# Patient Record
Sex: Female | Born: 1990 | Race: White | Hispanic: No | Smoking: Current every day smoker
Health system: Southern US, Community
[De-identification: ages and names within clinical notes are randomized; demographics above are authoritative.]

## PROBLEM LIST (undated history)

## (undated) DIAGNOSIS — A749 Chlamydial infection, unspecified: Secondary | ICD-10-CM

## (undated) DIAGNOSIS — G8929 Other chronic pain: Secondary | ICD-10-CM

## (undated) DIAGNOSIS — F329 Major depressive disorder, single episode, unspecified: Secondary | ICD-10-CM

## (undated) DIAGNOSIS — F419 Anxiety disorder, unspecified: Secondary | ICD-10-CM

## (undated) DIAGNOSIS — R519 Headache, unspecified: Secondary | ICD-10-CM

## (undated) DIAGNOSIS — F32A Depression, unspecified: Secondary | ICD-10-CM

## (undated) DIAGNOSIS — M542 Cervicalgia: Secondary | ICD-10-CM

## (undated) HISTORY — DX: Major depressive disorder, single episode, unspecified: F32.9

## (undated) HISTORY — DX: Anxiety disorder, unspecified: F41.9

## (undated) HISTORY — DX: Chlamydial infection, unspecified: A74.9

## (undated) HISTORY — DX: Depression, unspecified: F32.A

## (undated) HISTORY — PX: WISDOM TOOTH EXTRACTION: SHX21

---
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
## 2008-07-31 ENCOUNTER — Ambulatory Visit: Payer: Self-pay | Admitting: Family Medicine

## 2009-03-13 ENCOUNTER — Emergency Department: Payer: Self-pay | Admitting: Emergency Medicine

## 2010-05-18 ENCOUNTER — Ambulatory Visit: Payer: Self-pay | Admitting: Family Medicine

## 2010-07-12 ENCOUNTER — Ambulatory Visit: Payer: Self-pay | Admitting: Family Medicine

## 2010-12-23 ENCOUNTER — Ambulatory Visit: Payer: Self-pay | Admitting: Family Medicine

## 2011-09-02 ENCOUNTER — Ambulatory Visit: Payer: Self-pay | Admitting: Internal Medicine

## 2012-06-28 ENCOUNTER — Encounter: Payer: Self-pay | Admitting: Obstetrics and Gynecology

## 2012-07-26 ENCOUNTER — Encounter: Payer: Self-pay | Admitting: Obstetrics & Gynecology

## 2012-08-21 ENCOUNTER — Encounter: Payer: Self-pay | Admitting: Pediatrics

## 2012-09-15 ENCOUNTER — Observation Stay: Payer: Self-pay | Admitting: Obstetrics and Gynecology

## 2012-09-15 LAB — URINALYSIS, COMPLETE
Bilirubin,UR: NEGATIVE
Glucose,UR: NEGATIVE mg/dL (ref 0–75)
Ketone: NEGATIVE
Leukocyte Esterase: NEGATIVE
Ph: 7 (ref 4.5–8.0)
Protein: NEGATIVE
Squamous Epithelial: 3
WBC UR: 2 /HPF (ref 0–5)

## 2012-09-16 LAB — URINE CULTURE

## 2012-12-14 ENCOUNTER — Observation Stay: Payer: Self-pay | Admitting: Obstetrics and Gynecology

## 2012-12-19 ENCOUNTER — Observation Stay: Payer: Self-pay

## 2012-12-20 ENCOUNTER — Inpatient Hospital Stay: Payer: Self-pay

## 2012-12-20 LAB — CBC WITH DIFFERENTIAL/PLATELET
Basophil %: 0.8 %
HCT: 32.8 % — ABNORMAL LOW (ref 35.0–47.0)
Lymphocyte %: 14.5 %
MCH: 30.1 pg (ref 26.0–34.0)
Monocyte #: 1.5 x10 3/mm — ABNORMAL HIGH (ref 0.2–0.9)
Neutrophil #: 11.9 10*3/uL — ABNORMAL HIGH (ref 1.4–6.5)
Neutrophil %: 74.6 %
Platelet: 388 10*3/uL (ref 150–440)
RDW: 14.2 % (ref 11.5–14.5)
WBC: 15.9 10*3/uL — ABNORMAL HIGH (ref 3.6–11.0)

## 2012-12-21 LAB — HEMATOCRIT: HCT: 31.9 % — ABNORMAL LOW (ref 35.0–47.0)

## 2014-02-07 LAB — HM PAP SMEAR: HM Pap smear: NEGATIVE

## 2014-09-09 NOTE — H&P (Signed)
L&D Evaluation:  History:  HPI 38 week Intrauterine pregnancy for Labor check   Presents with contractions   Medications Pre Natal Vitamins   ROS:  ROS No PIH symptoms.   Exam:  Vital Signs stable   Pelvic 3 cm (no change)/50/-3   Mebranes Intact   FHT NST Reactive   FHT Description Variable decelerations   Ucx irregular   Other AFI 16.8 cm; VTX   Impression:  Impression reactive NST, Nomal AFI   Plan:  Plan Labor precautions.   Follow Up Appointment already scheduled. in 1 week   Electronic Signatures: DeFrancesco, Prentice DockerMartin A (MD)  (Signed 15-Aug-14 11:18)  Authored: L&D Evaluation   Last Updated: 15-Aug-14 11:18 by DeFrancesco, Prentice DockerMartin A (MD)

## 2014-09-09 NOTE — H&P (Signed)
L&D Evaluation:  History:  HPI 24yo SWF sent over from office in early labor at [id number] EGA, normal PNC to date, [id number]   Presents with contractions   Patient's Medical History No Chronic Illness   Patient's Surgical History none   Medications Pre Natal Vitamins  Tylenol (Acetaminophen)   Allergies NKDA   Social History tobacco   Family History Non-Contributory   ROS:  ROS All systems were reviewed.  HEENT, CNS, GI, GU, Respiratory, CV, Renal and Musculoskeletal systems were found to be normal.   Exam:  Vital Signs stable   Urine Protein not completed   General no apparent distress   Mental Status clear   Chest clear   Heart normal sinus rhythm   Abdomen gravid, tender with contractions   Estimated Fetal Weight Average for gestational age   Fetal Position vtx   Back no CVAT   Edema no edema   Reflexes 1+   Clonus negative   Pelvic 5/75/-1   Mebranes Ruptured   Description clear   FHT normal rate with no decels   Ucx irregular   Ucx Frequency 5 min   Length of each Contraction 60 seconds   Ucx Pain Scale 6   Impression:  Impression early labor   Plan:  Plan monitor contractions and for cervical change   Electronic Signatures: Ulyses AmorBurr, Tabor Bartram N (CNM)  (Signed 21-Aug-14 15:15)  Authored: L&D Evaluation   Last Updated: 21-Aug-14 15:15 by Ulyses AmorBurr, Stephania Macfarlane N (CNM)

## 2014-12-01 ENCOUNTER — Telehealth: Payer: Self-pay | Admitting: Obstetrics and Gynecology

## 2014-12-01 NOTE — Telephone Encounter (Signed)
WANTS TO TALK TO NURSE ABOUT AXNIETY ISSUES THAT'S KEEPING HER FROM EVERY DAY ACTIVITES, SHE SAID SHE WAS VERY EMOTIONALLY UNSTABLE AT THE MOMENT

## 2014-12-02 NOTE — Telephone Encounter (Signed)
Called pt she would like to be seen Gave pt 12/05/2014 @ 11:00, per pt she is stable at the moment and will contact us if needs something before

## 2014-12-03 ENCOUNTER — Encounter: Payer: Self-pay | Admitting: *Deleted

## 2014-12-04 ENCOUNTER — Encounter: Payer: Self-pay | Admitting: Obstetrics and Gynecology

## 2014-12-04 ENCOUNTER — Ambulatory Visit (INDEPENDENT_AMBULATORY_CARE_PROVIDER_SITE_OTHER): Payer: Medicaid Other | Admitting: Obstetrics and Gynecology

## 2014-12-04 VITALS — BP 104/77 | HR 68 | Ht 66.0 in | Wt 155.9 lb

## 2014-12-04 DIAGNOSIS — F419 Anxiety disorder, unspecified: Secondary | ICD-10-CM

## 2014-12-04 DIAGNOSIS — A499 Bacterial infection, unspecified: Secondary | ICD-10-CM

## 2014-12-04 DIAGNOSIS — N760 Acute vaginitis: Secondary | ICD-10-CM

## 2014-12-04 DIAGNOSIS — B9689 Other specified bacterial agents as the cause of diseases classified elsewhere: Secondary | ICD-10-CM

## 2014-12-04 MED ORDER — FLUOXETINE HCL 10 MG PO CAPS: 10.0000 mg | ORAL_CAPSULE | Freq: Every day | ORAL | Status: DC

## 2014-12-04 MED ORDER — METRONIDAZOLE 0.75 % VA GEL: 1.0000 | Freq: Every day | VAGINAL | Status: DC

## 2014-12-04 NOTE — Patient Instructions (Signed)
Generalized Anxiety Disorder Generalized anxiety disorder (GAD) is a mental disorder. It interferes with life functions, including relationships, work, and school. GAD is different from normal anxiety, which everyone experiences at some point in their lives in response to specific life events and activities. Normal anxiety actually helps us prepare for and get through these life events and activities. Normal anxiety goes away after the event or activity is over.  GAD causes anxiety that is not necessarily related to specific events or activities. It also causes excess anxiety in proportion to specific events or activities. The anxiety associated with GAD is also difficult to control. GAD can vary from mild to severe. People with severe GAD can have intense waves of anxiety with physical symptoms (panic attacks).  SYMPTOMS The anxiety and worry associated with GAD are difficult to control. This anxiety and worry are related to many life events and activities and also occur more days than not for 6 months or longer. People with GAD also have three or more of the following symptoms (one or more in children):  Restlessness.   Fatigue.  Difficulty concentrating.   Irritability.  Muscle tension.  Difficulty sleeping or unsatisfying sleep. DIAGNOSIS GAD is diagnosed through an assessment by your health care provider. Your health care provider will ask you questions aboutyour mood,physical symptoms, and events in your life. Your health care provider may ask you about your medical history and use of alcohol or drugs, including prescription medicines. Your health care provider may also do a physical exam and blood tests. Certain medical conditions and the use of certain substances can cause symptoms similar to those associated with GAD. Your health care provider may refer you to a mental health specialist for further evaluation. TREATMENT The following therapies are usually used to treat GAD:    Medication. Antidepressant medication usually is prescribed for long-term daily control. Antianxiety medicines may be added in severe cases, especially when panic attacks occur.   Talk therapy (psychotherapy). Certain types of talk therapy can be helpful in treating GAD by providing support, education, and guidance. A form of talk therapy called cognitive behavioral therapy can teach you healthy ways to think about and react to daily life events and activities.  Stress managementtechniques. These include yoga, meditation, and exercise and can be very helpful when they are practiced regularly. A mental health specialist can help determine which treatment is best for you. Some people see improvement with one therapy. However, other people require a combination of therapies. Document Released: 08/13/2012 Document Revised: 09/02/2013 Document Reviewed: 08/13/2012 ExitCare Patient Information 2015 [employer]. This information is not intended to replace advice given to you by your health care provider. Make sure you discuss any questions you have with your health care provider.  

## 2014-12-04 NOTE — Progress Notes (Signed)
Subjective:     Patient ID: Veronica Harmon, female   DOB: Jun 25, 1990, 24 y.o.   MRN: [id number]  HPI  Reports worsening anxiety x 2-3 months, now occuring daily with multiple panic attacks over last months, worse at night, affecting her ability to fall asleep, and function. Has to leave where she is and go home when it flares up. Took 1/2 a xanax and made her feel 'drunk' so hasn't taken anymore.  Reports feeling like her entire body is tingling and can't breathe during panic attacks- can last 30 minutes to hours. Denies any exacerbating factors. Review of Systems See HPI Also reports increased vaginal white discharge with odor x 2 weeks    Objective:   Physical Exam A&Ox4 Well groomed female in no apparent distress Appropriate affect Pelvic exam: normal external genitalia, vulva, vagina, cervix, uterus and adnexa, WET MOUNT done - results: KOH done, clue cells, excessive bacteria.     Assessment:     Generalized anxiety with panic episodes BV     Plan:     Counseled on treatment options and will start on Prozac  nightly with RTC in 5 weeks to reassess. Rx for Metrogel sent in  [place], [place]

## 2014-12-05 ENCOUNTER — Ambulatory Visit: Payer: Self-pay | Admitting: Obstetrics and Gynecology

## 2014-12-16 ENCOUNTER — Telehealth: Payer: Self-pay | Admitting: Obstetrics and Gynecology

## 2014-12-16 NOTE — Telephone Encounter (Signed)
Spoke w/pt she is tolerating the medication well, would like to increase to  advised ok Has follow-up appt with MNB

## 2014-12-16 NOTE — Telephone Encounter (Signed)
Patient called requesting a call back from you. Thanks °

## 2014-12-18 NOTE — Telephone Encounter (Signed)
done

## 2014-12-24 ENCOUNTER — Other Ambulatory Visit: Payer: Self-pay | Admitting: *Deleted

## 2014-12-24 ENCOUNTER — Telehealth: Payer: Self-pay | Admitting: Obstetrics and Gynecology

## 2014-12-24 MED ORDER — FLUOXETINE HCL 20 MG PO CAPS: 20.0000 mg | ORAL_CAPSULE | Freq: Every day | ORAL | Status: DC

## 2014-12-24 NOTE — Telephone Encounter (Signed)
PT CALLED AND WANTED YOU TO CALL HER BACK

## 2014-12-24 NOTE — Telephone Encounter (Signed)
rx sent to pharmacy for Prozac , pt is doing much better

## 2015-01-08 ENCOUNTER — Encounter: Payer: Self-pay | Admitting: Obstetrics and Gynecology

## 2015-01-08 ENCOUNTER — Ambulatory Visit (INDEPENDENT_AMBULATORY_CARE_PROVIDER_SITE_OTHER): Payer: Medicaid Other | Admitting: Obstetrics and Gynecology

## 2015-01-08 VITALS — BP 122/77 | HR 88 | Ht 65.0 in | Wt 149.5 lb

## 2015-01-08 DIAGNOSIS — F419 Anxiety disorder, unspecified: Secondary | ICD-10-CM | POA: Diagnosis not present

## 2015-01-08 NOTE — Progress Notes (Signed)
Subjective:     Patient ID: Veronica Harmon, female   DOB: [birth year], 24 y.o.   MRN: [id number]  HPI Seen today for medication recheck, started on prozac 6 weeks ago for panic attacks and anxiety disordefr  Review of Systems States feeling much better since increased dose to , and has only had a few mild panic attacks, is in therapy and feels like that is helping. Has not taken xanax. Desires to continue meds    Objective:   Physical Exam A&O x4, not anxious Thought processes intact    Assessment:     Anxiety- under good control with SSRI     Plan:     Will continue Prozac at 20 mg  For at least 6 months, then re-evaluate prn.  RTC as needed.  Melody Ines Bloomer, CNM

## 2015-04-24 ENCOUNTER — Ambulatory Visit (INDEPENDENT_AMBULATORY_CARE_PROVIDER_SITE_OTHER): Payer: Medicaid Other | Admitting: Obstetrics and Gynecology

## 2015-04-24 ENCOUNTER — Encounter: Payer: Self-pay | Admitting: Obstetrics and Gynecology

## 2015-04-24 VITALS — BP 107/72 | HR 81 | Wt 142.9 lb

## 2015-04-24 DIAGNOSIS — N760 Acute vaginitis: Secondary | ICD-10-CM | POA: Diagnosis not present

## 2015-04-24 DIAGNOSIS — A499 Bacterial infection, unspecified: Secondary | ICD-10-CM | POA: Diagnosis not present

## 2015-04-24 DIAGNOSIS — B9689 Other specified bacterial agents as the cause of diseases classified elsewhere: Secondary | ICD-10-CM

## 2015-04-24 MED ORDER — FLUOXETINE HCL 20 MG PO CAPS: 20.0000 mg | ORAL_CAPSULE | Freq: Every day | ORAL | Status: DC

## 2015-04-24 MED ORDER — METRONIDAZOLE 0.75 % VA GEL: 1.0000 | Freq: Every day | VAGINAL | Status: DC

## 2015-04-24 NOTE — Progress Notes (Signed)
Patient ID: Veronica MessierJessalyn Rolston, female   DOB: 1990-11-16, 24 y.o.   MRN: [id number] CHIEF COMPLAINT/HPI:  24 y.o. female complains of white, copious and malodorous vaginal discharge for 7  day(s). Denies abnormal vaginal bleeding, significant pelvic pain or fever. No UTI symptoms. Sexually active, does not use condoms, no change in partner.  Last unprotected intercourse __ days ago.  Denies history of known exposure to STD or symptoms in partner.  Patient's last menstrual period was 03/26/2015.  No history of STD's.  Review of Systems  Constitutional: Negative for fever and chills Eyes: Negative for visual disturbances Respiratory: Negative for shortness of breath, dyspnea Cardiovascular: Negative for chest pain or palpitations  Gastrointestinal: Negative for vomiting, diarrhea and constipation Genitourinary: Negative for dysuria and urgency Musculoskeletal: Negative for back pain, joint pain, myalgias  Neurological: Negative for dizziness and headaches    Past Medical History: Past Medical History  Diagnosis Date  . Chlamydia infection   . Anxiety   . Depression     Past Surgical History: History reviewed. No pertinent past surgical history.  Obstetrical History: OB History    Gravida Para Term Preterm AB TAB SAB Ectopic Multiple Living   0               Gynecological History: Pertinent Gynecological History: Menses: usually lasting less than 6 days and with minimal cramping Bleeding: none Contraception: Nexplanon DES exposure: denies Blood transfusions: none Sexually transmitted diseases: no past history Previous GYN Procedures: none    Social History: Social History   Social History  . Marital Status: Divorced    Spouse Name: N/A  . Number of Children: N/A  . Years of Education: N/A   Social History Main Topics  . Smoking status: Current Every Day Smoker  . Smokeless tobacco: Never Used  . Alcohol Use: Yes     Comment: occas  . Drug Use: No  . Sexual  Activity: Yes    Birth Control/ Protection: Implant   Other Topics Concern  . None   Social History Narrative    Family History: Family History  Problem Relation Age of Onset  . Cancer Paternal Aunt     breast    Allergies: Allergies  Allergen Reactions  . Allyl Isothiocyanate Hives  . Sulfa Antibiotics         PHYSICAL EXAM: Pelvic - normal external genitalia, vulva, vagina, cervix, uterus and adnexa, WET MOUNT done - results: KOH done, clue cells   Labs: No results found for this or any previous visit (from the past 24 hour(s)). wetprep see above Assessment: There are no active problems to display for this patient. BV Plan:  No orders of the defined types were placed in this encounter.   Clindesse sample given and metrogel rx refilled.  Melody Suzan NailerN Shambley, CNM

## 2015-07-06 ENCOUNTER — Telehealth: Payer: Self-pay | Admitting: Obstetrics and Gynecology

## 2015-07-06 ENCOUNTER — Other Ambulatory Visit: Payer: Medicaid Other

## 2015-07-06 ENCOUNTER — Other Ambulatory Visit: Payer: Self-pay | Admitting: *Deleted

## 2015-07-06 DIAGNOSIS — N926 Irregular menstruation, unspecified: Secondary | ICD-10-CM

## 2015-07-06 NOTE — Telephone Encounter (Signed)
Called pt left detailed message she may come to lab today

## 2015-07-06 NOTE — Telephone Encounter (Signed)
Pt has nexplanon implant and has had symptoms of pregnancy. She has took 3 pregnancy test all positive. She is worried b/c she is on Prozac.. Can she have serum blood test today. She is at work but will get off at 1 pm so if you call you can leave a msg.

## 2015-07-07 ENCOUNTER — Other Ambulatory Visit: Payer: Medicaid Other

## 2015-07-07 ENCOUNTER — Other Ambulatory Visit: Payer: Self-pay

## 2015-07-07 DIAGNOSIS — N926 Irregular menstruation, unspecified: Secondary | ICD-10-CM

## 2015-07-07 LAB — BETA HCG QUANT (REF LAB): hCG Quant: <1 m[IU]/mL

## 2015-08-03 ENCOUNTER — Telehealth: Payer: Self-pay | Admitting: Obstetrics and Gynecology

## 2015-08-03 NOTE — Telephone Encounter (Signed)
Pt needs note for school saying you have a panic disorder. She wants you to call her before you do it.

## 2015-08-04 NOTE — Telephone Encounter (Signed)
Notified pt note for school is ready

## 2015-12-10 ENCOUNTER — Encounter: Payer: Self-pay | Admitting: Obstetrics and Gynecology

## 2015-12-10 ENCOUNTER — Ambulatory Visit (INDEPENDENT_AMBULATORY_CARE_PROVIDER_SITE_OTHER): Payer: Medicaid Other | Admitting: Obstetrics and Gynecology

## 2015-12-10 VITALS — BP 118/78 | HR 85 | Ht 66.0 in | Wt 152.1 lb

## 2015-12-10 DIAGNOSIS — F419 Anxiety disorder, unspecified: Secondary | ICD-10-CM

## 2015-12-10 DIAGNOSIS — N926 Irregular menstruation, unspecified: Secondary | ICD-10-CM

## 2015-12-10 LAB — BETA HCG QUANT (REF LAB)

## 2015-12-10 LAB — POCT URINE PREGNANCY: Preg Test, Ur: NEGATIVE

## 2015-12-10 MED ORDER — ALPRAZOLAM 0.5 MG PO TABS: 0.5000 mg | ORAL_TABLET | Freq: Three times a day (TID) | ORAL | 1 refills | Status: DC | PRN

## 2015-12-10 NOTE — Progress Notes (Signed)
Subjective:     Patient ID: Veronica Harmon, female   DOB: 21-Jul-1990, 25 y.o.   MRN: [id number]  HPI Here for nexplanon replacement, but concerned about possible pregnanncy- feels fine, but to clindamycin x 2 days for preventive therapy after stepping on nail and had 3 + UPT at home last week. LMP was > 3 months ago. But has not had regular menses since Nexplanon insertion 3 years ago.  Review of Systems See above    Objective:   Physical Exam A&Ox4  well groomed female in no distress Blood pressure 118/78, pulse 85, height 5\' 6"  (1.676 m), weight 152 lb 1.6 oz (69 kg). UPT - pelvic exam not indicated    Assessment:     Missed menses with nexplanon use    Plan:     Quant Bhcg obtained today, will return next week for nexplanon replacement if test negative. Reassured.  Veronica Harmon, CNM

## 2016-02-03 ENCOUNTER — Ambulatory Visit (INDEPENDENT_AMBULATORY_CARE_PROVIDER_SITE_OTHER): Payer: Medicaid Other | Admitting: Obstetrics and Gynecology

## 2016-02-03 ENCOUNTER — Encounter: Payer: Self-pay | Admitting: Obstetrics and Gynecology

## 2016-02-03 ENCOUNTER — Other Ambulatory Visit: Payer: Self-pay | Admitting: Obstetrics and Gynecology

## 2016-02-03 VITALS — BP 115/81 | HR 78 | Ht 66.0 in | Wt 157.9 lb

## 2016-02-03 DIAGNOSIS — Z30017 Encounter for initial prescription of implantable subdermal contraceptive: Secondary | ICD-10-CM

## 2016-02-03 DIAGNOSIS — Z3046 Encounter for surveillance of implantable subdermal contraceptive: Secondary | ICD-10-CM | POA: Diagnosis not present

## 2016-02-03 MED ORDER — ALPRAZOLAM 1 MG PO TABS: 1.0000 mg | ORAL_TABLET | Freq: Three times a day (TID) | ORAL | 3 refills | Status: DC | PRN

## 2016-02-03 NOTE — Progress Notes (Signed)
Veronica Harmon is a 25 y.o. year old 292P0 Caucasian female here for Nexplanon removal and reinsertion.  She was given informed consent for removal and reinsertion of her Nexplanon. Her Nexplanon was placed 2014, No LMP recorded., and her pregnancy test today was negative.   Risks/benefits/side effects of Nexplanon have been discussed and her questions have been answered.  Specifically, a failure rate of 05/998 has been reported, with an increased failure rate if pt takes St. John's Wort and/or antiseizure medicaitons.  Veronica MessierJessalyn Bedwell is aware of the common side effect of irregular bleeding, which the incidence of decreases over time.  BP 115/81   Pulse 78   Ht 5\' 6"  (1.676 m)   Wt 157 lb 14.4 oz (71.6 kg)   BMI 25.49 kg/m  No LMP recorded. No results found for this or any previous visit (from the past 24 hour(s)).   Appropriate time out taken. Nexplanon site identified.  Area prepped in usual sterile fashon. Two cc's of 2% lidocaine was used to anesthetize the area. A small stab incision was made right beside the implant on the distal portion.  The Nexplanon rod was grasped using hemostats and removed intact without difficulty.  The area was cleansed again with betadine and the Nexplanon was inserted per manufacturer's recommendations without difficulty.  Steri-strips and a pressure bandage was applied.  There was less than 3 cc blood loss. There were no complications.  The patient tolerated the procedure well.  She was instructed to keep the area clean and dry, remove pressure bandage in 24 hours, and keep insertion site covered with the steri-strips for 3-5 days.  She was given a card indicating date Nexplanon was inserted and date it needs to be removed.   Follow-up PRN problems.  Dajuana Palen Suzan NailerN Emet Rafanan, CNM

## 2016-07-20 ENCOUNTER — Telehealth: Payer: Self-pay | Admitting: Obstetrics and Gynecology

## 2016-07-20 NOTE — Telephone Encounter (Signed)
Pt states has 2 pills left and zero refills remain. Pt uses [facility]. Does pt need appt first? pleare advise.

## 2016-07-21 ENCOUNTER — Telehealth: Payer: Self-pay | Admitting: *Deleted

## 2016-07-21 ENCOUNTER — Other Ambulatory Visit: Payer: Self-pay | Admitting: *Deleted

## 2016-07-21 MED ORDER — ALPRAZOLAM 1 MG PO TABS: 1.0000 mg | ORAL_TABLET | Freq: Three times a day (TID) | ORAL | 3 refills | Status: DC | PRN

## 2016-07-21 MED ORDER — FLUOXETINE HCL 20 MG PO CAPS: 20.0000 mg | ORAL_CAPSULE | Freq: Every day | ORAL | 3 refills | Status: DC

## 2016-07-21 NOTE — Telephone Encounter (Signed)
Done-ac 

## 2016-07-21 NOTE — Telephone Encounter (Signed)
Patient called and the medication she needs refill of is the Prozac and the xanax . Please advise. Thank you .

## 2016-09-02 ENCOUNTER — Other Ambulatory Visit: Payer: Medicaid Other

## 2016-09-02 ENCOUNTER — Telehealth: Payer: Self-pay | Admitting: Obstetrics and Gynecology

## 2016-09-02 ENCOUNTER — Other Ambulatory Visit: Payer: Self-pay | Admitting: *Deleted

## 2016-09-02 DIAGNOSIS — N926 Irregular menstruation, unspecified: Secondary | ICD-10-CM

## 2016-09-02 NOTE — Telephone Encounter (Signed)
Called pt she is coming in 09/02/16 @ 1:45

## 2016-09-02 NOTE — Telephone Encounter (Signed)
Patient called wanting a (blood pregnancy test) Patient has taken several at home pregnancy tests and all of the results have "faint lines", Patient wants to confirm possible pregnancy as soon as possible. Please advise.

## 2016-09-03 LAB — BETA HCG QUANT (REF LAB)

## 2016-09-05 ENCOUNTER — Telehealth: Payer: Self-pay | Admitting: Obstetrics and Gynecology

## 2016-09-05 NOTE — Telephone Encounter (Signed)
Please call patient with lab results

## 2016-09-06 NOTE — Telephone Encounter (Signed)
Notified pt of results 

## 2016-09-13 ENCOUNTER — Telehealth: Payer: Self-pay | Admitting: Obstetrics and Gynecology

## 2016-09-13 NOTE — Telephone Encounter (Signed)
Patient called requesting a dosage increase on prozac. Please Advise.

## 2016-09-13 NOTE — Telephone Encounter (Signed)
pls advise

## 2016-09-14 ENCOUNTER — Other Ambulatory Visit: Payer: Self-pay | Admitting: Obstetrics and Gynecology

## 2016-09-14 MED ORDER — FLUOXETINE HCL 20 MG PO CAPS: 20.0000 mg | ORAL_CAPSULE | Freq: Every day | ORAL | 3 refills | Status: DC

## 2016-09-14 MED ORDER — FLUOXETINE HCL 10 MG PO CAPS: 10.0000 mg | ORAL_CAPSULE | Freq: Every day | ORAL | 4 refills | Status: DC

## 2016-09-14 NOTE — Telephone Encounter (Signed)
Please let her know I sent in another RX for 30mg  total, which means she will have to take 2 pills a day, one 20mg  and one 10mg .

## 2016-09-14 NOTE — Telephone Encounter (Signed)
Notified pt. 

## 2016-11-29 ENCOUNTER — Telehealth: Payer: Self-pay | Admitting: Obstetrics and Gynecology

## 2016-11-29 ENCOUNTER — Other Ambulatory Visit: Payer: Self-pay | Admitting: Obstetrics and Gynecology

## 2016-11-29 NOTE — Telephone Encounter (Signed)
Patient lvm stating that she is "Mel's" patient, and a call back number, I called the patient back and lvm to let the patient know to give us a call back, so we are able to help her with what ever she may have any concerns with. Thank you.

## 2016-12-05 ENCOUNTER — Other Ambulatory Visit: Payer: Self-pay | Admitting: *Deleted

## 2016-12-05 ENCOUNTER — Telehealth: Payer: Self-pay | Admitting: Obstetrics and Gynecology

## 2016-12-05 MED ORDER — ALPRAZOLAM 1 MG PO TABS: 1.0000 mg | ORAL_TABLET | Freq: Three times a day (TID) | ORAL | 3 refills | Status: DC | PRN

## 2016-12-05 NOTE — Telephone Encounter (Signed)
Patient lvm to get a medication refill, Patient would like to be called back by a nurse. Patient did not disclose any other information. Please advise.

## 2016-12-05 NOTE — Telephone Encounter (Signed)
Patient needs refill on [facility]anax at [facility]

## 2016-12-05 NOTE — Telephone Encounter (Signed)
Put on mns desk for approval

## 2017-03-06 ENCOUNTER — Other Ambulatory Visit: Payer: Self-pay | Admitting: Obstetrics and Gynecology

## 2017-03-23 ENCOUNTER — Emergency Department
Admission: EM | Admit: 2017-03-23 | Discharge: 2017-03-23 | Disposition: A | Discharge status: Home / Self Care | Payer: Medicaid Other | Attending: Emergency Medicine | Admitting: Emergency Medicine

## 2017-03-23 ENCOUNTER — Encounter: Payer: Self-pay | Admitting: Emergency Medicine

## 2017-03-23 ENCOUNTER — Other Ambulatory Visit: Payer: Self-pay

## 2017-03-23 DIAGNOSIS — E860 Dehydration: Secondary | ICD-10-CM | POA: Insufficient documentation

## 2017-03-23 DIAGNOSIS — R112 Nausea with vomiting, unspecified: Secondary | ICD-10-CM

## 2017-03-23 DIAGNOSIS — Z79899 Other long term (current) drug therapy: Secondary | ICD-10-CM | POA: Insufficient documentation

## 2017-03-23 DIAGNOSIS — F17210 Nicotine dependence, cigarettes, uncomplicated: Secondary | ICD-10-CM | POA: Diagnosis not present

## 2017-03-23 DIAGNOSIS — R1110 Vomiting, unspecified: Secondary | ICD-10-CM | POA: Diagnosis present

## 2017-03-23 DIAGNOSIS — R197 Diarrhea, unspecified: Secondary | ICD-10-CM

## 2017-03-23 LAB — LIPASE, BLOOD: Lipase: 20 U/L (ref 11–51)

## 2017-03-23 LAB — CBC
HCT: 43.3 % (ref 35.0–47.0)
Hemoglobin: 14.9 g/dL (ref 12.0–16.0)
MCH: 31.3 pg (ref 26.0–34.0)
MCHC: 34.4 g/dL (ref 32.0–36.0)
MCV: 91.2 fL (ref 80.0–100.0)
Platelets: 278 10*3/uL (ref 150–440)
RBC: 4.75 MIL/uL (ref 3.80–5.20)
RDW: 12.5 % (ref 11.5–14.5)
WBC: 11.3 10*3/uL — ABNORMAL HIGH (ref 3.6–11.0)

## 2017-03-23 LAB — COMPREHENSIVE METABOLIC PANEL
ALBUMIN: 4.2 g/dL (ref 3.5–5.0)
ALK PHOS: 103 U/L (ref 38–126)
ALT: 10 U/L — AB (ref 14–54)
AST: 17 U/L (ref 15–41)
Anion gap: 10 (ref 5–15)
BILIRUBIN TOTAL: 1.6 mg/dL — AB (ref 0.3–1.2)
BUN: 13 mg/dL (ref 6–20)
CALCIUM: 8.7 mg/dL — AB (ref 8.9–10.3)
CO2: 20 mmol/L — ABNORMAL LOW (ref 22–32)
Chloride: 105 mmol/L (ref 101–111)
Creatinine, Ser: 0.85 mg/dL (ref 0.44–1.00)
GFR calc Af Amer: >60 mL/min (ref >60)
GFR calc non Af Amer: >60 mL/min (ref >60)
GLUCOSE: 117 mg/dL — AB (ref 65–99)
Potassium: 3.4 mmol/L — ABNORMAL LOW (ref 3.5–5.1)
Sodium: 135 mmol/L (ref 135–145)
TOTAL PROTEIN: 6.9 g/dL (ref 6.5–8.1)

## 2017-03-23 LAB — HCG, QUANTITATIVE, PREGNANCY

## 2017-03-23 MED ORDER — SODIUM CHLORIDE 0.9 % IV BOLUS (SEPSIS)
1000.0000 mL | Freq: Once | INTRAVENOUS | Status: AC
  Administered 2017-03-23: 1000 mL via INTRAVENOUS

## 2017-03-23 MED ORDER — LOPERAMIDE HCL 2 MG PO CAPS
2.0000 mg | ORAL_CAPSULE | Freq: Once | ORAL | Status: AC
  Administered 2017-03-23: 2 mg via ORAL
  Filled 2017-03-23: qty 1

## 2017-03-23 MED ORDER — ONDANSETRON 4 MG PO TBDP: 4.0000 mg | ORAL_TABLET | Freq: Three times a day (TID) | ORAL | 0 refills | Status: DC | PRN

## 2017-03-23 MED ORDER — ONDANSETRON HCL 4 MG/2ML IJ SOLN
4.0000 mg | Freq: Once | INTRAMUSCULAR | Status: AC
  Administered 2017-03-23: 4 mg via INTRAVENOUS
  Filled 2017-03-23: qty 2

## 2017-03-23 NOTE — Discharge Instructions (Signed)
Please make sure you remain well-hydrated and take your nausea medication as needed for severe symptoms.  I fully anticipate that your symptoms should be gone within the next 24 hours.  If they last longer please return to the emergency department for further evaluation.  Return to the ER sooner for any concerns whatsoever.  It was a pleasure to take care of you today, and thank you for coming to our emergency department.  If you have any questions or concerns before leaving please ask the nurse to grab me and I'm more than happy to go through your aftercare instructions again.  If you were prescribed any opioid pain medication today such as Norco, Vicodin, Percocet, morphine, hydrocodone, or oxycodone please make sure you do not drive when you are taking this medication as it can alter your ability to drive safely.  If you have any concerns once you are home that you are not improving or are in fact getting worse before you can make it to your follow-up appointment, please do not hesitate to call 911 and come back for further evaluation.  Merrily BrittleNeil Chandni Gagan, MD  Results for orders placed or performed during the hospital encounter of 03/23/17  Lipase, blood  Result Value Ref Range   Lipase 20 11 - 51 U/L  Comprehensive metabolic panel  Result Value Ref Range   Sodium 135 135 - 145 mmol/L   Potassium 3.4 (L) 3.5 - 5.1 mmol/L   Chloride 105 101 - 111 mmol/L   CO2 20 (L) 22 - 32 mmol/L   Glucose, Bld 117 (H) 65 - 99 mg/dL   BUN 13 6 - 20 mg/dL   Creatinine, Ser 7.420.85 0.44 - 1.00 mg/dL   Calcium 8.7 (L) 8.9 - 10.3 mg/dL   Total Protein 6.9 6.5 - 8.1 g/dL   Albumin 4.2 3.5 - 5.0 g/dL   AST 17 15 - 41 U/L   ALT 10 (L) 14 - 54 U/L   Alkaline Phosphatase 103 38 - 126 U/L   Total Bilirubin 1.6 (H) 0.3 - 1.2 mg/dL   GFR calc non Af Amer >60 >60 mL/min   GFR calc Af Amer >60 >60 mL/min   Anion gap 10 5 - 15  CBC  Result Value Ref Range   WBC 11.3 (H) 3.6 - 11.0 K/uL   RBC 4.75 3.80 - 5.20  MIL/uL   Hemoglobin 14.9 12.0 - 16.0 g/dL   HCT 59.543.3 63.835.0 - 75.647.0 %   MCV 91.2 80.0 - 100.0 fL   MCH 31.3 26.0 - 34.0 pg   MCHC 34.4 32.0 - 36.0 g/dL   RDW 43.312.5 29.511.5 - 18.814.5 %   Platelets 278 150 - 440 K/uL  hCG, quantitative, pregnancy  Result Value Ref Range   hCG, Beta Chain, Quant, S <1 <5 mIU/mL

## 2017-03-23 NOTE — ED Triage Notes (Signed)
Nausea, vomiting and diarrhea x 2 days. Lower abd pain.

## 2017-03-23 NOTE — ED Notes (Signed)

## 2017-03-23 NOTE — ED Provider Notes (Signed)
[facility] Emergency Department Provider Note  ____________________________________________   First MD Initiated Contact with Patient 03/23/17 2026     (approximate)  I have reviewed the triage vital signs and the nursing notes.   HISTORY  Chief Complaint Abdominal Pain and Emesis    HPI Veronica MessierJessalyn Harmon is a 26 y.o. female who self presents to the emergency department with roughly 48 hours of nausea vomiting diarrhea and mild to moderate cramping abdominal discomfort.  Her niece and nephew at home had identical symptoms several days before.  She vomited roughly 6-7 times today and has had 6-7 does not remember the last time she had antibiotic.  Today her symptoms were improved after receiving IV fluids.  Shortly after arriving to the emergency department she stood up suddenly and had a syncopal event.  She is concerned because she has not been able to keep food down and she is worried she needs IV fluids.  Her symptoms began insidiously and has been actually slowly improving with time.  Past Medical History:  Diagnosis Date  . Anxiety   . Chlamydia infection   . Depression     Patient Active Problem List   Diagnosis Date Noted  . Anxiety 12/10/2015    History reviewed. No pertinent surgical history.  Prior to Admission medications   Medication Sig Start Date End Date Taking? Authorizing Provider  acyclovir (ZOVIRAX) 400 MG tablet Take 400 mg by mouth 2 (two) times daily.    [provider]  ALPRAZolam Prudy Feeler(XANAX) 1 MG tablet Take 1 tablet (1 mg total) by mouth 3 (three) times daily as needed. 12/05/16   Shambley, Melody N, CNM  etonogestrel (NEXPLANON) 68 MG IMPL implant 1 each by Subdermal route once.    [provider]  FLUoxetine (PROZAC) 10 MG capsule TAKE ONE CAPSULE BY MOUTH EVERY DAY 03/07/17   Shambley, Melody N, CNM  FLUoxetine (PROZAC) 20 MG capsule Take 1 capsule (20 mg total) by mouth daily. 09/14/16   Shambley, Melody N, CNM    ISOtretinoin (ACCUTANE) 20 MG capsule Take 20 mg by mouth 2 (two) times daily.    [provider]  metroNIDAZOLE (METROGEL) 0.75 % vaginal gel Place 1 Applicatorful vaginally at bedtime. Apply one applicatorful to vagina at bedtime for 5 days Patient not taking: Reported on 02/03/2016 04/24/15   Shambley, Melody N, CNM  ondansetron (ZOFRAN ODT) 4 MG disintegrating tablet Take 1 tablet (4 mg total) by mouth every 8 (eight) hours as needed for nausea or vomiting. 03/23/17   Merrily Brittleifenbark, [name], MD    Allergies Allyl isothiocyanate and Sulfa antibiotics  Family History  Problem Relation Age of Onset  . Cancer Paternal Aunt        breast    Social History Social History   Tobacco Use  . Smoking status: Current Every Day Smoker    Packs/day: 0.50    Types: Cigarettes  . Smokeless tobacco: Never Used  Substance Use Topics  . Alcohol use: Yes    Comment: occas  . Drug use: No    Review of Systems Constitutional: No fever/chills Eyes: No visual changes. ENT: No sore throat. Cardiovascular: Denies chest pain. Respiratory: Denies shortness of breath. Gastrointestinal: Positive for abdominal pain.  Positive for nausea, positive for vomiting.  Positive for diarrhea.  No constipation. Genitourinary: Negative for dysuria. Musculoskeletal: Negative for back pain. Skin: Negative for rash. Neurological: Negative for headaches, focal weakness or numbness.   ____________________________________________   PHYSICAL EXAM:  VITAL SIGNS: ED Triage  Vitals  Enc Vitals Group     BP 03/23/17 1823 108/68     Pulse Rate 03/23/17 1823 64     Resp 03/23/17 1823 20     Temp 03/23/17 1823 99.1 F (37.3 C)     Temp Source 03/23/17 1823 Oral     SpO2 03/23/17 1823 97 %     Weight 03/23/17 1824 160 lb (72.6 kg)     Height 03/23/17 1824 5\' 6"  (1.676 m)     Head Circumference --      Peak Flow --      Pain Score 03/23/17 1823 3     Pain Loc --      Pain Edu? --      Excl. in GC? --      Constitutional: Alert and oriented x4 pleasant cooperative speaks full clear sentences no diaphoresis Eyes: PERRL EOMI. Head: Atraumatic. Nose: No congestion/rhinnorhea. Mouth/Throat: No trismus Neck: No stridor.   Cardiovascular: Normal rate, regular rhythm. Grossly normal heart sounds.  Good peripheral circulation. Respiratory: Normal respiratory effort.  No retractions. Lungs CTAB and moving good air Gastrointestinal: Soft nondistended nontender no rebound or guarding no peritonitis no McBurney's tenderness no dressings no costovertebral tenderness Musculoskeletal: No lower extremity edema   Neurologic:  Normal speech and language. No gross focal neurologic deficits are appreciated. Skin:  Skin is warm, dry and intact. No rash noted. Psychiatric: Mood and affect are normal. Speech and behavior are normal.    ____________________________________________   DIFFERENTIAL includes but not limited to  Appendicitis, diverticulitis, ectopic pregnancy, pyelonephritis, ovarian torsion, gastroenteritis ____________________________________________   LABS (all labs ordered are listed, but only abnormal results are displayed)  Labs Reviewed  COMPREHENSIVE METABOLIC PANEL - Abnormal; Notable for the following components:      Result Value   Potassium 3.4 (*)    CO2 20 (*)    Glucose, Bld 117 (*)    Calcium 8.7 (*)    ALT 10 (*)    Total Bilirubin 1.6 (*)    All other components within normal limits  CBC - Abnormal; Notable for the following components:   WBC 11.3 (*)    All other components within normal limits  LIPASE, BLOOD  HCG, QUANTITATIVE, PREGNANCY    Blood work reviewed by me shows clinically insignificant hypokalemia.  Elevated white count is nonspecific likely secondary to  stress __________________________________________  EKG   ____________________________________________  RADIOLOGY   ____________________________________________   PROCEDURES  Procedure(s) performed: no  Procedures  Critical Care performed: no  Observation: no ____________________________________________   INITIAL IMPRESSION / ASSESSMENT AND PLAN / ED COURSE  Pertinent labs & imaging results that were available during my care of the patient were reviewed by me and considered in my medical decision making (see chart for details).  By the time I saw the patient she had already had a liter of fluids and felt markedly improved.  Given an additional liter of fluid and Zofran and she feels back to her normal self.  At this point she likely has a self-limited viral illness and will be discharged home with symptomatic treatment and primary care follow-up.  Strict return precautions have been given and patient verbalized understanding and agreed with the plan.      ____________________________________________   FINAL CLINICAL IMPRESSION(S) / ED DIAGNOSES  Final diagnoses:  Nausea vomiting and diarrhea  Dehydration      NEW MEDICATIONS STARTED DURING THIS VISIT:  This SmartLink is deprecated. Use AVSMEDLIST instead to display the medication list  for a patient.   Note:  This document was prepared using Dragon voice recognition software and may include unintentional dictation errors.     Merrily Brittle, MD 03/24/17 1123

## 2017-05-09 ENCOUNTER — Other Ambulatory Visit: Payer: Self-pay

## 2017-05-09 ENCOUNTER — Emergency Department
Admission: EM | Admit: 2017-05-09 | Discharge: 2017-05-09 | Disposition: A | Discharge status: Home / Self Care | Payer: Medicaid Other | Attending: Emergency Medicine | Admitting: Emergency Medicine

## 2017-05-09 ENCOUNTER — Encounter: Payer: Self-pay | Admitting: *Deleted

## 2017-05-09 DIAGNOSIS — F17210 Nicotine dependence, cigarettes, uncomplicated: Secondary | ICD-10-CM | POA: Insufficient documentation

## 2017-05-09 DIAGNOSIS — F419 Anxiety disorder, unspecified: Secondary | ICD-10-CM | POA: Insufficient documentation

## 2017-05-09 DIAGNOSIS — M436 Torticollis: Secondary | ICD-10-CM | POA: Diagnosis not present

## 2017-05-09 DIAGNOSIS — M542 Cervicalgia: Secondary | ICD-10-CM | POA: Diagnosis present

## 2017-05-09 DIAGNOSIS — Z79899 Other long term (current) drug therapy: Secondary | ICD-10-CM | POA: Diagnosis not present

## 2017-05-09 DIAGNOSIS — F329 Major depressive disorder, single episode, unspecified: Secondary | ICD-10-CM | POA: Insufficient documentation

## 2017-05-09 DIAGNOSIS — M5412 Radiculopathy, cervical region: Secondary | ICD-10-CM | POA: Insufficient documentation

## 2017-05-09 MED ORDER — MELOXICAM 15 MG PO TABS: 15.0000 mg | ORAL_TABLET | Freq: Every day | ORAL | 0 refills | Status: DC

## 2017-05-09 MED ORDER — METHOCARBAMOL 500 MG PO TABS
1000.0000 mg | ORAL_TABLET | Freq: Once | ORAL | Status: AC
  Administered 2017-05-09: 1000 mg via ORAL
  Filled 2017-05-09: qty 2

## 2017-05-09 MED ORDER — METHOCARBAMOL 500 MG PO TABS: 500.0000 mg | ORAL_TABLET | Freq: Four times a day (QID) | ORAL | 0 refills | Status: DC

## 2017-05-09 MED ORDER — KETOROLAC TROMETHAMINE 30 MG/ML IJ SOLN
30.0000 mg | Freq: Once | INTRAMUSCULAR | Status: AC
  Administered 2017-05-09: 30 mg via INTRAMUSCULAR
  Filled 2017-05-09: qty 1

## 2017-05-09 MED ORDER — PREDNISONE 50 MG PO TABS: 50.0000 mg | ORAL_TABLET | Freq: Every day | ORAL | 0 refills | Status: DC

## 2017-05-09 NOTE — ED Triage Notes (Signed)
Pt has neck and upper back pain .  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  Pt took flexeril and motrin today without relief.  Pt alert.

## 2017-05-09 NOTE — ED Provider Notes (Signed)
[facility] Emergency Department Provider Note  ____________________________________________  Time seen: Approximately 11:45 PM  I have reviewed the triage vital signs and the nursing notes.   HISTORY  Chief Complaint Neck Pain    HPI Veronica Harmon is a 27 y.o. female who presents emergency department complaining of left-sided neck pain with radicular symptoms on the left arm.  Patient reports that she has ongoing neck and back pain with intermittent spasms and radicular symptoms.  Patient denies any recent trauma.  She denies any fevers or chills.  She reports that she woke up with left-sided neck stiffness and throughout the day she has experienced numbness and tingling down to her elbow.  No progression down to her hand.  She is taken 1 dose of Flexeril and Motrin without significant improvement.  Patient denies any headache, visual changes, chest pain, shortness of breath, abdominal pain, nausea vomiting.  Past Medical History:  Diagnosis Date  . Anxiety   . Chlamydia infection   . Depression     Patient Active Problem List   Diagnosis Date Noted  . Anxiety 12/10/2015    No past surgical history on file.  Prior to Admission medications   Medication Sig Start Date End Date Taking? Authorizing Provider  acyclovir (ZOVIRAX) 400 MG tablet Take 400 mg by mouth 2 (two) times daily.    [provider]  ALPRAZolam Prudy Feeler) 1 MG tablet Take 1 tablet (1 mg total) by mouth 3 (three) times daily as needed. 12/05/16   Shambley, Melody N, CNM  etonogestrel (NEXPLANON) 68 MG IMPL implant 1 each by Subdermal route once.    [provider]  FLUoxetine (PROZAC) 10 MG capsule TAKE ONE CAPSULE BY MOUTH EVERY DAY 03/07/17   Shambley, Melody N, CNM  FLUoxetine (PROZAC) 20 MG capsule Take 1 capsule (20 mg total) by mouth daily. 09/14/16   Shambley, Melody N, CNM  ISOtretinoin (ACCUTANE) 20 MG capsule Take 20 mg by mouth 2 (two) times daily.    [provider]  meloxicam (MOBIC) 15 MG tablet Take 1 tablet (15 mg total) by mouth daily. 05/09/17   Cuthriell, Delorise Royals, PA-C  methocarbamol (ROBAXIN) 500 MG tablet Take 1 tablet (500 mg total) by mouth 4 (four) times daily. 05/09/17   Cuthriell, Delorise Royals, PA-C  metroNIDAZOLE (METROGEL) 0.75 % vaginal gel Place 1 Applicatorful vaginally at bedtime. Apply one applicatorful to vagina at bedtime for 5 days Patient not taking: Reported on 02/03/2016 04/24/15   Shambley, Melody N, CNM  ondansetron (ZOFRAN ODT) 4 MG disintegrating tablet Take 1 tablet (4 mg total) by mouth every 8 (eight) hours as needed for nausea or vomiting. 03/23/17   Merrily Brittle, MD  predniSONE (DELTASONE) 50 MG tablet Take 1 tablet (50 mg total) by mouth daily with breakfast. 05/09/17   Cuthriell, Delorise Royals, PA-C    Allergies Allyl isothiocyanate and Sulfa antibiotics  Family History  Problem Relation Age of Onset  . Cancer Paternal Aunt        breast    Social History Social History   Tobacco Use  . Smoking status: Current Every Day Smoker    Packs/day: 0.50    Types: Cigarettes  . Smokeless tobacco: Never Used  Substance Use Topics  . Alcohol use: Yes    Comment: occas  . Drug use: No     Review of Systems  Constitutional: No fever/chills Eyes: No visual changes.  Cardiovascular: no chest pain. Respiratory: no cough. No SOB. Gastrointestinal: No abdominal pain.  No nausea, no vomiting.   Musculoskeletal: Positive for left-sided neck pain, stiffness, with radicular symptoms down the left arm. Skin: Negative for rash, abrasions, lacerations, ecchymosis. Neurological: Negative for headaches, focal weakness or numbness. 10-point ROS otherwise negative.  ____________________________________________   PHYSICAL EXAM:  VITAL SIGNS: ED Triage Vitals  Enc Vitals Group     BP 05/09/17 2006 125/75     Pulse Rate 05/09/17 2006 100     Resp 05/09/17 2006 16     Temp 05/09/17 2006 98.9 F (37.2 C)      Temp Source 05/09/17 2006 Oral     SpO2 05/09/17 2006 97 %     Weight 05/09/17 2007 160 lb (72.6 kg)     Height 05/09/17 2007 5\' 6"  (1.676 m)     Head Circumference --      Peak Flow --      Pain Score 05/09/17 2014 6     Pain Loc --      Pain Edu? --      Excl. in GC? --      Constitutional: Alert and oriented. Well appearing and in no acute distress. Eyes: Conjunctivae are normal. PERRL. EOMI. Head: Atraumatic. Neck: No stridor.  Midline cervical spine tenderness to palpation.  Tenderness to palpation over the left paraspinal muscle group with appreciable spasms.  Some tenderness to palpation of the trapezius muscle as well.  No palpable abnormality.  Full range of motion to the left shoulder and neck.  Radial pulse intact bilateral upper extremities.  Sensation intact and equal in all dermatomal distributions bilateral upper extremities.  No loss of grip strength.  Cardiovascular: Normal rate, regular rhythm. Normal S1 and S2.  Good peripheral circulation. Respiratory: Normal respiratory effort without tachypnea or retractions. Lungs CTAB. Good air entry to the bases with no decreased or absent breath sounds. Musculoskeletal: Full range of motion to all extremities. No gross deformities appreciated. Neurologic:  Normal speech and language. No gross focal neurologic deficits are appreciated.  Skin:  Skin is warm, dry and intact. No rash noted. Psychiatric: Mood and affect are normal. Speech and behavior are normal. Patient exhibits appropriate insight and judgement.   ____________________________________________   LABS (all labs ordered are listed, but only abnormal results are displayed)  Labs Reviewed - No data to display ____________________________________________  EKG   ____________________________________________  RADIOLOGY   No results found.  ____________________________________________    PROCEDURES  Procedure(s) performed:     Procedures    Medications  ketorolac (TORADOL) 30 MG/ML injection 30 mg (30 mg Intramuscular Given 05/09/17 2153)  methocarbamol (ROBAXIN) tablet 1,000 mg (1,000 mg Oral Given 05/09/17 2153)     ____________________________________________   INITIAL IMPRESSION / ASSESSMENT AND PLAN / ED COURSE  Pertinent labs & imaging results that were available during my care of the patient were reviewed by me and considered in my medical decision making (see chart for details).  Review of the [place] CSRS was performed in accordance of the [facility] prior to dispensing any controlled drugs.     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  Patient reported left-sided neck pain and stiffness with radicular symptoms today.  Symptoms are consistent with torticollis and radiculopathy.  Patient has intermittent issues with these conditions and states that symptoms were similar.  At this time, there was no indication for labs or imaging.  Patient given injection of Toradol and a muscle relaxer in the emergency department.. Patient will be discharged home with prescriptions for short prednisone  course to be followed with meloxicam.  She is also prescribed stronger muscle relaxer.. Patient is to follow up with neurosurgery as needed or otherwise directed. Patient is given ED precautions to return to the ED for any worsening or new symptoms.     ____________________________________________  FINAL CLINICAL IMPRESSION(S) / ED DIAGNOSES  Final diagnoses:  Torticollis  Cervical radiculopathy      NEW MEDICATIONS STARTED DURING THIS VISIT:  ED Discharge Orders        Ordered    predniSONE (DELTASONE) 50 MG tablet  Daily with breakfast     05/09/17 2146    methocarbamol (ROBAXIN) 500 MG tablet  4 times daily     05/09/17 2146    meloxicam (MOBIC) 15 MG tablet  Daily     05/09/17 2146          This chart was dictated using voice recognition software/Dragon. Despite  best efforts to proofread, errors can occur which can change the meaning. Any change was purely unintentional.    Racheal Patches, PA-C 05/09/17 2348    Arnaldo Natal, MD 05/09/17 2350

## 2017-05-10 ENCOUNTER — Other Ambulatory Visit: Payer: Self-pay | Admitting: *Deleted

## 2017-05-10 MED ORDER — ALPRAZOLAM 1 MG PO TABS: 1.0000 mg | ORAL_TABLET | Freq: Three times a day (TID) | ORAL | 3 refills | Status: DC | PRN

## 2017-05-16 ENCOUNTER — Other Ambulatory Visit: Payer: Self-pay | Admitting: Student

## 2017-05-16 DIAGNOSIS — M5412 Radiculopathy, cervical region: Secondary | ICD-10-CM

## 2017-05-22 ENCOUNTER — Ambulatory Visit: Payer: Medicaid Other

## 2017-05-31 ENCOUNTER — Encounter: Payer: Self-pay | Admitting: Radiology

## 2017-05-31 ENCOUNTER — Ambulatory Visit
Admission: RE | Admit: 2017-05-31 | Discharge: 2017-05-31 | Disposition: A | Discharge status: Home / Self Care | Payer: Medicaid Other | Source: Ambulatory Visit | Attending: Student | Admitting: Student

## 2017-05-31 DIAGNOSIS — R292 Abnormal reflex: Secondary | ICD-10-CM | POA: Diagnosis present

## 2017-05-31 DIAGNOSIS — M50222 Other cervical disc displacement at C5-C6 level: Secondary | ICD-10-CM | POA: Diagnosis not present

## 2017-05-31 DIAGNOSIS — M5412 Radiculopathy, cervical region: Secondary | ICD-10-CM | POA: Diagnosis not present

## 2017-05-31 DIAGNOSIS — R200 Anesthesia of skin: Secondary | ICD-10-CM | POA: Insufficient documentation

## 2017-08-14 ENCOUNTER — Telehealth: Payer: Self-pay | Admitting: Obstetrics and Gynecology

## 2017-08-14 ENCOUNTER — Other Ambulatory Visit: Payer: Self-pay | Admitting: *Deleted

## 2017-08-14 MED ORDER — FLUOXETINE HCL 10 MG PO CAPS: 10.0000 mg | ORAL_CAPSULE | Freq: Every day | ORAL | 4 refills | Status: DC

## 2017-08-14 MED ORDER — FLUOXETINE HCL 20 MG PO CAPS: 20.0000 mg | ORAL_CAPSULE | Freq: Every day | ORAL | 3 refills | Status: DC

## 2017-08-14 NOTE — Telephone Encounter (Signed)
The patient called and stated that she would like to speak with Amy in in regards to her needing a refill on her medication FLUoxetine (PROZAC), No other information was disclosed. Please advise.

## 2017-08-14 NOTE — Telephone Encounter (Signed)
Done-ac 

## 2017-09-19 ENCOUNTER — Other Ambulatory Visit: Payer: Self-pay | Admitting: Obstetrics and Gynecology

## 2017-10-26 ENCOUNTER — Encounter: Payer: Self-pay | Admitting: Emergency Medicine

## 2017-10-26 ENCOUNTER — Other Ambulatory Visit: Payer: Self-pay

## 2017-10-26 ENCOUNTER — Emergency Department: Payer: Medicaid Other

## 2017-10-26 ENCOUNTER — Emergency Department
Admission: EM | Admit: 2017-10-26 | Discharge: 2017-10-26 | Disposition: A | Discharge status: Home / Self Care | Payer: Medicaid Other | Attending: Emergency Medicine | Admitting: Emergency Medicine

## 2017-10-26 DIAGNOSIS — F17210 Nicotine dependence, cigarettes, uncomplicated: Secondary | ICD-10-CM | POA: Diagnosis not present

## 2017-10-26 DIAGNOSIS — J209 Acute bronchitis, unspecified: Secondary | ICD-10-CM | POA: Insufficient documentation

## 2017-10-26 DIAGNOSIS — Z79899 Other long term (current) drug therapy: Secondary | ICD-10-CM | POA: Diagnosis not present

## 2017-10-26 DIAGNOSIS — R0602 Shortness of breath: Secondary | ICD-10-CM | POA: Diagnosis present

## 2017-10-26 LAB — BASIC METABOLIC PANEL
ANION GAP: 7 (ref 5–15)
BUN: 10 mg/dL (ref 6–20)
CHLORIDE: 104 mmol/L (ref 98–111)
CO2: 25 mmol/L (ref 22–32)
CREATININE: 0.85 mg/dL (ref 0.44–1.00)
Calcium: 8.8 mg/dL — ABNORMAL LOW (ref 8.9–10.3)
GFR calc Af Amer: >60 mL/min (ref >60)
GFR calc non Af Amer: >60 mL/min (ref >60)
Glucose, Bld: 83 mg/dL (ref 70–99)
POTASSIUM: 3.6 mmol/L (ref 3.5–5.1)
Sodium: 136 mmol/L (ref 135–145)

## 2017-10-26 LAB — CBC
HEMATOCRIT: 42.1 % (ref 35.0–47.0)
HEMOGLOBIN: 14.4 g/dL (ref 12.0–16.0)
MCH: 31.3 pg (ref 26.0–34.0)
MCHC: 34.3 g/dL (ref 32.0–36.0)
MCV: 91.5 fL (ref 80.0–100.0)
PLATELETS: 265 10*3/uL (ref 150–440)
RBC: 4.6 MIL/uL (ref 3.80–5.20)
RDW: 12.7 % (ref 11.5–14.5)
WBC: 13.2 10*3/uL — AB (ref 3.6–11.0)

## 2017-10-26 LAB — POCT PREGNANCY, URINE: Preg Test, Ur: NEGATIVE

## 2017-10-26 LAB — TROPONIN I

## 2017-10-26 MED ORDER — SODIUM CHLORIDE 0.9 % IV BOLUS
500.0000 mL | Freq: Once | INTRAVENOUS | Status: AC
  Administered 2017-10-26: 500 mL via INTRAVENOUS

## 2017-10-26 MED ORDER — ALBUTEROL SULFATE (2.5 MG/3ML) 0.083% IN NEBU
2.5000 mg | INHALATION_SOLUTION | Freq: Once | RESPIRATORY_TRACT | Status: AC
  Administered 2017-10-26: 2.5 mg via RESPIRATORY_TRACT
  Filled 2017-10-26: qty 3

## 2017-10-26 MED ORDER — ALBUTEROL SULFATE HFA 108 (90 BASE) MCG/ACT IN AERS: 2.0000 | INHALATION_SPRAY | RESPIRATORY_TRACT | 0 refills | Status: DC | PRN

## 2017-10-26 NOTE — ED Triage Notes (Addendum)
Pt presents to ED via POV with c/o chest pain and SHOB. Pt states pain in central chest that radiates to her back. Pt states pain since last night. Pt also c/o cough, states pain in chest is worse with deep inspiration and when she lays down.

## 2017-10-26 NOTE — Discharge Instructions (Signed)
Use the albuterol inhaler every 4-6 hours for the next several days.  Take tylenol or ibuprofen as needed for pain.  Your regular doctor.  Return to the ER for new, worsening, persistent severe chest pain, difficulty breathing, weakness, fevers, or any other new or worsening symptoms that concern you.

## 2017-10-26 NOTE — ED Provider Notes (Signed)
[facility] Emergency Department Provider Note ____________________________________________   First MD Initiated Contact with Patient 10/26/17 1420     (approximate)  I have reviewed the triage vital signs and the nursing notes.   HISTORY  Chief Complaint Chest Pain and Shortness of Breath    HPI Veronica Harmon is a 27 y.o. female with PMH as noted below who presents with shortness of breath since last night, worse with exertion, associated with nonproductive cough, as well as with some chest pain which she describes as tightness and occurring mainly with the cough.  She states it is worse when she takes deep breath, and is radiating to her back.  Patient reports nasal congestion and rhinorrhea over the last several days.  Past Medical History:  Diagnosis Date  . Anxiety   . Chlamydia infection   . Depression     Patient Active Problem List   Diagnosis Date Noted  . Anxiety 12/10/2015    History reviewed. No pertinent surgical history.  Prior to Admission medications   Medication Sig Start Date End Date Taking? Authorizing Provider  ALPRAZolam Prudy Feeler) 1 MG tablet Take 1 tablet (1 mg total) by mouth 3 (three) times daily as needed. 05/10/17  Yes Shambley, Melody N, CNM  ARIPiprazole (ABILIFY) 2 MG tablet Take 1 mg by mouth daily. 08/23/17  Yes [provider]  etonogestrel (NEXPLANON) 68 MG IMPL implant 1 each by Subdermal route once.   Yes [provider]  FLUoxetine (PROZAC) 10 MG capsule Take 1 capsule (10 mg total) by mouth daily. 08/14/17  Yes Shambley, Melody N, CNM  FLUoxetine (PROZAC) 20 MG capsule Take 1 capsule (20 mg total) by mouth daily. 08/14/17  Yes Shambley, Melody N, CNM  meloxicam (MOBIC) 15 MG tablet Take 1 tablet (15 mg total) by mouth daily. 05/09/17  Yes Cuthriell, Delorise Royals, PA-C  methocarbamol (ROBAXIN) 500 MG tablet Take 1 tablet (500 mg total) by mouth 4 (four) times daily. 05/09/17  Yes Cuthriell, Delorise Royals, PA-C   ondansetron (ZOFRAN ODT) 4 MG disintegrating tablet Take 1 tablet (4 mg total) by mouth every 8 (eight) hours as needed for nausea or vomiting. 03/23/17  Yes Merrily Brittle, MD  albuterol (PROVENTIL HFA;VENTOLIN HFA) 108 (90 Base) MCG/ACT inhaler Inhale 2 puffs into the lungs every 4 (four) hours as needed for wheezing or shortness of breath. 10/26/17   Dionne Bucy, MD  predniSONE (DELTASONE) 50 MG tablet Take 1 tablet (50 mg total) by mouth daily with breakfast. Patient not taking: Reported on 10/26/2017 05/09/17   Cuthriell, Delorise Royals, PA-C    Allergies Allyl isothiocyanate and Sulfa antibiotics  Family History  Problem Relation Age of Onset  . Cancer Paternal Aunt        breast    Social History Social History   Tobacco Use  . Smoking status: Current Every Day Smoker    Packs/day: 0.50    Types: Cigarettes  . Smokeless tobacco: Never Used  Substance Use Topics  . Alcohol use: Yes    Comment: occas  . Drug use: No    Review of Systems  Constitutional: No fever. Eyes: No redness. ENT: Positive for nasal congestion Cardiovascular: Positive for chest pain. Respiratory: Positive for shortness of breath. Gastrointestinal: No vomiting or diarrhea.  Genitourinary: Negative for dysuria.  Musculoskeletal: Positive for back pain. Skin: Negative for rash. Neurological: Negative for headache.   ____________________________________________   PHYSICAL EXAM:  VITAL SIGNS: ED Triage Vitals  Enc Vitals Group  BP 10/26/17 1313 115/71     Pulse Rate 10/26/17 1313 (!) 107     Resp 10/26/17 1313 20     Temp 10/26/17 1313 97.9 F (36.6 C)     Temp Source 10/26/17 1313 Oral     SpO2 10/26/17 1313 97 %     Weight 10/26/17 1314 175 lb (79.4 kg)     Height 10/26/17 1314 5\' 6"  (1.676 m)     Head Circumference --      Peak Flow --      Pain Score 10/26/17 1314 6     Pain Loc --      Pain Edu? --      Excl. in GC? --     Constitutional: Alert and oriented. Well  appearing and in no acute distress. Eyes: Conjunctivae are normal.  Head: Atraumatic. Nose: Nasal congestion. Mouth/Throat: Mucous membranes are moist.   Neck: Normal range of motion.  Cardiovascular: Normal rate, regular rhythm. Grossly normal heart sounds.  Good peripheral circulation. Respiratory: Normal respiratory effort.  No retractions.  Somewhat decreased breath sounds bilaterally and prolonged expiratory phase. Gastrointestinal: Soft and nontender. No distention.  Genitourinary: No flank tenderness. Musculoskeletal: No lower extremity edema.  Extremities warm and well perfused.  No calf or popliteal swelling or tenderness. Neurologic:  Normal speech and language. No gross focal neurologic deficits are appreciated.  Skin:  Skin is warm and dry. No rash noted. Psychiatric: Mood and affect are normal. Speech and behavior are normal.  ____________________________________________   LABS (all labs ordered are listed, but only abnormal results are displayed)  Labs Reviewed  BASIC METABOLIC PANEL - Abnormal; Notable for the following components:      Result Value   Calcium 8.8 (*)    All other components within normal limits  CBC - Abnormal; Notable for the following components:   WBC 13.2 (*)    All other components within normal limits  TROPONIN I  POC URINE PREG, ED  POCT PREGNANCY, URINE   ____________________________________________  EKG  ED ECG REPORT I, Dionne BucySebastian Yacqub Baston, the attending physician, personally viewed and interpreted this ECG.  Date: 10/26/2017 EKG Time: 1309 Rate: 113 Rhythm: Sinus tachycardia QRS Axis: normal Intervals: normal ST/T Wave abnormalities: normal Narrative Interpretation: no evidence of acute ischemia  ____________________________________________  RADIOLOGY  CXR: No focal infiltrate or other acute findings   ____________________________________________   PROCEDURES  Procedure(s) performed: No  Procedures  Critical Care  performed: \No ____________________________________________   INITIAL IMPRESSION / ASSESSMENT AND PLAN / ED COURSE  Pertinent labs & imaging results that were available during my care of the patient were reviewed by me and considered in my medical decision making (see chart for details).  27 year old female with PMH as noted above presents with atypical chest pain over the last day, associated with shortness of breath, cough, and upper respiratory symptoms.  On exam, the patient is well-appearing.  She was slightly tachycardic at triage, but her vital signs are now normal.  Lungs are clear with slightly prolonged expiratory phase, and the remainder of the exam is as described above.  Initial lab work-up from triage is unremarkable, and chest x-ray shows no focal infiltrate.  Given that pneumonia has been ruled out, most likely diagnosis is acute bronchitis, likely viral.  Although the patient's pain is somewhat pleuritic, given the significant upper respiratory symptoms, and active cough, as well as lack of hypoxia, there is no evidence of PE.  Although [facility], the  patient has no clinical evidence of DVT and no specific risk factors for VTE.  The patient has no risk factors for ACS.  Plan: Trial of albuterol nebulizer.  I offered steroid as well, but the patient states that she does not like how the steroids make her feel and declined.  We will reassess after nebs and anticipate discharge home.  ----------------------------------------- 3:44 PM on 10/26/2017 -----------------------------------------  The patient is feeling better after albuterol and would like to go home.  I discussed the results of the work-up and the plan of care with her.  Return precautions given, and she expresses understanding. ____________________________________________   FINAL CLINICAL IMPRESSION(S) / ED DIAGNOSES  Final diagnoses:  Acute bronchitis, unspecified organism      NEW  MEDICATIONS STARTED DURING THIS VISIT:  New Prescriptions   ALBUTEROL (PROVENTIL HFA;VENTOLIN HFA) 108 (90 BASE) MCG/ACT INHALER    Inhale 2 puffs into the lungs every 4 (four) hours as needed for wheezing or shortness of breath.     Note:  This document was prepared using Dragon voice recognition software and may include unintentional dictation errors.     Dionne Bucy, MD 10/26/17 [phone]

## 2017-10-26 NOTE — ED Notes (Signed)
Pt verbalizes understanding of d/c instructions, medications and follow up 

## 2017-10-31 ENCOUNTER — Other Ambulatory Visit: Payer: Self-pay | Admitting: *Deleted

## 2017-10-31 MED ORDER — METRONIDAZOLE 0.75 % VA GEL: 1.0000 | Freq: Two times a day (BID) | VAGINAL | 0 refills | Status: DC

## 2018-01-30 ENCOUNTER — Telehealth: Payer: Self-pay | Admitting: Obstetrics and Gynecology

## 2018-01-30 ENCOUNTER — Encounter: Payer: Self-pay | Admitting: *Deleted

## 2018-01-30 ENCOUNTER — Other Ambulatory Visit: Payer: Self-pay | Admitting: *Deleted

## 2018-01-30 MED ORDER — ALPRAZOLAM 1 MG PO TABS: 1.0000 mg | ORAL_TABLET | Freq: Three times a day (TID) | ORAL | 0 refills | Status: DC | PRN

## 2018-01-30 MED ORDER — ALPRAZOLAM 1 MG PO TABS: 1.0000 mg | ORAL_TABLET | Freq: Three times a day (TID) | ORAL | 2 refills | Status: DC | PRN

## 2018-01-30 NOTE — Telephone Encounter (Signed)
Called pt she states she is going to still take the medication, she is getting married in November and she needs to lose weight

## 2018-01-30 NOTE — Telephone Encounter (Signed)
The patient called and stated that she needs a refill of her prescription Xanax sent to her pharmacy [facility] in [place]. No other information was disclosed. Please advise.

## 2018-02-07 ENCOUNTER — Encounter: Payer: Self-pay | Admitting: *Deleted

## 2018-02-07 ENCOUNTER — Other Ambulatory Visit: Payer: Self-pay | Admitting: *Deleted

## 2018-02-16 ENCOUNTER — Encounter: Payer: Self-pay | Admitting: Emergency Medicine

## 2018-02-16 ENCOUNTER — Other Ambulatory Visit: Payer: Self-pay

## 2018-02-16 ENCOUNTER — Emergency Department: Payer: Medicaid Other

## 2018-02-16 ENCOUNTER — Emergency Department
Admission: EM | Admit: 2018-02-16 | Discharge: 2018-02-16 | Disposition: A | Discharge status: Home / Self Care | Payer: Medicaid Other | Attending: Student in an Organized Health Care Education/Training Program | Admitting: Student in an Organized Health Care Education/Training Program

## 2018-02-16 DIAGNOSIS — F17210 Nicotine dependence, cigarettes, uncomplicated: Secondary | ICD-10-CM | POA: Insufficient documentation

## 2018-02-16 DIAGNOSIS — M5410 Radiculopathy, site unspecified: Secondary | ICD-10-CM | POA: Diagnosis not present

## 2018-02-16 DIAGNOSIS — Z79899 Other long term (current) drug therapy: Secondary | ICD-10-CM | POA: Insufficient documentation

## 2018-02-16 DIAGNOSIS — M5010 Cervical disc disorder with radiculopathy, unspecified cervical region: Secondary | ICD-10-CM

## 2018-02-16 DIAGNOSIS — M542 Cervicalgia: Secondary | ICD-10-CM | POA: Diagnosis not present

## 2018-02-16 MED ORDER — MORPHINE SULFATE (PF) 4 MG/ML IV SOLN: 4.0000 mg | Freq: Once | INTRAVENOUS | Status: DC

## 2018-02-16 MED ORDER — PREDNISONE 10 MG (21) PO TBPK: ORAL_TABLET | ORAL | 0 refills | Status: DC

## 2018-02-16 MED ORDER — ORPHENADRINE CITRATE 30 MG/ML IJ SOLN
60.0000 mg | Freq: Two times a day (BID) | INTRAMUSCULAR | Status: DC
  Administered 2018-02-16: 60 mg via INTRAVENOUS
  Filled 2018-02-16: qty 2

## 2018-02-16 MED ORDER — MORPHINE SULFATE (PF) 4 MG/ML IV SOLN
4.0000 mg | Freq: Once | INTRAVENOUS | Status: AC | PRN
  Administered 2018-02-16: 4 mg via INTRAVENOUS
  Filled 2018-02-16: qty 1

## 2018-02-16 MED ORDER — SODIUM CHLORIDE 0.9 % IV BOLUS
1000.0000 mL | Freq: Once | INTRAVENOUS | Status: AC
  Administered 2018-02-16: 1000 mL via INTRAVENOUS

## 2018-02-16 MED ORDER — OXYCODONE HCL 5 MG PO TABS: 5.0000 mg | ORAL_TABLET | Freq: Three times a day (TID) | ORAL | 0 refills | Status: DC | PRN

## 2018-02-16 MED ORDER — MORPHINE SULFATE (PF) 4 MG/ML IV SOLN
4.0000 mg | Freq: Once | INTRAVENOUS | Status: AC
  Administered 2018-02-16: 4 mg via INTRAVENOUS
  Filled 2018-02-16: qty 1

## 2018-02-16 MED ORDER — ONDANSETRON HCL 4 MG/2ML IJ SOLN
4.0000 mg | Freq: Once | INTRAMUSCULAR | Status: AC
  Administered 2018-02-16: 4 mg via INTRAVENOUS
  Filled 2018-02-16: qty 2

## 2018-02-16 NOTE — Discharge Instructions (Signed)
Please call Dr. Myer Haff first thing Monday morning to schedule an appointment. Return to the ER over the weekend or at any time if you develop weakness or other symptoms of concern.

## 2018-02-16 NOTE — ED Provider Notes (Signed)
[facility] Emergency Department Provider Note  ____________________________________________   First MD Initiated Contact with Patient 02/16/18 1340     (approximate)  I have reviewed the triage vital signs and the nursing notes.   HISTORY  Chief Complaint Neck Pain    HPI Ameera Tigue is a 27 y.o. female presents emergency department complaining of severe neck pain that radiates down to the left arm and hand.  She states she has had a history of neck problems but today is the worst she is ever had.  Her mother states she has been crying nonstop due to the pain.  She has had difficulty with movement of her head at all.  She denies any fever or chills.  She denies chest pain or shortness of breath.    Past Medical History:  Diagnosis Date  . Anxiety   . Chlamydia infection   . Depression     Patient Active Problem List   Diagnosis Date Noted  . Anxiety 12/10/2015    History reviewed. No pertinent surgical history.  Prior to Admission medications   Medication Sig Start Date End Date Taking? Authorizing Provider  albuterol (PROVENTIL HFA;VENTOLIN HFA) 108 (90 Base) MCG/ACT inhaler Inhale 2 puffs into the lungs every 4 (four) hours as needed for wheezing or shortness of breath. 10/26/17   Dionne Bucy, MD  ALPRAZolam Prudy Feeler) 1 MG tablet Take 1 tablet (1 mg total) by mouth 3 (three) times daily as needed. 01/30/18   Shambley, Melody N, CNM  ARIPiprazole (ABILIFY) 2 MG tablet Take 1 mg by mouth daily. 08/23/17   [provider]  etonogestrel (NEXPLANON) 68 MG IMPL implant 1 each by Subdermal route once.    [provider]  FLUoxetine (PROZAC) 10 MG capsule Take 1 capsule (10 mg total) by mouth daily. 08/14/17   Shambley, Melody N, CNM  FLUoxetine (PROZAC) 20 MG capsule Take 1 capsule (20 mg total) by mouth daily. 08/14/17   Shambley, Melody N, CNM  meloxicam (MOBIC) 15 MG tablet Take 1 tablet (15 mg total) by mouth daily. 05/09/17    Cuthriell, Delorise Royals, PA-C  methocarbamol (ROBAXIN) 500 MG tablet Take 1 tablet (500 mg total) by mouth 4 (four) times daily. 05/09/17   Cuthriell, Delorise Royals, PA-C  metroNIDAZOLE (METROGEL VAGINAL) 0.75 % vaginal gel Place 1 Applicatorful vaginally 2 (two) times daily. 10/31/17   Shambley, Melody N, CNM  ondansetron (ZOFRAN ODT) 4 MG disintegrating tablet Take 1 tablet (4 mg total) by mouth every 8 (eight) hours as needed for nausea or vomiting. 03/23/17   Merrily Brittle, MD  predniSONE (DELTASONE) 50 MG tablet Take 1 tablet (50 mg total) by mouth daily with breakfast. Patient not taking: Reported on 10/26/2017 05/09/17   Cuthriell, Delorise Royals, PA-C    Allergies Allyl isothiocyanate and Sulfa antibiotics  Family History  Problem Relation Age of Onset  . Cancer Paternal Aunt        breast    Social History Social History   Tobacco Use  . Smoking status: Current Every Day Smoker    Packs/day: 0.50    Types: Cigarettes  . Smokeless tobacco: Never Used  Substance Use Topics  . Alcohol use: Yes    Comment: occas  . Drug use: No    Review of Systems  Constitutional: No fever/chills Eyes: No visual changes. ENT: No sore throat. Respiratory: Denies cough Genitourinary: Negative for dysuria. Musculoskeletal: Negative for back pain.  Positive for neck pain with radiation to the left arm Skin:  Negative for rash.    ____________________________________________   PHYSICAL EXAM:  VITAL SIGNS: ED Triage Vitals  Enc Vitals Group     BP 02/16/18 1142 118/80     Pulse Rate 02/16/18 1142 76     Resp 02/16/18 1142 16     Temp 02/16/18 1142 98.3 F (36.8 C)     Temp Source 02/16/18 1142 Oral     SpO2 02/16/18 1142 99 %     Weight 02/16/18 1139 175 lb 0.7 oz (79.4 kg)     Height 02/16/18 1139 5\' 6"  (1.676 m)     Head Circumference --      Peak Flow --      Pain Score 02/16/18 1138 8     Pain Loc --      Pain Edu? --      Excl. in GC? --     Constitutional: Alert and  oriented. Well appearing and in mild distress due to pain.   Eyes: Conjunctivae are normal.  Head: Atraumatic. Nose: No congestion/rhinnorhea. Mouth/Throat: Mucous membranes are moist.   Neck:  supple no lymphadenopathy noted, cervical tenderness noted, decreased range of motion of the C-spine. Cardiovascular: Normal rate, regular rhythm. Heart sounds are normal Respiratory: Normal respiratory effort.  No retractions, lungs c t a  GU: deferred Musculoskeletal: FROM all extremities, warm and well perfused.  Limited range of motion of the C-spine secondary to discomfort.  C-spine is mildly tender.  Decreased in the left hand when compared.  To the right. Neurologic:  Normal speech and language.  Skin:  Skin is warm, dry and intact. No rash noted. Psychiatric: Mood and affect are normal. Speech and behavior are normal.  ____________________________________________   LABS (all labs ordered are listed, but only abnormal results are displayed)  Labs Reviewed - No data to display ____________________________________________   ____________________________________________  RADIOLOGY  MRI of C-spine  ____________________________________________   PROCEDURES  Procedure(s) performed: Saline lock, morphine 4 mg IV, Norflex 60 mg IV  Procedures    ____________________________________________   INITIAL IMPRESSION / ASSESSMENT AND PLAN / ED COURSE  Pertinent labs & imaging results that were available during my care of the patient were reviewed by me and considered in my medical decision making (see chart for details).   Patient is a 27 year old female presents emergency department complaining of severe neck pain with radiation to the left arm.  On physical exam patient appears to be in quite a bit of discomfort.  She is crying upon palpation of the neck.  Grips in the left hand are decreased when compared to the right.  Patient was given morphine 4 mg IV and Norflex 60 mg IV,  Zofran 4 mg IV.  Patient states she is more comfortable upon recheck.  MRI of the C-spine was ordered   Insert time stamp.  Patient has had some relief with the morphine and Norflex.  She is aware of the MRI pending.  At this time I am releasing care to [facility], NP.  As part of my medical decision making, I reviewed the following data within the electronic MEDICAL RECORD NUMBER History obtained from family, Nursing notes reviewed and incorporated, Old chart reviewed, Notes from prior ED visits and [place] Controlled Substance Database  ____________________________________________   FINAL CLINICAL IMPRESSION(S) / ED DIAGNOSES  Final diagnoses:  Neck pain, acute  Radiculopathy, unspecified spinal region      NEW MEDICATIONS STARTED DURING THIS VISIT:  New Prescriptions   No medications on file  Note:  This document was prepared using Dragon voice recognition software and may include unintentional dictation errors.    Faythe Ghee, PA-C 02/16/18 1618    Willy Eddy, MD 02/16/18 [phone]

## 2018-02-16 NOTE — ED Notes (Signed)
Called [facility] for consult  [phone]

## 2018-02-16 NOTE — ED Provider Notes (Signed)
-----------------------------------------   4:29 PM on 02/16/2018 -----------------------------------------   Blood pressure 118/80, pulse 76, temperature 98.3 F (36.8 C), temperature source Oral, resp. rate 16, height 5\' 6"  (1.676 m), weight 79.4 kg, SpO2 99 %.  Assuming care from S. Fisher, PA-C.  In short, Veronica Harmon is a 27 y.o. female with a chief complaint of Neck Pain .  Refer to the original H&P for additional details.  The current plan of care is to await MRI, provide pain control, and set disposition.  ----------------------------------------- 6:57 PM on 02/16/2018 -----------------------------------------  Results of the MRI reviewed with the patient and her family.  Dr. Claudette Laws with [facility] was consulted.  Because she has no weakness or other neuro deficits at this time, we both agree that it is reasonable for her to be discharged home to follow-up with Dr. Marcell Barlow.  Patient is established in that office.  She was given strict ER return precautions and basically told that if she has any weakness or new symptoms of concern that she should return to the emergency department immediately.  Patient and family agree with this plan and states that they will call first thing Monday morning to schedule an appointment.        Veronica Pester, FNP 02/16/18 1859    Willy Eddy, MD 02/17/18 [phone]

## 2018-02-16 NOTE — ED Triage Notes (Signed)
Arrives with c/o moving neck wrong this morning. C/O neck pain.

## 2018-02-28 ENCOUNTER — Telehealth: Payer: Self-pay | Admitting: Obstetrics and Gynecology

## 2018-02-28 NOTE — Telephone Encounter (Signed)
Chip Boer would you please make her appointment to be seen. Thanks

## 2018-02-28 NOTE — Telephone Encounter (Signed)
The patient is asking for a refill on her Xanax.  She stated when she picked it up last month it had no refills on the bottle and she needs more. Please advise, thanks.

## 2018-03-02 ENCOUNTER — Other Ambulatory Visit (HOSPITAL_COMMUNITY)
Admission: RE | Admit: 2018-03-02 | Discharge: 2018-03-02 | Disposition: A | Discharge status: Home / Self Care | Payer: Medicaid Other | Source: Ambulatory Visit | Attending: Obstetrics and Gynecology | Admitting: Obstetrics and Gynecology

## 2018-03-02 ENCOUNTER — Ambulatory Visit (INDEPENDENT_AMBULATORY_CARE_PROVIDER_SITE_OTHER): Payer: Medicaid Other | Admitting: Obstetrics and Gynecology

## 2018-03-02 ENCOUNTER — Encounter: Payer: Self-pay | Admitting: Obstetrics and Gynecology

## 2018-03-02 VITALS — BP 113/78 | HR 98 | Ht 66.0 in | Wt 181.4 lb

## 2018-03-02 DIAGNOSIS — M542 Cervicalgia: Secondary | ICD-10-CM

## 2018-03-02 DIAGNOSIS — F419 Anxiety disorder, unspecified: Secondary | ICD-10-CM | POA: Diagnosis not present

## 2018-03-02 DIAGNOSIS — G542 Cervical root disorders, not elsewhere classified: Secondary | ICD-10-CM

## 2018-03-02 DIAGNOSIS — L700 Acne vulgaris: Secondary | ICD-10-CM

## 2018-03-02 DIAGNOSIS — G8929 Other chronic pain: Secondary | ICD-10-CM

## 2018-03-02 DIAGNOSIS — Z0000 Encounter for general adult medical examination without abnormal findings: Secondary | ICD-10-CM | POA: Diagnosis not present

## 2018-03-02 DIAGNOSIS — Z01419 Encounter for gynecological examination (general) (routine) without abnormal findings: Secondary | ICD-10-CM

## 2018-03-02 DIAGNOSIS — M549 Dorsalgia, unspecified: Secondary | ICD-10-CM

## 2018-03-02 MED ORDER — BUPROPION HCL ER (XL) 150 MG PO TB24: 150.0000 mg | ORAL_TABLET | Freq: Every day | ORAL | 6 refills | Status: DC

## 2018-03-02 MED ORDER — ALPRAZOLAM 1 MG PO TABS: 1.0000 mg | ORAL_TABLET | Freq: Three times a day (TID) | ORAL | 2 refills | Status: DC | PRN

## 2018-03-02 NOTE — Patient Instructions (Addendum)
Place annual gynecologic exam patient instructions here.  Laparoscopic Tubal Ligation Laparoscopic tubal ligation is a procedure to close the fallopian tubes. This is done so that you cannot get pregnant. When the fallopian tubes are closed, the eggs that your ovaries release cannot enter the uterus, and sperm cannot reach the released eggs. A laparoscopic tubal ligation is sometimes called "getting your tubes tied." You should not have this procedure if you want to get pregnant someday or if you are unsure about having more children. Tell a health care provider about:  Any allergies you have.  All medicines you are taking, including vitamins, herbs, eye drops, creams, and over-the-counter medicines.  Any problems you or family members have had with anesthetic medicines.  Any blood disorders you have.  Any surgeries you have had.  Any medical conditions you have.  Whether you are pregnant or may be pregnant.  Any past pregnancies. What are the risks? Generally, this is a safe procedure. However, problems may occur, including:  Infection.  Bleeding.  Injury to surrounding organs.  Side effects from anesthetics.  Failure of the procedure.  This procedure can increase your risk of a kind of pregnancy in which a fertilized egg attaches to the outside of the uterus (ectopic pregnancy). What happens before the procedure?  Ask your health care provider about: ? Changing or stopping your regular medicines. This is especially important if you are taking diabetes medicines or blood thinners. ? Taking medicines such as aspirin and ibuprofen. These medicines can thin your blood. Do not take these medicines before your procedure if your health care provider instructs you not to.  Follow instructions from your health care provider about eating and drinking restrictions.  Plan to have someone take you home after the procedure.  If you go home right after the procedure, plan to have  someone with you for 24 hours. What happens during the procedure?  You will be given one or more of the following: ? A medicine to help you relax (sedative). ? A medicine to numb the area (local anesthetic). ? A medicine to make you fall asleep (general anesthetic). ? A medicine that is injected into an area of your body to numb everything below the injection site (regional anesthetic).  An IV tube will be inserted into one of your veins. It will be used to give you medicines and fluids during the procedure.  Your bladder may be emptied with a small tube (catheter).  If you have been given a general anesthetic, a tube will be put down your throat to help you breathe.  Two small cuts (incisions) will be made in your lower abdomen and near your belly button.  Your abdomen will be inflated with a gas. This will let the surgeon see better and will give the surgeon room to work.  A thin, lighted tube (laparoscope) with a camera attached will be inserted into your abdomen through one of the incisions. Small instruments will be inserted through the other incision.  The fallopian tubes will be tied off, burned (cauterized), or blocked with a clip, ring, or clamp. A small portion in the center of each fallopian tube may be removed.  The gas will be released from the abdomen.  The incisions will be closed with stitches (sutures).  A bandage (dressing) will be placed over the incisions. The procedure may vary among health care providers and hospitals. What happens after the procedure?  Your blood pressure, heart rate, breathing rate, and blood oxygen level  will be monitored often until the medicines you were given have worn off.  You will be given medicine to help with pain, nausea, and vomiting as needed. This information is not intended to replace advice given to you by your health care provider. Make sure you discuss any questions you have with your health care provider. Document Released:  07/25/2000 Document Revised: 09/24/2015 Document Reviewed: 03/29/2015 Elsevier Interactive Patient Education  [employer].

## 2018-03-02 NOTE — Progress Notes (Signed)
Subjective:     Veronica Harmon is a 27 y.o. female and is here for a comprehensive physical exam. The patient reports problems - acne, anxiety returning since stopped prozac 4 months ago..is taking 1-4 xanax a week but hasn't had a panic attack since stopping as they resolved with the prozac use. Just felt like it wasn't helping much .tried zoloft as a teenager and had suicidal thoughts with it.   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 Then needs to see sinal-neurosurgeon to discuss surgery options.   Desires BTL, as she is starting accutane for the second time and will want removal on nexplanon as her acne has been worse since it was placed.   Social History   Socioeconomic History  . Marital status: Divorced    Spouse name: Not on file  . Number of children: Not on file  . Years of education: Not on file  . Highest education level: Not on file  Occupational History  . Not on file  Social Needs  . Financial resource strain: Not on file  . Food insecurity:    Worry: Not on file    Inability: Not on file  . Transportation needs:    Medical: Not on file    Non-medical: Not on file  Tobacco Use  . Smoking status: Current Every Day Smoker    Packs/day: 0.50    Types: Cigarettes  . Smokeless tobacco: Never Used  Substance and Sexual Activity  . Alcohol use: Yes    Comment: occas  . Drug use: No  . Sexual activity: Yes    Birth control/protection: Implant  Lifestyle  . Physical activity:    Days per week: Not on file    Minutes per session: Not on file  . Stress: Not on file  Relationships  . Social connections:    Talks on phone: Not on file    Gets together: Not on file    Attends religious service: Not on file    Active member of club or organization: Not on file    Attends meetings of clubs or organizations: Not on file    Relationship status: Not on file  . Intimate partner  violence:    Fear of current or ex partner: Not on file    Emotionally abused: Not on file    Physically abused: Not on file    Forced sexual activity: Not on file  Other Topics Concern  . Not on file  Social History Narrative  . Not on file   Health Maintenance  Topic Date Due  . HIV Screening  06/17/2003  . TETANUS/TDAP  06/17/2007  . PAP SMEAR  02/07/2017  . INFLUENZA VACCINE  03/03/2019 (Originally 11/30/2017)    The following portions of the patient's history were reviewed and updated as appropriate: allergies, current medications, past family history, past medical history, past social history, past surgical history and problem list.  Review of Systems Pertinent items noted in HPI and remainder of comprehensive ROS otherwise negative.   Objective:    General appearance: alert, cooperative and appears stated age Neck: no adenopathy, no carotid bruit, no JVD, supple, symmetrical, trachea midline and thyroid not enlarged, symmetric, no tenderness/mass/nodules Lungs: clear to auscultation bilaterally Breasts: normal appearance, no masses or tenderness Heart: regular rate and rhythm, S1, S2 normal, no murmur, click, rub or gallop Abdomen: soft, non-tender; bowel sounds normal; no masses,  no organomegaly Pelvic: cervix  normal in appearance, external genitalia normal, no adnexal masses or tenderness, no cervical motion tenderness, rectovaginal septum normal, uterus normal size, shape, and consistency and vagina normal without discharge    Assessment:    Healthy female exam.  Chronic neck pain Desires permanent sterilization Anxiety Acne      Plan:  Referred to Dr Lovell Sheehan in [place] to discuss surgery options. Started on wellbutrin 150mg  XL daily, xanax refilled. Medicaid tubal consent form signed and counseled on BTL. Will follow up with Dr Valentino Saxon in 2 weeks to schedule procedure. Advised to move forward with Accutane. RTC as needed otherwise.   Chandon Lazcano,CNM    See After Visit Summary for Counseling Recommendations

## 2018-03-03 LAB — THYROID PANEL WITH TSH
FREE THYROXINE INDEX: 2.8 (ref 1.2–4.9)
T3 UPTAKE RATIO: 32 % (ref 24–39)
T4, Total: 8.8 ug/dL (ref 4.5–12.0)
TSH: 1.07 u[IU]/mL (ref 0.450–4.500)

## 2018-03-03 LAB — COMPREHENSIVE METABOLIC PANEL
A/G RATIO: 2.1 (ref 1.2–2.2)
ALT: 17 IU/L (ref 0–32)
AST: 18 IU/L (ref 0–40)
Albumin: 4.4 g/dL (ref 3.5–5.5)
Alkaline Phosphatase: 98 IU/L (ref 39–117)
BILIRUBIN TOTAL: 0.4 mg/dL (ref 0.0–1.2)
BUN/Creatinine Ratio: 9 (ref 9–23)
BUN: 7 mg/dL (ref 6–20)
CALCIUM: 9.4 mg/dL (ref 8.7–10.2)
CHLORIDE: 105 mmol/L (ref 96–106)
CO2: 23 mmol/L (ref 20–29)
Creatinine, Ser: 0.74 mg/dL (ref 0.57–1.00)
GFR, EST AFRICAN AMERICAN: 127 mL/min/{1.73_m2} (ref >59)
GFR, EST NON AFRICAN AMERICAN: 110 mL/min/{1.73_m2} (ref >59)
GLUCOSE: 79 mg/dL (ref 65–99)
Globulin, Total: 2.1 g/dL (ref 1.5–4.5)
POTASSIUM: 4 mmol/L (ref 3.5–5.2)
Sodium: 140 mmol/L (ref 134–144)
TOTAL PROTEIN: 6.5 g/dL (ref 6.0–8.5)

## 2018-03-03 LAB — CBC
HEMOGLOBIN: 14.7 g/dL (ref 11.1–15.9)
Hematocrit: 43.3 % (ref 34.0–46.6)
MCH: 31.3 pg (ref 26.6–33.0)
MCHC: 33.9 g/dL (ref 31.5–35.7)
MCV: 92 fL (ref 79–97)
Platelets: 306 10*3/uL (ref 150–450)
RBC: 4.7 x10E6/uL (ref 3.77–5.28)
RDW: 12.2 % — ABNORMAL LOW (ref 12.3–15.4)
WBC: 7.9 10*3/uL (ref 3.4–10.8)

## 2018-03-06 LAB — CYTOLOGY - PAP
Chlamydia: NEGATIVE
DIAGNOSIS: NEGATIVE
Neisseria Gonorrhea: NEGATIVE

## 2018-03-14 ENCOUNTER — Encounter: Payer: Self-pay | Admitting: Obstetrics and Gynecology

## 2018-03-14 ENCOUNTER — Ambulatory Visit: Payer: Medicaid Other | Admitting: Obstetrics and Gynecology

## 2018-03-14 VITALS — BP 100/64 | HR 90 | Ht 66.0 in | Wt 183.0 lb

## 2018-03-14 DIAGNOSIS — Z308 Encounter for other contraceptive management: Secondary | ICD-10-CM

## 2018-03-14 DIAGNOSIS — Z975 Presence of (intrauterine) contraceptive device: Secondary | ICD-10-CM

## 2018-03-14 NOTE — Progress Notes (Signed)
Pt is present today to discuss having tubal surgery. Pt stated that she is doing well no complaints.

## 2018-03-14 NOTE — Progress Notes (Signed)
    GYNECOLOGY PROGRESS NOTE  Subjective:    Patient ID: Veronica Harmon, female    DOB: [birth year], 27 y.o.   MRN: [id number]  HPI  Patient is a 27 y.o. 372P0 female who presents for tubal ligation counseling.  She currently has a Nexplanon in place. She desires removal of the Nexplanon due to issues with bad acne and weight gain. Currently no longer desires fertility.   The following portions of the patient's history were reviewed and updated as appropriate: allergies, current medications, past family history, past medical history, past social history, past surgical history and problem list.  Review of Systems Pertinent items noted in HPI and remainder of comprehensive ROS otherwise negative.   Objective:   Blood pressure 100/64, pulse 90, height 5\' 6"  (1.676 m), weight 183 lb (83 kg). General appearance: alert and no distress Remainder of exam deferred.   Assessment:   Encounter for tubal ligation counseling.  Nexplanon in place  Plan:   Patient desires permanent sterilization.  Other reversible forms of contraception were discussed with patient; she declines all other modalities. Risks of procedure discussed with patient including but not limited to: risk of regret, permanence of method, bleeding, infection, injury to surrounding organs and need for additional procedures.  Failure risk of about 1% with increased risk of ectopic gestation if pregnancy occurs was also discussed with patient.  Patient verbalized understanding of these risks and wants to proceed with sterilization.  Signed Medicaid sterilization forms at last visit with Harlow MaresMelody Shambley, CNM on 03/02/2018. Desires Nexplanon removal at the time of her surgery.  Surgery scheduled for 05/07/2018.  To f/u in 7 weeks for pre-op.    A total of 15 minutes were spent face-to-face with the patient during this encounter and over half of that time dealt with counseling and coordination of care.   Hildred Laserherry, Veronica Farler, MD [facility]

## 2018-03-14 NOTE — Patient Instructions (Signed)
Laparoscopic Tubal Ligation Laparoscopic tubal ligation is a procedure to close the fallopian tubes. This is done so that you cannot get pregnant. When the fallopian tubes are closed, the eggs that your ovaries release cannot enter the uterus, and sperm cannot reach the released eggs. A laparoscopic tubal ligation is sometimes called "getting your tubes tied." You should not have this procedure if you want to get pregnant someday or if you are unsure about having more children. Tell a health care provider about:  Any allergies you have.  All medicines you are taking, including vitamins, herbs, eye drops, creams, and over-the-counter medicines.  Any problems you or family members have had with anesthetic medicines.  Any blood disorders you have.  Any surgeries you have had.  Any medical conditions you have.  Whether you are pregnant or may be pregnant.  Any past pregnancies. What are the risks? Generally, this is a safe procedure. However, problems may occur, including:  Infection.  Bleeding.  Injury to surrounding organs.  Side effects from anesthetics.  Failure of the procedure.  This procedure can increase your risk of a kind of pregnancy in which a fertilized egg attaches to the outside of the uterus (ectopic pregnancy). What happens before the procedure?  Ask your health care provider about: ? Changing or stopping your regular medicines. This is especially important if you are taking diabetes medicines or blood thinners. ? Taking medicines such as aspirin and ibuprofen. These medicines can thin your blood. Do not take these medicines before your procedure if your health care provider instructs you not to.  Follow instructions from your health care provider about eating and drinking restrictions.  Plan to have someone take you home after the procedure.  If you go home right after the procedure, plan to have someone with you for 24 hours. What happens during the  procedure?  You will be given one or more of the following: ? A medicine to help you relax (sedative). ? A medicine to numb the area (local anesthetic). ? A medicine to make you fall asleep (general anesthetic). ? A medicine that is injected into an area of your body to numb everything below the injection site (regional anesthetic).  An IV tube will be inserted into one of your veins. It will be used to give you medicines and fluids during the procedure.  Your bladder may be emptied with a small tube (catheter).  If you have been given a general anesthetic, a tube will be put down your throat to help you breathe.  Two small cuts (incisions) will be made in your lower abdomen and near your belly button.  Your abdomen will be inflated with a gas. This will let the surgeon see better and will give the surgeon room to work.  A thin, lighted tube (laparoscope) with a camera attached will be inserted into your abdomen through one of the incisions. Small instruments will be inserted through the other incision.  The fallopian tubes will be tied off, burned (cauterized), or blocked with a clip, ring, or clamp. A small portion in the center of each fallopian tube may be removed.  The gas will be released from the abdomen.  The incisions will be closed with stitches (sutures).  A bandage (dressing) will be placed over the incisions. The procedure may vary among health care providers and hospitals. What happens after the procedure?  Your blood pressure, heart rate, breathing rate, and blood oxygen level will be monitored often until the medicines you   were given have worn off.  You will be given medicine to help with pain, nausea, and vomiting as needed. This information is not intended to replace advice given to you by your health care provider. Make sure you discuss any questions you have with your health care provider. Document Released: 07/25/2000 Document Revised: 09/24/2015 Document  Reviewed: 03/29/2015 Elsevier Interactive Patient Education  2018 Elsevier Inc.  

## 2018-04-17 ENCOUNTER — Other Ambulatory Visit: Payer: Self-pay | Admitting: Student

## 2018-04-17 ENCOUNTER — Other Ambulatory Visit: Payer: Self-pay | Admitting: Obstetrics and Gynecology

## 2018-04-17 DIAGNOSIS — M5412 Radiculopathy, cervical region: Secondary | ICD-10-CM

## 2018-04-30 ENCOUNTER — Encounter: Payer: Medicaid Other | Admitting: Obstetrics and Gynecology

## 2018-05-01 ENCOUNTER — Inpatient Hospital Stay: Admission: RE | Admit: 2018-05-01 | Payer: Medicaid Other | Source: Ambulatory Visit

## 2018-05-01 ENCOUNTER — Encounter: Payer: Medicaid Other | Admitting: Obstetrics and Gynecology

## 2018-05-03 ENCOUNTER — Inpatient Hospital Stay: Admission: RE | Admit: 2018-05-03 | Payer: Medicaid Other | Source: Ambulatory Visit

## 2018-05-04 ENCOUNTER — Inpatient Hospital Stay
Admission: RE | Admit: 2018-05-04 | Discharge: 2018-05-04 | Disposition: A | Discharge status: Home / Self Care | Payer: Medicaid Other | Source: Ambulatory Visit

## 2018-05-04 NOTE — Pre-Procedure Instructions (Addendum)
Called Dr Cherry's office regarding PAT not being able to get in contact with pt and Crystal from Dr Cherry's office states that they cannot get in contact with pt either. Crystal said that pt never showed up for her preop appt at their office and because of this pt will not be having her surgery on 05-07-18

## 2018-05-07 ENCOUNTER — Encounter: Admission: RE | Payer: Self-pay | Source: Home / Self Care

## 2018-05-07 ENCOUNTER — Ambulatory Visit
Admission: RE | Admit: 2018-05-07 | Payer: Medicaid Other | Source: Home / Self Care | Admitting: Obstetrics and Gynecology

## 2018-05-07 SURGERY — LIGATION, FALLOPIAN TUBE, LAPAROSCOPIC
Anesthesia: General | Laterality: Bilateral

## 2018-05-10 ENCOUNTER — Telehealth: Payer: Self-pay | Admitting: Obstetrics and Gynecology

## 2018-05-10 ENCOUNTER — Emergency Department
Admission: EM | Admit: 2018-05-10 | Discharge: 2018-05-10 | Disposition: A | Discharge status: Home / Self Care | Payer: Medicaid Other | Attending: Emergency Medicine | Admitting: Emergency Medicine

## 2018-05-10 ENCOUNTER — Encounter: Payer: Self-pay | Admitting: Emergency Medicine

## 2018-05-10 DIAGNOSIS — R51 Headache: Secondary | ICD-10-CM | POA: Insufficient documentation

## 2018-05-10 DIAGNOSIS — F17210 Nicotine dependence, cigarettes, uncomplicated: Secondary | ICD-10-CM | POA: Insufficient documentation

## 2018-05-10 DIAGNOSIS — R519 Headache, unspecified: Secondary | ICD-10-CM

## 2018-05-10 HISTORY — DX: Other chronic pain: G89.29

## 2018-05-10 HISTORY — DX: Cervicalgia: M54.2

## 2018-05-10 LAB — BASIC METABOLIC PANEL
Anion gap: 9 (ref 5–15)
BUN: 9 mg/dL (ref 6–20)
CHLORIDE: 106 mmol/L (ref 98–111)
CO2: 23 mmol/L (ref 22–32)
CREATININE: 0.69 mg/dL (ref 0.44–1.00)
Calcium: 9 mg/dL (ref 8.9–10.3)
GFR calc non Af Amer: >60 mL/min (ref >60)
Glucose, Bld: 82 mg/dL (ref 70–99)
POTASSIUM: 3.6 mmol/L (ref 3.5–5.1)
SODIUM: 138 mmol/L (ref 135–145)

## 2018-05-10 LAB — CBC
HEMATOCRIT: 42.2 % (ref 36.0–46.0)
HEMOGLOBIN: 14.3 g/dL (ref 12.0–15.0)
MCH: 30.6 pg (ref 26.0–34.0)
MCHC: 33.9 g/dL (ref 30.0–36.0)
MCV: 90.4 fL (ref 80.0–100.0)
NRBC: 0 % (ref 0.0–0.2)
Platelets: 278 10*3/uL (ref 150–400)
RBC: 4.67 MIL/uL (ref 3.87–5.11)
RDW: 11.8 % (ref 11.5–15.5)
WBC: 7.9 10*3/uL (ref 4.0–10.5)

## 2018-05-10 LAB — URINALYSIS, COMPLETE (UACMP) WITH MICROSCOPIC
BACTERIA UA: NONE SEEN
BILIRUBIN URINE: NEGATIVE
Glucose, UA: NEGATIVE mg/dL
Hgb urine dipstick: NEGATIVE
KETONES UR: NEGATIVE mg/dL
LEUKOCYTES UA: NEGATIVE
NITRITE: NEGATIVE
PROTEIN: NEGATIVE mg/dL
SPECIFIC GRAVITY, URINE: 1.003 — AB (ref 1.005–1.030)
pH: 6 (ref 5.0–8.0)

## 2018-05-10 LAB — POCT PREGNANCY, URINE: PREG TEST UR: NEGATIVE

## 2018-05-10 MED ORDER — SODIUM CHLORIDE 0.9 % IV BOLUS
1000.0000 mL | Freq: Once | INTRAVENOUS | Status: AC
  Administered 2018-05-10: 1000 mL via INTRAVENOUS

## 2018-05-10 MED ORDER — DIPHENHYDRAMINE HCL 50 MG/ML IJ SOLN
12.5000 mg | Freq: Once | INTRAMUSCULAR | Status: AC
  Administered 2018-05-10: 12.5 mg via INTRAVENOUS
  Filled 2018-05-10: qty 1

## 2018-05-10 MED ORDER — KETOROLAC TROMETHAMINE 10 MG PO TABS: 10.0000 mg | ORAL_TABLET | Freq: Four times a day (QID) | ORAL | 0 refills | Status: DC | PRN

## 2018-05-10 MED ORDER — METOCLOPRAMIDE HCL 10 MG PO TABS: 10.0000 mg | ORAL_TABLET | Freq: Three times a day (TID) | ORAL | 0 refills | Status: DC

## 2018-05-10 MED ORDER — METOCLOPRAMIDE HCL 5 MG/ML IJ SOLN
10.0000 mg | Freq: Once | INTRAMUSCULAR | Status: AC
  Administered 2018-05-10: 10 mg via INTRAVENOUS
  Filled 2018-05-10: qty 2

## 2018-05-10 MED ORDER — KETOROLAC TROMETHAMINE 30 MG/ML IJ SOLN
30.0000 mg | Freq: Once | INTRAMUSCULAR | Status: AC
  Administered 2018-05-10: 30 mg via INTRAVENOUS
  Filled 2018-05-10: qty 1

## 2018-05-10 NOTE — ED Provider Notes (Signed)
[facility] Emergency Department Provider Note ____________________________________________  Time seen: Approximately 6:30 PM  I have reviewed the triage vital signs and the nursing notes.   HISTORY  Chief Complaint Migraine   HPI Veronica MessierJessalyn Goto is a 28 y.o. female who presents to the emergency department for treatment and evaluation of headache. She has frequent headaches that are "tension" headaches. She will occasionally have a "migraine" that is similar to this, but she can typically manage it with ibuprofen and Excedrin Migraine.   Location: Left forehead and behind left eye. Similar to previous headaches: Yes Duration: Constant TIMING:This AM SEVERITY:8/10 QUALITY:throbbing CONTEXT: no known exposure/trigger MODIFYING FACTORS: none ASSOCIATED SYMPTOMS: Photophobia and nausea Past Medical History:  Diagnosis Date  . Anxiety   . Chlamydia infection   . Chronic neck pain   . Depression     Patient Active Problem List   Diagnosis Date Noted  . Anxiety 12/10/2015    History reviewed. No pertinent surgical history.  Prior to Admission medications   Medication Sig Start Date End Date Taking? Authorizing Provider  albuterol (PROVENTIL HFA;VENTOLIN HFA) 108 (90 Base) MCG/ACT inhaler Inhale 2 puffs into the lungs every 4 (four) hours as needed for wheezing or shortness of breath. Patient not taking: Reported on 04/18/2018 10/26/17   Dionne BucySiadecki, Sebastian, MD  ALPRAZolam Prudy Feeler(XANAX) 1 MG tablet Take 1 tablet (1 mg total) by mouth 3 (three) times daily as needed. Patient taking differently: Take 0.5 mg by mouth 3 (three) times daily as needed for anxiety.  03/02/18   Shambley, Melody N, CNM  aspirin-acetaminophen-caffeine (EXCEDRIN MIGRAINE) 508 187 [id number] MG tablet Take 2 tablets by mouth every 6 (six) hours as needed for headache.    [provider]  buPROPion (WELLBUTRIN XL) 150 MG 24 hr tablet Take 1 tablet (150 mg total) by mouth daily. Patient not  taking: Reported on 04/18/2018 03/02/18   Purcell NailsShambley, Melody N, CNM  etonogestrel (NEXPLANON) 68 MG IMPL implant 1 each by Subdermal route once.    [provider]  ibuprofen (ADVIL,MOTRIN) 200 MG tablet Take 400-600 mg by mouth every 6 (six) hours as needed for headache or moderate pain.    [provider]  ketorolac (TORADOL) 10 MG tablet Take 1 tablet (10 mg total) by mouth every 6 (six) hours as needed. 05/10/18   Jamie Hafford, Rulon Eisenmengerari B, FNP  metoCLOPramide (REGLAN) 10 MG tablet Take 1 tablet (10 mg total) by mouth 3 (three) times daily with meals. 05/10/18 06/09/18  Lanny Donoso, Rulon Eisenmengerari B, FNP  RETIN-A 0.025 % cream Apply 1 application topically 3 (three) times a week. 02/23/18   [provider]    Allergies Allyl isothiocyanate; Sulfa antibiotics; and Vicodin [hydrocodone-acetaminophen]  Family History  Problem Relation Age of Onset  . Cancer Paternal Aunt        breast  . Healthy Mother   . Healthy Father     Social History Social History   Tobacco Use  . Smoking status: Current Every Day Smoker    Packs/day: 0.50    Types: Cigarettes  . Smokeless tobacco: Never Used  Substance Use Topics  . Alcohol use: Not Currently    Comment: occas  . Drug use: No    Review of Systems Constitutional: No fever/chills or recent injury. Eyes: Positive for visual changes ENT: No sore throat. Respiratory: Denies shortness of breath. Gastrointestinal: No abdominal pain.  No nausea, no vomiting.  No diarrhea.  No constipation. Musculoskeletal: Negative for pain. Skin: Negative for rash. Neurological:Positive for headache, Negative for focal  weakness or numbness. No confusion or fainting. ___________________________________________   PHYSICAL EXAM:  VITAL SIGNS: ED Triage Vitals  Enc Vitals Group     BP 05/10/18 1534 121/82     Pulse Rate 05/10/18 1534 95     Resp 05/10/18 1534 16     Temp 05/10/18 1534 98.2 F (36.8 C)     Temp Source 05/10/18 1534 Oral     SpO2  05/10/18 1534 98 %     Weight 05/10/18 1535 170 lb (77.1 kg)     Height 05/10/18 1535 5\' 6"  (1.676 m)     Head Circumference --      Peak Flow --      Pain Score 05/10/18 1543 8     Pain Loc --      Pain Edu? --      Excl. in GC? --     Constitutional: Alert and oriented. Well appearing and in no acute distress. Eyes: Conjunctivae are normal. PERRL. EOMI without expressed pain. No evidence of papilledema on limited exam. Head: Atraumatic. Nose: No congestion/rhinnorhea. Mouth/Throat: Mucous membranes are moist.  Oropharynx non-erythematous. Neck: No stridor. Supple, no meningismus.  Cardiovascular: Normal rate, regular rhythm. Grossly normal heart sounds.  Good peripheral circulation. Respiratory: Normal respiratory effort.  No retractions. Lungs CTAB. Gastrointestinal: Soft and nontender. No distention.  Musculoskeletal: No lower extremity tenderness nor edema.  No joint effusions. Neurologic:  Normal speech and language. No gross focal neurologic deficits are appreciated. No gait instability. Cranial nerves: 2-10 normal as tested. Cerebellar:Normal Romberg, finger-nose-finger, heel to shin, normal gait. Sensorimotor: No aphasia, pronator drift, clonus, sensory loss or abnormal reflexes.  Skin:  Skin is warm, dry and intact. No rash noted. Psychiatric: Mood and affect are normal. Speech and behavior are normal. Normal thought process and cognition.  ____________________________________________   LABS (all labs ordered are listed, but only abnormal results are displayed)  Labs Reviewed  URINALYSIS, COMPLETE (UACMP) WITH MICROSCOPIC - Abnormal; Notable for the following components:      Result Value   Color, Urine STRAW (*)    APPearance CLEAR (*)    Specific Gravity, Urine 1.003 (*)    All other components within normal limits  BASIC METABOLIC PANEL  CBC  POC URINE PREG, ED  POCT PREGNANCY, URINE  CBG MONITORING, ED    ____________________________________________  EKG  Not indicated. ____________________________________________  RADIOLOGY  No results found. ____________________________________________   PROCEDURES  Procedure(s) performed:  Procedures  Critical Care performed: None ____________________________________________   INITIAL IMPRESSION / ASSESSMENT AND PLAN / ED COURSE  28 year old female presenting to the emergency department for treatment and evaluation of headache.  No relief with her usual ibuprofen and Excedrin Migraine.  Headache is similar to previous with the exception of seeing some "blue halos around objects."  She denies any recent head injury.  ----------------------------------------- 6:41 PM on 05/10/2018 ----------------------------------------- Patient reports significant reduction of headache, nausea and visual changes after Benadryl, Reglan, and Toradol.  She states that she feels ready for discharge.  She was encouraged to speak with her neurosurgeon regarding the headaches. She was advised to return to the ER for symptoms that change or worsen if unable to schedule an appointment.   Pertinent labs & imaging results that were available during my care of the patient were reviewed by me and considered in my medical decision making (see chart for details). ____________________________________________   FINAL CLINICAL IMPRESSION(S) / ED DIAGNOSES  Final diagnoses:  Acute intractable headache, unspecified headache type  ED Discharge Orders         Ordered    metoCLOPramide (REGLAN) 10 MG tablet  3 times daily with meals     05/10/18 1858    ketorolac (TORADOL) 10 MG tablet  Every 6 hours PRN    Note to Pharmacy:  Patient had injection in ER   05/10/18 1858            Chinita Pester, FNP 05/10/18 2257    Minna Antis, MD 05/10/18 2315

## 2018-05-10 NOTE — Telephone Encounter (Signed)
Veronica Harmon called at 10:10 AM, and asked if the Med Form was received that he faxed over yesterday.  His call back number is [phone], and he asked for a call back verifying that and if not received, he states he will re-fax today as he needs it ASAP, please advise, thanks.

## 2018-05-10 NOTE — Discharge Instructions (Addendum)
Follow up with either your primary care provider or the neurosurgeon if headaches return and are not well managed with medication.  In addition to the prescription medications, take 25mg  of Benadryl.  Return to the ER for symptoms that change or worsen if unable to schedule an appointment.

## 2018-05-10 NOTE — ED Notes (Signed)
See triage note  Presents with headache  Describes headache as migraine  States she has chronic headache but has not been dx'd with migraines  Has taken tramadol 50 mg this am, excedrin migraine around 11 am and then took IBU 800 mg at 2 pm  Has had some n/v and photosensitivity

## 2018-05-10 NOTE — ED Triage Notes (Signed)
Patient presents to the ED with a migraine.  Patient states for the past month she has had frequent migraines but states today she is having trouble managing and has not been able to get pain to stop.  Patient reports feeling dizzy, having nausea and vomiting x 3 today.  Patient is also photophobic.  Patient holding head during triage.

## 2018-05-10 NOTE — ED Notes (Signed)
Pt verbalizes understanding of discharge instructions.

## 2018-05-25 ENCOUNTER — Other Ambulatory Visit: Payer: Self-pay | Admitting: Neurosurgery

## 2018-05-31 ENCOUNTER — Telehealth: Payer: Self-pay | Admitting: Obstetrics and Gynecology

## 2018-05-31 ENCOUNTER — Other Ambulatory Visit: Payer: Self-pay | Admitting: *Deleted

## 2018-05-31 MED ORDER — ALPRAZOLAM 1 MG PO TABS: 0.5000 mg | ORAL_TABLET | Freq: Three times a day (TID) | ORAL | 2 refills | Status: DC | PRN

## 2018-05-31 NOTE — Telephone Encounter (Signed)
Faxed to [facility]

## 2018-05-31 NOTE — Telephone Encounter (Signed)
The patient called and stated that she needs a refill of the prescription ALPRAZolam (XANAX) 1 MG tablet. The patient did not disclose any other information. Please advise.

## 2018-06-08 NOTE — Telephone Encounter (Signed)
LM to return call.

## 2018-06-20 NOTE — Pre-Procedure Instructions (Signed)
Veronica Harmon  06/20/2018      [id number] - [place], [place] - [address] [place] [address] Phone: [phone] Fax: [phone]    Your procedure is scheduled on June 28, 2018.  Report to [facility] Admitting at 730 AM.  Call this number if you have problems the morning of surgery:  [phone]   Remember:  Do not eat or drink after midnight.    Take these medicines the morning of surgery with A SIP OF WATER  metoCLOPramide (REGLAN)  traMADol (ULTRAM)  ALPRAZolam Prudy Feeler) -if needed albuterol (PROVENTIL HFA;VENTOLIN HFA) inhaler- if needed  7 days prior to surgery STOP taking any Aspirin (unless otherwise instructed by your surgeon),Aspirin-Acetaminophen-Caffeine 500-325-65 MG,  Aleve, Naproxen, Ibuprofen, Motrin, Advil, Goody's, BC's, all herbal medications, fish oil, and all vitamins   Do not wear jewelry, make-up or nail polish.  Do not wear lotions, powders, or perfumes, or deodorant.  Do not shave 48 hours prior to surgery.    Do not bring valuables to the hospital.  [facility] is not responsible for any belongings or valuables.  Contacts, dentures or bridgework may not be worn into surgery.  Leave your suitcase in the car.  After surgery it may be brought to your room.  For patients admitted to the hospital, discharge time will be determined by your treatment team.  Patients discharged the day of surgery will not be allowed to drive home.    [place]- Preparing For Surgery  Before surgery, you can play an important role. Because skin is not sterile, your skin needs to be as free of germs as possible. You can reduce the number of germs on your skin by washing with CHG (chlorahexidine gluconate) Soap before surgery.  CHG is an antiseptic cleaner which kills germs and bonds with the skin to continue killing germs even after washing.    Oral Hygiene is also important to reduce your risk of infection.  Remember - BRUSH YOUR  TEETH THE MORNING OF SURGERY WITH YOUR REGULAR TOOTHPASTE  Please do not use if you have an allergy to CHG or antibacterial soaps. If your skin becomes reddened/irritated stop using the CHG.  Do not shave (including legs and underarms) for at least 48 hours prior to first CHG shower. It is OK to shave your face.  Please follow these instructions carefully.   1. Shower the NIGHT BEFORE SURGERY and the MORNING OF SURGERY with CHG.   2. If you chose to wash your hair, wash your hair first as usual with your normal shampoo.  3. After you shampoo, rinse your hair and body thoroughly to remove the shampoo.  4. Use CHG as you would any other liquid soap. You can apply CHG directly to the skin and wash gently with a scrungie or a clean washcloth.   5. Apply the CHG Soap to your body ONLY FROM THE NECK DOWN.  Do not use on open wounds or open sores. Avoid contact with your eyes, ears, mouth and genitals (private parts). Wash Face and genitals (private parts)  with your normal soap.  6. Wash thoroughly, paying special attention to the area where your surgery will be performed.  7. Thoroughly rinse your body with warm water from the neck down.  8. DO NOT shower/wash with your normal soap after using and rinsing off the CHG Soap.  9. Pat yourself dry with a CLEAN TOWEL.  10. Wear CLEAN PAJAMAS to bed the  night before surgery, wear comfortable clothes the morning of surgery  11. Place CLEAN SHEETS on your bed the night of your first shower and DO NOT SLEEP WITH PETS.   Day of Surgery:  Do not apply any deodorants/lotions.  Please wear clean clothes to the hospital/surgery center.   Remember to brush your teeth WITH YOUR REGULAR TOOTHPASTE.   Please read over the following fact sheets that you were given.

## 2018-06-20 NOTE — Progress Notes (Addendum)
PCP - [facility] Cardiologist - pt denies  Chest x-ray - 10/26/2017 in EP[facility] EKG - 05/11/2018 in EP[facility]  Stress Test - pt denies ECHO - pt denies  Cardiac Cath - pt denies  Sleep Study - pt denies CPAP - pt denies  Fasting Blood Sugar - n/a Checks Blood Sugar _____ times a day-n/a  Blood Thinner Instructions: n/a Aspirin Instructions: n/a  Anesthesia review:  no  Patient denies shortness of breath, fever, cough and chest pain at PAT appointment  Patient verbalized understanding of instructions that were given to them at the PAT appointment. Patient was also instructed that they will need to review over the PAT instructions again at home before surgery.

## 2018-06-21 ENCOUNTER — Encounter (HOSPITAL_COMMUNITY)
Admission: RE | Admit: 2018-06-21 | Discharge: 2018-06-21 | Disposition: A | Discharge status: Home / Self Care | Payer: Medicaid Other | Source: Ambulatory Visit | Attending: Neurosurgery | Admitting: Neurosurgery

## 2018-06-21 ENCOUNTER — Other Ambulatory Visit: Payer: Self-pay

## 2018-06-21 ENCOUNTER — Encounter (HOSPITAL_COMMUNITY): Payer: Self-pay

## 2018-06-21 DIAGNOSIS — Z01812 Encounter for preprocedural laboratory examination: Secondary | ICD-10-CM | POA: Insufficient documentation

## 2018-06-21 LAB — BASIC METABOLIC PANEL
Anion gap: 10 (ref 5–15)
BUN: 6 mg/dL (ref 6–20)
CO2: 21 mmol/L — ABNORMAL LOW (ref 22–32)
CREATININE: 0.71 mg/dL (ref 0.44–1.00)
Calcium: 9.2 mg/dL (ref 8.9–10.3)
Chloride: 105 mmol/L (ref 98–111)
GFR calc Af Amer: >60 mL/min (ref >60)
GFR calc non Af Amer: >60 mL/min (ref >60)
Glucose, Bld: 92 mg/dL (ref 70–99)
Potassium: 3.9 mmol/L (ref 3.5–5.1)
Sodium: 136 mmol/L (ref 135–145)

## 2018-06-21 LAB — SURGICAL PCR SCREEN
MRSA, PCR: NEGATIVE
Staphylococcus aureus: POSITIVE — AB

## 2018-06-21 LAB — TYPE AND SCREEN
ABO/RH(D): O POS
Antibody Screen: NEGATIVE

## 2018-06-21 LAB — CBC
HCT: 44.9 % (ref 36.0–46.0)
Hemoglobin: 14.9 g/dL (ref 12.0–15.0)
MCH: 30.3 pg (ref 26.0–34.0)
MCHC: 33.2 g/dL (ref 30.0–36.0)
MCV: 91.4 fL (ref 80.0–100.0)
Platelets: 334 10*3/uL (ref 150–400)
RBC: 4.91 MIL/uL (ref 3.87–5.11)
RDW: 11.9 % (ref 11.5–15.5)
WBC: 9.2 10*3/uL (ref 4.0–10.5)
nRBC: 0 % (ref 0.0–0.2)

## 2018-06-21 LAB — ABO/RH: ABO/RH(D): O POS

## 2018-06-21 NOTE — Progress Notes (Signed)
Patient's surgical PCR positive for Staph. Called and informed patient and call mupirocin ointment in to [facility] in [place], [place] per patient request

## 2018-06-28 ENCOUNTER — Encounter (HOSPITAL_COMMUNITY): Payer: Self-pay | Admitting: Anesthesiology

## 2018-06-28 ENCOUNTER — Ambulatory Visit (HOSPITAL_COMMUNITY): Payer: Medicaid Other

## 2018-06-28 ENCOUNTER — Encounter (HOSPITAL_COMMUNITY)
Admission: RE | Disposition: A | Discharge status: Home / Self Care | Payer: Self-pay | Source: Home / Self Care | Attending: Neurosurgery

## 2018-06-28 ENCOUNTER — Ambulatory Visit (HOSPITAL_COMMUNITY): Payer: Medicaid Other | Admitting: Anesthesiology

## 2018-06-28 ENCOUNTER — Ambulatory Visit (HOSPITAL_COMMUNITY)
Admission: RE | Admit: 2018-06-28 | Discharge: 2018-06-28 | Disposition: A | Discharge status: Home / Self Care | Payer: Medicaid Other | Attending: Neurosurgery | Admitting: Neurosurgery

## 2018-06-28 ENCOUNTER — Other Ambulatory Visit: Payer: Self-pay

## 2018-06-28 DIAGNOSIS — M50122 Cervical disc disorder at C5-C6 level with radiculopathy: Secondary | ICD-10-CM | POA: Insufficient documentation

## 2018-06-28 DIAGNOSIS — Z79899 Other long term (current) drug therapy: Secondary | ICD-10-CM | POA: Diagnosis not present

## 2018-06-28 DIAGNOSIS — Z882 Allergy status to sulfonamides status: Secondary | ICD-10-CM | POA: Insufficient documentation

## 2018-06-28 DIAGNOSIS — F329 Major depressive disorder, single episode, unspecified: Secondary | ICD-10-CM | POA: Insufficient documentation

## 2018-06-28 DIAGNOSIS — M5020 Other cervical disc displacement, unspecified cervical region: Secondary | ICD-10-CM | POA: Diagnosis present

## 2018-06-28 DIAGNOSIS — Z419 Encounter for procedure for purposes other than remedying health state, unspecified: Secondary | ICD-10-CM

## 2018-06-28 DIAGNOSIS — F419 Anxiety disorder, unspecified: Secondary | ICD-10-CM | POA: Diagnosis not present

## 2018-06-28 DIAGNOSIS — Z7982 Long term (current) use of aspirin: Secondary | ICD-10-CM | POA: Insufficient documentation

## 2018-06-28 DIAGNOSIS — F17210 Nicotine dependence, cigarettes, uncomplicated: Secondary | ICD-10-CM | POA: Diagnosis not present

## 2018-06-28 DIAGNOSIS — Z885 Allergy status to narcotic agent status: Secondary | ICD-10-CM | POA: Insufficient documentation

## 2018-06-28 HISTORY — PX: CERVICAL DISC ARTHROPLASTY: SHX587

## 2018-06-28 LAB — POCT PREGNANCY, URINE: Preg Test, Ur: NEGATIVE

## 2018-06-28 SURGERY — CERVICAL ANTERIOR DISC ARTHROPLASTY
Anesthesia: General

## 2018-06-28 MED ORDER — METOCLOPRAMIDE HCL 5 MG PO TABS
10.0000 mg | ORAL_TABLET | Freq: Three times a day (TID) | ORAL | Status: DC
  Filled 2018-06-28: qty 2

## 2018-06-28 MED ORDER — PANTOPRAZOLE SODIUM 40 MG IV SOLR: 40.0000 mg | Freq: Every day | INTRAVENOUS | Status: DC

## 2018-06-28 MED ORDER — BUPIVACAINE-EPINEPHRINE (PF) 0.5% -1:200000 IJ SOLN
INTRAMUSCULAR | Status: DC | PRN
  Administered 2018-06-28: 10 mL

## 2018-06-28 MED ORDER — ONDANSETRON HCL 4 MG/2ML IJ SOLN: 4.0000 mg | Freq: Four times a day (QID) | INTRAMUSCULAR | Status: DC | PRN

## 2018-06-28 MED ORDER — BACITRACIN ZINC 500 UNIT/GM EX OINT
TOPICAL_OINTMENT | CUTANEOUS | Status: DC | PRN
  Administered 2018-06-28: 1 via TOPICAL

## 2018-06-28 MED ORDER — SUGAMMADEX SODIUM 200 MG/2ML IV SOLN
INTRAVENOUS | Status: DC | PRN
  Administered 2018-06-28: 200 mg via INTRAVENOUS

## 2018-06-28 MED ORDER — BACITRACIN ZINC 500 UNIT/GM EX OINT
TOPICAL_OINTMENT | CUTANEOUS | Status: AC
  Filled 2018-06-28: qty 28.35

## 2018-06-28 MED ORDER — ACETAMINOPHEN 160 MG/5ML PO SOLN: 325.0000 mg | Freq: Once | ORAL | Status: DC

## 2018-06-28 MED ORDER — LACTATED RINGERS IV SOLN: INTRAVENOUS | Status: DC

## 2018-06-28 MED ORDER — OXYCODONE HCL 5 MG PO TABS
10.0000 mg | ORAL_TABLET | ORAL | Status: DC | PRN
  Administered 2018-06-28 (×2): 10 mg via ORAL
  Filled 2018-06-28 (×2): qty 2

## 2018-06-28 MED ORDER — ONDANSETRON HCL 4 MG PO TABS: 4.0000 mg | ORAL_TABLET | Freq: Four times a day (QID) | ORAL | Status: DC | PRN

## 2018-06-28 MED ORDER — ALPRAZOLAM 0.5 MG PO TABS: 0.5000 mg | ORAL_TABLET | Freq: Three times a day (TID) | ORAL | Status: DC | PRN

## 2018-06-28 MED ORDER — DEXAMETHASONE SODIUM PHOSPHATE 10 MG/ML IJ SOLN
INTRAMUSCULAR | Status: DC | PRN
  Administered 2018-06-28: 10 mg via INTRAVENOUS

## 2018-06-28 MED ORDER — CHLORHEXIDINE GLUCONATE CLOTH 2 % EX PADS: 6.0000 | MEDICATED_PAD | Freq: Once | CUTANEOUS | Status: DC

## 2018-06-28 MED ORDER — SODIUM CHLORIDE 0.9 % IV SOLN
INTRAVENOUS | Status: DC | PRN
  Administered 2018-06-28: 11:00:00

## 2018-06-28 MED ORDER — THROMBIN 5000 UNITS EX SOLR
CUTANEOUS | Status: AC
  Filled 2018-06-28: qty 5000

## 2018-06-28 MED ORDER — PHENYLEPHRINE 40 MCG/ML (10ML) SYRINGE FOR IV PUSH (FOR BLOOD PRESSURE SUPPORT)
PREFILLED_SYRINGE | INTRAVENOUS | Status: AC
  Filled 2018-06-28: qty 10

## 2018-06-28 MED ORDER — DEXAMETHASONE SODIUM PHOSPHATE 10 MG/ML IJ SOLN
INTRAMUSCULAR | Status: AC
  Filled 2018-06-28: qty 1

## 2018-06-28 MED ORDER — DOCUSATE SODIUM 100 MG PO CAPS: 100.0000 mg | ORAL_CAPSULE | Freq: Two times a day (BID) | ORAL | Status: DC

## 2018-06-28 MED ORDER — MEPERIDINE HCL 50 MG/ML IJ SOLN: 6.2500 mg | INTRAMUSCULAR | Status: DC | PRN

## 2018-06-28 MED ORDER — ZOLPIDEM TARTRATE 5 MG PO TABS: 5.0000 mg | ORAL_TABLET | Freq: Every evening | ORAL | Status: DC | PRN

## 2018-06-28 MED ORDER — 0.9 % SODIUM CHLORIDE (POUR BTL) OPTIME
TOPICAL | Status: DC | PRN
  Administered 2018-06-28: 1000 mL

## 2018-06-28 MED ORDER — ONDANSETRON HCL 4 MG/2ML IJ SOLN
INTRAMUSCULAR | Status: AC
  Filled 2018-06-28: qty 2

## 2018-06-28 MED ORDER — CYCLOBENZAPRINE HCL 10 MG PO TABS
10.0000 mg | ORAL_TABLET | Freq: Three times a day (TID) | ORAL | Status: DC | PRN
  Administered 2018-06-28: 10 mg via ORAL
  Filled 2018-06-28: qty 1

## 2018-06-28 MED ORDER — LIDOCAINE 2% (20 MG/ML) 5 ML SYRINGE
INTRAMUSCULAR | Status: DC | PRN
  Administered 2018-06-28: 50 mg via INTRAVENOUS

## 2018-06-28 MED ORDER — CYCLOBENZAPRINE HCL 10 MG PO TABS: 10.0000 mg | ORAL_TABLET | Freq: Three times a day (TID) | ORAL | 0 refills | Status: DC | PRN

## 2018-06-28 MED ORDER — ALUM & MAG HYDROXIDE-SIMETH 200-200-20 MG/5ML PO SUSP: 30.0000 mL | Freq: Four times a day (QID) | ORAL | Status: DC | PRN

## 2018-06-28 MED ORDER — OXYCODONE HCL 10 MG PO TABS: 10.0000 mg | ORAL_TABLET | ORAL | 0 refills | Status: DC | PRN

## 2018-06-28 MED ORDER — PROPOFOL 10 MG/ML IV BOLUS
INTRAVENOUS | Status: AC
  Filled 2018-06-28: qty 20

## 2018-06-28 MED ORDER — FENTANYL CITRATE (PF) 250 MCG/5ML IJ SOLN
INTRAMUSCULAR | Status: AC
  Filled 2018-06-28: qty 5

## 2018-06-28 MED ORDER — ACETAMINOPHEN 650 MG RE SUPP: 650.0000 mg | RECTAL | Status: DC | PRN

## 2018-06-28 MED ORDER — HEMOSTATIC AGENTS (NO CHARGE) OPTIME
TOPICAL | Status: DC | PRN
  Administered 2018-06-28: 1 via TOPICAL

## 2018-06-28 MED ORDER — ACETAMINOPHEN 325 MG PO TABS: 325.0000 mg | ORAL_TABLET | Freq: Once | ORAL | Status: DC

## 2018-06-28 MED ORDER — MORPHINE SULFATE (PF) 4 MG/ML IV SOLN: 4.0000 mg | INTRAVENOUS | Status: DC | PRN

## 2018-06-28 MED ORDER — PROPOFOL 10 MG/ML IV BOLUS
INTRAVENOUS | Status: DC | PRN
  Administered 2018-06-28: 150 mg via INTRAVENOUS

## 2018-06-28 MED ORDER — ACETAMINOPHEN 500 MG PO TABS
1000.0000 mg | ORAL_TABLET | Freq: Four times a day (QID) | ORAL | Status: DC
  Administered 2018-06-28: 1000 mg via ORAL
  Filled 2018-06-28: qty 2

## 2018-06-28 MED ORDER — PROMETHAZINE HCL 25 MG/ML IJ SOLN
6.2500 mg | INTRAMUSCULAR | Status: DC | PRN
  Administered 2018-06-28: 6.25 mg via INTRAVENOUS

## 2018-06-28 MED ORDER — ACETAMINOPHEN 10 MG/ML IV SOLN: 1000.0000 mg | Freq: Once | INTRAVENOUS | Status: DC | PRN

## 2018-06-28 MED ORDER — MENTHOL 3 MG MT LOZG: 1.0000 | LOZENGE | OROMUCOSAL | Status: DC | PRN

## 2018-06-28 MED ORDER — MIDAZOLAM HCL 2 MG/2ML IJ SOLN
INTRAMUSCULAR | Status: DC | PRN
  Administered 2018-06-28: 2 mg via INTRAVENOUS

## 2018-06-28 MED ORDER — ROCURONIUM BROMIDE 10 MG/ML (PF) SYRINGE
PREFILLED_SYRINGE | INTRAVENOUS | Status: DC | PRN
  Administered 2018-06-28: 50 mg via INTRAVENOUS
  Administered 2018-06-28: 20 mg via INTRAVENOUS

## 2018-06-28 MED ORDER — ONDANSETRON HCL 4 MG/2ML IJ SOLN
INTRAMUSCULAR | Status: DC | PRN
  Administered 2018-06-28: 4 mg via INTRAVENOUS

## 2018-06-28 MED ORDER — DEXAMETHASONE 4 MG PO TABS
4.0000 mg | ORAL_TABLET | Freq: Four times a day (QID) | ORAL | Status: DC
  Administered 2018-06-28: 4 mg via ORAL
  Filled 2018-06-28: qty 1

## 2018-06-28 MED ORDER — BUPIVACAINE-EPINEPHRINE (PF) 0.5% -1:200000 IJ SOLN
INTRAMUSCULAR | Status: AC
  Filled 2018-06-28: qty 30

## 2018-06-28 MED ORDER — THROMBIN 5000 UNITS EX SOLR
CUTANEOUS | Status: AC
  Filled 2018-06-28: qty 10000

## 2018-06-28 MED ORDER — ADULT MULTIVITAMIN W/MINERALS CH: 1.0000 | ORAL_TABLET | Freq: Every day | ORAL | Status: DC

## 2018-06-28 MED ORDER — DEXAMETHASONE SODIUM PHOSPHATE 4 MG/ML IJ SOLN: 4.0000 mg | Freq: Four times a day (QID) | INTRAMUSCULAR | Status: DC

## 2018-06-28 MED ORDER — HYDROMORPHONE HCL 1 MG/ML IJ SOLN
0.2500 mg | INTRAMUSCULAR | Status: DC | PRN
  Administered 2018-06-28 (×2): 0.5 mg via INTRAVENOUS

## 2018-06-28 MED ORDER — LACTATED RINGERS IV SOLN
INTRAVENOUS | Status: DC | PRN
  Administered 2018-06-28 (×2): via INTRAVENOUS

## 2018-06-28 MED ORDER — LIDOCAINE 2% (20 MG/ML) 5 ML SYRINGE
INTRAMUSCULAR | Status: AC
  Filled 2018-06-28: qty 5

## 2018-06-28 MED ORDER — TRAMADOL HCL 50 MG PO TABS: 50.0000 mg | ORAL_TABLET | Freq: Every day | ORAL | Status: DC

## 2018-06-28 MED ORDER — ACETAMINOPHEN 325 MG PO TABS: 650.0000 mg | ORAL_TABLET | ORAL | Status: DC | PRN

## 2018-06-28 MED ORDER — LACTATED RINGERS IV SOLN
INTRAVENOUS | Status: DC
  Administered 2018-06-28: 09:00:00 via INTRAVENOUS

## 2018-06-28 MED ORDER — OXYCODONE HCL 5 MG PO TABS: 5.0000 mg | ORAL_TABLET | ORAL | Status: DC | PRN

## 2018-06-28 MED ORDER — FENTANYL CITRATE (PF) 250 MCG/5ML IJ SOLN
INTRAMUSCULAR | Status: DC | PRN
  Administered 2018-06-28: 50 ug via INTRAVENOUS
  Administered 2018-06-28: 150 ug via INTRAVENOUS

## 2018-06-28 MED ORDER — MIDAZOLAM HCL 2 MG/2ML IJ SOLN
INTRAMUSCULAR | Status: AC
  Filled 2018-06-28: qty 2

## 2018-06-28 MED ORDER — PHENYLEPHRINE 40 MCG/ML (10ML) SYRINGE FOR IV PUSH (FOR BLOOD PRESSURE SUPPORT)
PREFILLED_SYRINGE | INTRAVENOUS | Status: DC | PRN
  Administered 2018-06-28: 60 ug via INTRAVENOUS
  Administered 2018-06-28: 80 ug via INTRAVENOUS

## 2018-06-28 MED ORDER — BISACODYL 10 MG RE SUPP: 10.0000 mg | Freq: Every day | RECTAL | Status: DC | PRN

## 2018-06-28 MED ORDER — CEFAZOLIN SODIUM-DEXTROSE 2-4 GM/100ML-% IV SOLN
2.0000 g | Freq: Three times a day (TID) | INTRAVENOUS | Status: DC
  Administered 2018-06-28: 2 g via INTRAVENOUS
  Filled 2018-06-28: qty 100

## 2018-06-28 MED ORDER — ROCURONIUM BROMIDE 50 MG/5ML IV SOSY
PREFILLED_SYRINGE | INTRAVENOUS | Status: AC
  Filled 2018-06-28: qty 10

## 2018-06-28 MED ORDER — PROMETHAZINE HCL 25 MG/ML IJ SOLN
INTRAMUSCULAR | Status: AC
  Filled 2018-06-28: qty 1

## 2018-06-28 MED ORDER — THROMBIN 5000 UNITS EX SOLR
OROMUCOSAL | Status: DC | PRN
  Administered 2018-06-28: 11:00:00 via TOPICAL

## 2018-06-28 MED ORDER — CEFAZOLIN SODIUM-DEXTROSE 2-4 GM/100ML-% IV SOLN
2.0000 g | INTRAVENOUS | Status: AC
  Administered 2018-06-28: 2 g via INTRAVENOUS
  Filled 2018-06-28: qty 100

## 2018-06-28 MED ORDER — HYDROMORPHONE HCL 1 MG/ML IJ SOLN
INTRAMUSCULAR | Status: AC
  Filled 2018-06-28: qty 1

## 2018-06-28 MED ORDER — THROMBIN 5000 UNITS EX SOLR
CUTANEOUS | Status: DC | PRN
  Administered 2018-06-28: 10000 [IU] via TOPICAL

## 2018-06-28 MED ORDER — PHENOL 1.4 % MT LIQD: 1.0000 | OROMUCOSAL | Status: DC | PRN

## 2018-06-28 SURGICAL SUPPLY — 56 items
APL SKNCLS STERI-STRIP NONHPOA (GAUZE/BANDAGES/DRESSINGS) ×1
BAG DECANTER FOR FLEXI CONT (MISCELLANEOUS) ×3 IMPLANT
BENZOIN TINCTURE PRP APPL 2/3 (GAUZE/BANDAGES/DRESSINGS) ×4 IMPLANT
BIT DRILL NEURO 2X3.1 SFT TUCH (MISCELLANEOUS) ×1 IMPLANT
BLADE SURG 15 STRL LF DISP TIS (BLADE) ×1 IMPLANT
BLADE SURG 15 STRL SS (BLADE) ×3
BLADE ULTRA TIP 2M (BLADE) ×3 IMPLANT
BUR BARREL STRAIGHT FLUTE 4.0 (BURR) ×3 IMPLANT
BUR MATCHSTICK NEURO 3.0 LAGG (BURR) ×3 IMPLANT
CANISTER SUCT 3000ML PPV (MISCELLANEOUS) ×3 IMPLANT
CARTRIDGE OIL MAESTRO DRILL (MISCELLANEOUS) ×1 IMPLANT
CLOSURE WOUND 1/2 X4 (GAUZE/BANDAGES/DRESSINGS) ×1
COVER MAYO STAND STRL (DRAPES) ×3 IMPLANT
COVER WAND RF STERILE (DRAPES) IMPLANT
DIFFUSER DRILL AIR PNEUMATIC (MISCELLANEOUS) ×3 IMPLANT
DISC MOBI-C CERVICAL 13X15 H5 (Miscellaneous) ×3 IMPLANT
DRAPE C-ARM 42X72 X-RAY (DRAPES) ×6 IMPLANT
DRAPE LAPAROTOMY 100X72 PEDS (DRAPES) ×3 IMPLANT
DRAPE MICROSCOPE LEICA (MISCELLANEOUS) ×2 IMPLANT
DRAPE POUCH INSTRU U-SHP 10X18 (DRAPES) ×3 IMPLANT
DRAPE SURG 17X23 STRL (DRAPES) ×6 IMPLANT
DRILL NEURO 2X3.1 SOFT TOUCH (MISCELLANEOUS) ×3
DRSG OPSITE POSTOP 4X6 (GAUZE/BANDAGES/DRESSINGS) ×3 IMPLANT
ELECT REM PT RETURN 9FT ADLT (ELECTROSURGICAL) ×3
ELECTRODE REM PT RTRN 9FT ADLT (ELECTROSURGICAL) ×1 IMPLANT
GAUZE 4X4 16PLY RFD (DISPOSABLE) IMPLANT
GAUZE SPONGE 4X4 12PLY STRL (GAUZE/BANDAGES/DRESSINGS) ×3 IMPLANT
GLOVE BIO SURGEON STRL SZ8 (GLOVE) ×3 IMPLANT
GLOVE BIO SURGEON STRL SZ8.5 (GLOVE) ×3 IMPLANT
GLOVE EXAM NITRILE XL STR (GLOVE) IMPLANT
GOWN STRL REUS W/ TWL LRG LVL3 (GOWN DISPOSABLE) IMPLANT
GOWN STRL REUS W/ TWL XL LVL3 (GOWN DISPOSABLE) ×1 IMPLANT
GOWN STRL REUS W/TWL LRG LVL3 (GOWN DISPOSABLE)
GOWN STRL REUS W/TWL XL LVL3 (GOWN DISPOSABLE) ×3
HEMOSTAT POWDER KIT SURGIFOAM (HEMOSTASIS) ×3 IMPLANT
KIT BASIN OR (CUSTOM PROCEDURE TRAY) ×3 IMPLANT
KIT TURNOVER KIT B (KITS) ×3 IMPLANT
MARKER SKIN DUAL TIP RULER LAB (MISCELLANEOUS) ×3 IMPLANT
NDL SPNL 18GX3.5 QUINCKE PK (NEEDLE) ×1 IMPLANT
NEEDLE HYPO 22GX1.5 SAFETY (NEEDLE) ×3 IMPLANT
NEEDLE SPNL 18GX3.5 QUINCKE PK (NEEDLE) ×3 IMPLANT
NS IRRIG 1000ML POUR BTL (IV SOLUTION) ×3 IMPLANT
OIL CARTRIDGE MAESTRO DRILL (MISCELLANEOUS) ×3
PACK LAMINECTOMY NEURO (CUSTOM PROCEDURE TRAY) ×3 IMPLANT
PIN DISTRACTION 14MM (PIN) ×6 IMPLANT
RUBBERBAND STERILE (MISCELLANEOUS) ×4 IMPLANT
SPONGE INTESTINAL PEANUT (DISPOSABLE) ×6 IMPLANT
SPONGE SURGIFOAM ABS GEL SZ50 (HEMOSTASIS) ×2 IMPLANT
STRIP CLOSURE SKIN 1/2X4 (GAUZE/BANDAGES/DRESSINGS) ×2 IMPLANT
SUT VIC AB 0 CT1 27 (SUTURE) ×6
SUT VIC AB 0 CT1 27XBRD ANBCTR (SUTURE) IMPLANT
SUT VIC AB 0 CT1 27XBRD ANTBC (SUTURE) ×1 IMPLANT
SUT VIC AB 3-0 SH 8-18 (SUTURE) ×3 IMPLANT
TOWEL GREEN STERILE (TOWEL DISPOSABLE) ×3 IMPLANT
TOWEL GREEN STERILE FF (TOWEL DISPOSABLE) ×3 IMPLANT
WATER STERILE IRR 1000ML POUR (IV SOLUTION) ×3 IMPLANT

## 2018-06-28 NOTE — Progress Notes (Signed)
Pt doing well. Pt given D/C instructions with verbal understanding. Pt's incision is clean and dry with no sign of infection. Pt's IV was removed prior to D/C. Pt D/C'd home via wheelchair @ 1925 per MD order. Pt is stable @ D/C and has no other needs at this time. Rema Fendt, RN

## 2018-06-28 NOTE — Op Note (Signed)
Brief history: The patient is a 28 year old white female who is complained of neck and left arm pain consistent with a cervical radiculopathy.  She has failed medical management and was worked up with a cervical MRI.  This demonstrated a herniated disc at C5-6.  I discussed the various treatment options with the patient.  She has decided proceed with a C5-6 discectomy and disc arthroplasty after weighing the risks, benefits and alternatives of surgery.  Preoperative diagnosis: C5-6 herniated disc, cervicalgia, cervical radiculopathy  Postoperative diagnosis: The same  Procedure: C5-6 anterior cervical discectomy/decompression; C5-6 disc arthroplasty with Zimmer artificial disc  Surgeon: Dr. Delma Officer  Asst.: Dr. Ervin Knack and Hildred Priest nurse practitioner  Anesthesia: Gen. endotracheal  Estimated blood loss: Minimal  Drains: None  Complications: None  Description of procedure: The patient was brought to the operating room by the anesthesia team. General endotracheal anesthesia was induced. A roll was placed under the patient's shoulders to keep the neck in the neutral position. The patient's anterior cervical region was then prepared with Betadine scrub and Betadine solution. Sterile drapes were applied.  The area to be incised was then injected with Marcaine with epinephrine solution. I then used a scalpel to make a transverse incision in the patient's left anterior neck. I used the Metzenbaum scissors to divide the platysmal muscle and then to dissect medial to the sternocleidomastoid muscle, jugular vein, and carotid artery. I carefully dissected down towards the anterior cervical spine identifying the esophagus and retracting it medially. Then using Kitner swabs to clear soft tissue from the anterior cervical spine. We then inserted a bent spinal needle into the upper exposed intervertebral disc space. We then obtained intraoperative radiographs confirm our location.  I then  used electrocautery to detach the medial border of the longus colli muscle bilaterally from the C5-6 intervertebral disc spaces. I then inserted the Caspar self-retaining retractor underneath the longus colli muscle bilaterally to provide exposure.  We then incised the intervertebral disc at C5-6. We then performed a partial intervertebral discectomy with a pituitary forceps and the Karlin curettes. I then inserted distraction screws into the vertebral bodies at C5-6. We then distracted the interspace. We then used the high-speed drill to drill away the remainder of the intervertebral disc, to drill away some posterior spondylosis, and to thin out the posterior longitudinal ligament. I then incised ligament with the arachnoid knife. We then removed the ligament with a Kerrison punches undercutting the vertebral endplates and decompressing the thecal sac. We then performed foraminotomies about the bilateral C6 nerve roots. This completed the decompression at this level.  Having completed the decompression we now turned our attention to the disc arthroplasty.  We inserted the trial disc spacer.  We used intraoperative fluoroscopy to confirm it was a good size.  We then inserted the Zimmer artificial disc into the distracted C5-6 interspace.  We confirmed its good position with AP and lateral fluoroscopy.  We then remove the inserter.  We then removed distraction screws and placed bone wax in the holes.  We then obtained hemostasis using bipolar electrocautery. We irrigated the wound out with bacitracin solution. We then removed the retractor. We inspected the esophagus for any damage. There was none apparent. We then reapproximated patient's platysmal muscle with interrupted 3-0 Vicryl suture. We then reapproximated the subcutaneous tissue with interrupted 3-0 Vicryl suture. The skin was reapproximated with Steri-Strips and benzoin. The wound was then covered with bacitracin ointment. A sterile dressing was  applied. The drapes were removed.  Patient was subsequently extubated by the anesthesia team and transported to the post anesthesia care unit in stable condition. All sponge instrument and needle counts were reportedly correct at the end of this case.

## 2018-06-28 NOTE — Progress Notes (Signed)
Orthopedic Tech Progress Note Patient Details:  Veronica Harmon 1991/04/23 [id number]  Ortho Devices Type of Ortho Device: Soft collar Ortho Device/Splint Interventions: Ordered, Application, Adjustment   Post Interventions Patient Tolerated: Well Instructions Provided: Adjustment of device, Care of device   Vidya Bamford J [name] 06/28/2018, 1:53 PM

## 2018-06-28 NOTE — Transfer of Care (Signed)
Immediate Anesthesia Transfer of Care Note  Patient: Veronica Harmon  Procedure(s) Performed: Cervical five-six Artificial disc replacement (N/A )  Patient Location: PACU  Anesthesia Type:General  Level of Consciousness: awake, alert  and oriented  Airway & Oxygen Therapy: Patient Spontanous Breathing and Patient connected to face mask oxygen  Post-op Assessment: Report given to RN and Post -op Vital signs reviewed and stable  Post vital signs: Reviewed and stable  Last Vitals:  Vitals Value Taken Time  BP 114/89 06/28/2018 12:46 PM  Temp 36.5 C 06/28/2018 12:45 PM  Pulse 98 06/28/2018 12:48 PM  Resp 11 06/28/2018 12:48 PM  SpO2 99 % 06/28/2018 12:48 PM  Vitals shown include unvalidated device data.  Last Pain:  Vitals:   06/28/18 1245  TempSrc:   PainSc: (P) 4       Patients Stated Pain Goal: 3 (06/28/18 0848)  Complications: No apparent anesthesia complications

## 2018-06-28 NOTE — Anesthesia Procedure Notes (Signed)
Procedure Name: Intubation Date/Time: 06/28/2018 10:56 AM Performed by: Izola Price., CRNA Pre-anesthesia Checklist: Patient identified, Emergency Drugs available, Suction available and Patient being monitored Patient Re-evaluated:Patient Re-evaluated prior to induction Oxygen Delivery Method: Circle system utilized Preoxygenation: Pre-oxygenation with 100% oxygen Induction Type: IV induction Ventilation: Mask ventilation without difficulty Laryngoscope Size: Glidescope and 3 Grade View: Grade I Tube type: Oral Tube size: 7.0 mm Number of attempts: 1 Airway Equipment and Method: Stylet Placement Confirmation: ETT inserted through vocal cords under direct vision,  positive ETCO2 and breath sounds checked- equal and bilateral Secured at: 21 cm Tube secured with: Tape Dental Injury: Teeth and Oropharynx as per pre-operative assessment  Comments: Elective glidescope

## 2018-06-28 NOTE — Discharge Summary (Signed)
Physician Discharge Summary    Providing Compassionate, Quality Care - Together   Patient ID: Veronica Harmon MRN: [id number] DOB/AGE: 29-Jul-1990 28 y.o.  Admit date: 06/28/2018 Discharge date: 06/28/2018  Admission Diagnoses: Cervical herniated disc  Discharge Diagnoses:  Active Problems:   Cervical herniated disc   Discharged Condition: good  Hospital Course: Veronica Harmon presented to [facility] on 06/28/2018 for C5-6 anterior cervical discectomy/decompression; C5-6 disc arthroplasty with Zimmer artificial disc. She did well postoperatively and is ready to discharge home.  Consults: None  Significant Diagnostic Studies: Dg Cervical Spine 2-3 Views  Result Date: 06/28/2018 CLINICAL DATA:  C5-6 artificial disc replacement. EXAM: DG C-ARM 61-120 MIN; CERVICAL SPINE - 2-3 VIEW COMPARISON:  None. FINDINGS: Two intraoperative fluoroscopic images of the cervical spine are provided. Described placement hardware is placed at the C5-6 level. Fluoroscopy was provided for 14 seconds. IMPRESSION: Intraoperative fluoroscopic images during C5-6 disc replacement. Electronically Signed   By: Bary Richard M.D.   On: 06/28/2018 13:54   Dg C-arm 1-60 Min  Result Date: 06/28/2018 CLINICAL DATA:  C5-6 artificial disc replacement. EXAM: DG C-ARM 61-120 MIN; CERVICAL SPINE - 2-3 VIEW COMPARISON:  None. FINDINGS: Two intraoperative fluoroscopic images of the cervical spine are provided. Described placement hardware is placed at the C5-6 level. Fluoroscopy was provided for 14 seconds. IMPRESSION: Intraoperative fluoroscopic images during C5-6 disc replacement. Electronically Signed   By: Bary Richard M.D.   On: 06/28/2018 13:54     Treatments: C5-6 anterior cervical discectomy/decompression; C5-6 disc arthroplasty with Zimmer artificial disc  Discharge Exam: Blood pressure 119/77, pulse (!) 102, temperature 98 F (36.7 C), temperature source Oral, resp. rate 16, height 5\' 6"  (1.676 m), weight 79.4 kg,  SpO2 98 %.   Alert and oriented x 4 MAE Dressing is clean, dry, and intact Patient has been educated on donning her soft collar  Disposition: Discharge disposition: 01-Home or Self Care        Allergies as of 06/28/2018      Reactions   Allyl Isothiocyanate Hives   Sulfa Antibiotics Other (See Comments)   UNKNOWN    Vicodin [hydrocodone-acetaminophen] Itching, Nausea And Vomiting, Other (See Comments)   feels like something crawling.      Medication List    TAKE these medications   albuterol 108 (90 Base) MCG/ACT inhaler Commonly known as:  PROVENTIL HFA;VENTOLIN HFA Inhale 2 puffs into the lungs every 4 (four) hours as needed for wheezing or shortness of breath.   ALPRAZolam 1 MG tablet Commonly known as:  XANAX Take 0.5 tablets (0.5 mg total) by mouth 3 (three) times daily as needed for anxiety.   Aspirin-Acetaminophen-Caffeine 500-325-65 MG Pack Take 2 tablets by mouth every 6 (six) hours as needed for headache or migraine.   buPROPion 150 MG 24 hr tablet Commonly known as:  WELLBUTRIN XL Take 1 tablet (150 mg total) by mouth daily.   cyclobenzaprine 10 MG tablet Commonly known as:  FLEXERIL Take 1 tablet (10 mg total) by mouth 3 (three) times daily as needed for muscle spasms.   ketorolac 10 MG tablet Commonly known as:  TORADOL Take 1 tablet (10 mg total) by mouth every 6 (six) hours as needed.   metoCLOPramide 10 MG tablet Commonly known as:  REGLAN Take 1 tablet (10 mg total) by mouth 3 (three) times daily with meals.   multivitamin with minerals Tabs tablet Take 1 tablet by mouth daily.   NEXPLANON 68 MG Impl implant Generic drug:  etonogestrel 68 mg by  Subdermal route once.   Oxycodone HCl 10 MG Tabs Take 1 tablet (10 mg total) by mouth every 4 (four) hours as needed for severe pain ((score 7 to 10)).   traMADol 50 MG tablet Commonly known as:  ULTRAM Take 50 mg by mouth every morning.      Follow-up Information    Tressie Stalker, MD  Follow up.   Specialty:  Neurosurgery Why:  Call to schedule a follow up appointment if one is not already scheduled. Contact information: [address] [place] [address] [phone]           Signed: Val Eagle, DNP, AGNP-C Nurse Practitioner  [facility] [address], [address], [place], [place] [address] P: [phone]    F: [phone]  06/28/2018, 6:56 PM

## 2018-06-28 NOTE — Progress Notes (Signed)
Subjective: The patient is somnolent but will easily arousable.  She looks well.  She is in no apparent distress.  Objective: Vital signs in last 24 hours: Temp:  [97.7 F (36.5 C)-98.2 F (36.8 C)] 97.7 F (36.5 C) (02/27 1245) Pulse Rate:  [88-100] 88 (02/27 1330) Resp:  [12-18] 16 (02/27 1330) BP: (114-127)/(73-89) 117/83 (02/27 1330) SpO2:  [92 %-100 %] 94 % (02/27 1330) Weight:  [79.4 kg] 79.4 kg (02/27 0753) Estimated body mass index is 28.25 kg/m as calculated from the following:   Height as of this encounter: 5\' 6"  (1.676 m).   Weight as of this encounter: 79.4 kg.   Intake/Output from previous day: No intake/output data recorded. Intake/Output this shift: Total I/O In: 1500 [I.V.:1500] Out: 50 [Blood:50]  Physical exam the patient is somnolent but easily arousable.  Her dressing is clean and dry.  There is no hematoma or shift.  She is moving all 4 extremities well.  Lab Results: No results for input(s): WBC, HGB, HCT, PLT in the last 72 hours. BMET No results for input(s): NA, K, CL, CO2, GLUCOSE, BUN, CREATININE, CALCIUM in the last 72 hours.  Studies/Results: No results found.  Assessment/Plan: The patient is doing well.  I spoke with her family.  LOS: 0 days     Cristi Loron 06/28/2018, 1:34 PM

## 2018-06-28 NOTE — Anesthesia Preprocedure Evaluation (Addendum)
Anesthesia Evaluation  Patient identified by MRN, date of birth, ID band Patient awake    Reviewed: Allergy & Precautions, NPO status , Patient's Chart, lab work & pertinent test results  Airway Mallampati: I  TM Distance: >3 FB Neck ROM: Full    Dental  (+) Teeth Intact, Dental Advisory Given   Pulmonary Current Smoker,    breath sounds clear to auscultation       Cardiovascular negative cardio ROS   Rhythm:Regular Rate:Normal     Neuro/Psych Anxiety Depression    GI/Hepatic negative GI ROS, Neg liver ROS,   Endo/Other  negative endocrine ROS  Renal/GU negative Renal ROS     Musculoskeletal negative musculoskeletal ROS (+)   Abdominal Normal abdominal exam  (+)   Peds  Hematology negative hematology ROS (+)   Anesthesia Other Findings Neck pain with extension  Reproductive/Obstetrics                            Lab Results  Component Value Date   WBC 9.2 06/21/2018   HGB 14.9 06/21/2018   HCT 44.9 06/21/2018   MCV 91.4 06/21/2018   PLT 334 06/21/2018   Lab Results  Component Value Date   CREATININE 0.71 06/21/2018   BUN 6 06/21/2018   NA 136 06/21/2018   K 3.9 06/21/2018   CL 105 06/21/2018   CO2 21 (L) 06/21/2018   No results found for: INR, PROTIME  EKG: normal sinus rhythm.  Anesthesia Physical Anesthesia Plan  ASA: II  Anesthesia Plan: General   Post-op Pain Management:    Induction: Intravenous  PONV Risk Score and Plan: 3 and Ondansetron, Dexamethasone and Midazolam  Airway Management Planned: Oral ETT and Video Laryngoscope Planned  Additional Equipment: None  Intra-op Plan:   Post-operative Plan: Extubation in OR  Informed Consent: I have reviewed the patients History and Physical, chart, labs and discussed the procedure including the risks, benefits and alternatives for the proposed anesthesia with the patient or authorized representative who has  indicated his/her understanding and acceptance.     Dental advisory given  Plan Discussed with: CRNA  Anesthesia Plan Comments:        Anesthesia Quick Evaluation

## 2018-06-28 NOTE — Anesthesia Postprocedure Evaluation (Signed)
Anesthesia Post Note  Patient: ELYSHIA PARISE  Procedure(s) Performed: Cervical five-six Artificial disc replacement (N/A )     Patient location during evaluation: PACU Anesthesia Type: General Level of consciousness: awake and alert Pain management: pain level controlled Vital Signs Assessment: post-procedure vital signs reviewed and stable Respiratory status: spontaneous breathing, nonlabored ventilation, respiratory function stable and patient connected to nasal cannula oxygen Cardiovascular status: blood pressure returned to baseline and stable Postop Assessment: no apparent nausea or vomiting Anesthetic complications: no    Last Vitals:  Vitals:   06/28/18 1321 06/28/18 1330  BP:  117/83  Pulse: 89 88  Resp: 13 16  Temp:  (!) 36.1 C  SpO2: 93% 94%    Last Pain:  Vitals:   06/28/18 1400  TempSrc:   PainSc: 5       LLE Sensation: (P) Full sensation (06/28/18 1400) RLE Motor Response: (P) Purposeful movement (06/28/18 1400) RLE Sensation: (P) Full sensation (06/28/18 1400)      Shelton Silvas

## 2018-06-28 NOTE — H&P (Signed)
Subjective: The patient is a 28 year old white female who has complained of neck and left arm pain consistent with a cervical radiculopathy.  She has failed medical management and was worked up with a cervical MRI.  This demonstrated a herniated disc at C5-6.  I discussed the various treatment options with the patient.  She has decided to proceed with surgery.  Past Medical History:  Diagnosis Date  . Anxiety   . Chlamydia infection   . Chronic neck pain   . Depression     Past Surgical History:  Procedure Laterality Date  . WISDOM TOOTH EXTRACTION      Allergies  Allergen Reactions  . Allyl Isothiocyanate Hives  . Sulfa Antibiotics Other (See Comments)    UNKNOWN   . Vicodin [Hydrocodone-Acetaminophen] Itching, Nausea And Vomiting and Other (See Comments)    feels like something crawling.    Social History   Tobacco Use  . Smoking status: Current Every Day Smoker    Packs/day: 0.50    Types: Cigarettes  . Smokeless tobacco: Never Used  Substance Use Topics  . Alcohol use: Not Currently    Comment: occas    Family History  Problem Relation Age of Onset  . Cancer Paternal Aunt        breast  . Healthy Mother   . Healthy Father    Prior to Admission medications   Medication Sig Start Date End Date Taking? Authorizing Provider  Aspirin-Acetaminophen-Caffeine [phone] MG PACK Take 2 tablets by mouth every 6 (six) hours as needed for headache or migraine.    Yes [provider]  etonogestrel (NEXPLANON) 68 MG IMPL implant 68 mg by Subdermal route once.    Yes [provider]  Multiple Vitamin (MULTIVITAMIN WITH MINERALS) TABS tablet Take 1 tablet by mouth daily.   Yes [provider]  traMADol (ULTRAM) 50 MG tablet Take 50 mg by mouth every morning.   Yes [provider]  albuterol (PROVENTIL HFA;VENTOLIN HFA) 108 (90 Base) MCG/ACT inhaler Inhale 2 puffs into the lungs every 4 (four) hours as needed for wheezing or shortness of  breath. Patient not taking: Reported on 04/18/2018 10/26/17   Dionne Bucy, MD  ALPRAZolam Prudy Feeler) 1 MG tablet Take 0.5 tablets (0.5 mg total) by mouth 3 (three) times daily as needed for anxiety. 05/31/18   Shambley, Melody N, CNM  buPROPion (WELLBUTRIN XL) 150 MG 24 hr tablet Take 1 tablet (150 mg total) by mouth daily. Patient not taking: Reported on 04/18/2018 03/02/18   Purcell Nails, CNM  ketorolac (TORADOL) 10 MG tablet Take 1 tablet (10 mg total) by mouth every 6 (six) hours as needed. Patient not taking: Reported on 06/12/2018 05/10/18   Kem Boroughs B, FNP  metoCLOPramide (REGLAN) 10 MG tablet Take 1 tablet (10 mg total) by mouth 3 (three) times daily with meals. 05/10/18 06/09/18  Kem Boroughs B, FNP     Review of Systems  Positive ROS: As above  All other systems have been reviewed and were otherwise negative with the exception of those mentioned in the HPI and as above.  Objective: Vital signs in last 24 hours: Temp:  [98.2 F (36.8 C)] 98.2 F (36.8 C) (02/27 0753) Pulse Rate:  [90] 90 (02/27 0753) Resp:  [18] 18 (02/27 0753) BP: (117)/(73) 117/73 (02/27 0753) SpO2:  [100 %] 100 % (02/27 0753) Weight:  [79.4 kg] 79.4 kg (02/27 0753) Estimated body mass index is 28.25 kg/m as calculated from the following:   Height as of  this encounter: 5\' 6"  (1.676 m).   Weight as of this encounter: 79.4 kg.   General Appearance: Alert Head: Normocephalic, without obvious abnormality, atraumatic Eyes: PERRL, conjunctiva/corneas clear, EOM's intact,    Ears: Normal  Throat: Normal  Neck: Positive Spurling's testing on the left. Back: unremarkable Lungs: Clear to auscultation bilaterally, respirations unlabored Heart: Regular rate and rhythm, no murmur, rub or gallop Abdomen: Soft, non-tender Extremities: Extremities normal, atraumatic, no cyanosis or edema Skin: unremarkable  NEUROLOGIC:   Mental status: alert and oriented,Motor Exam - grossly normal Sensory Exam  -left C6 numbness Reflexes:  Coordination - grossly normal Gait - grossly normal Balance - grossly normal Cranial Nerves: I: smell Not tested  II: visual acuity  OS: Normal  OD: Normal   II: visual fields Full to confrontation  II: pupils Equal, round, reactive to light  III,VII: ptosis None  III,IV,VI: extraocular muscles  Full ROM  V: mastication Normal  V: facial light touch sensation  Normal  V,VII: corneal reflex  Present  VII: facial muscle function - upper  Normal  VII: facial muscle function - lower Normal  VIII: hearing Not tested  IX: soft palate elevation  Normal  IX,X: gag reflex Present  XI: trapezius strength  5/5  XI: sternocleidomastoid strength 5/5  XI: neck flexion strength  5/5  XII: tongue strength  Normal    Data Review Lab Results  Component Value Date   WBC 9.2 06/21/2018   HGB 14.9 06/21/2018   HCT 44.9 06/21/2018   MCV 91.4 06/21/2018   PLT 334 06/21/2018   Lab Results  Component Value Date   NA 136 06/21/2018   K 3.9 06/21/2018   CL 105 06/21/2018   CO2 21 (L) 06/21/2018   BUN 6 06/21/2018   CREATININE 0.71 06/21/2018   GLUCOSE 92 06/21/2018   No results found for: INR, PROTIME  Assessment/Plan: C5-6 herniated disc, cervicalgia, cervical radiculopathy: I have discussed the situation with the patient.  I have reviewed her imaging studies with her and pointed out the abnormalities.  We have discussed the various treatment options including surgery.  I have described the surgical treatment option of a C5-6 anterior cervical discectomy with this arthroplasty.  I have shown her surgical models.  I have given her a surgical pamphlet.  We have discussed the risks, benefits, alternatives, expected postoperative course, and likelihood of achieving our goals with surgery.  I have answered all her questions.  She has decided to proceed with surgery.   Cristi Loron 06/28/2018 10:27 AM

## 2018-06-29 ENCOUNTER — Encounter (HOSPITAL_COMMUNITY): Payer: Self-pay | Admitting: Neurosurgery

## 2018-07-12 ENCOUNTER — Ambulatory Visit (INDEPENDENT_AMBULATORY_CARE_PROVIDER_SITE_OTHER): Payer: Medicaid Other | Admitting: Obstetrics and Gynecology

## 2018-07-12 ENCOUNTER — Encounter: Payer: Self-pay | Admitting: Obstetrics and Gynecology

## 2018-07-12 ENCOUNTER — Other Ambulatory Visit: Payer: Self-pay

## 2018-07-12 ENCOUNTER — Other Ambulatory Visit (HOSPITAL_COMMUNITY)
Admission: RE | Admit: 2018-07-12 | Discharge: 2018-07-12 | Disposition: A | Discharge status: Home / Self Care | Payer: Medicaid Other | Source: Ambulatory Visit | Attending: Obstetrics and Gynecology | Admitting: Obstetrics and Gynecology

## 2018-07-12 VITALS — BP 117/74 | HR 110 | Ht 66.0 in | Wt 174.4 lb

## 2018-07-12 DIAGNOSIS — Z01419 Encounter for gynecological examination (general) (routine) without abnormal findings: Secondary | ICD-10-CM | POA: Insufficient documentation

## 2018-07-12 DIAGNOSIS — F419 Anxiety disorder, unspecified: Secondary | ICD-10-CM

## 2018-07-12 DIAGNOSIS — Z6828 Body mass index (BMI) 28.0-28.9, adult: Secondary | ICD-10-CM | POA: Diagnosis not present

## 2018-07-12 DIAGNOSIS — B9689 Other specified bacterial agents as the cause of diseases classified elsewhere: Secondary | ICD-10-CM

## 2018-07-12 DIAGNOSIS — N760 Acute vaginitis: Secondary | ICD-10-CM | POA: Diagnosis not present

## 2018-07-12 MED ORDER — FLUOXETINE HCL 20 MG PO CAPS: 20.0000 mg | ORAL_CAPSULE | Freq: Every day | ORAL | 3 refills | Status: DC

## 2018-07-12 MED ORDER — METRONIDAZOLE 0.75 % VA GEL: 1.0000 | Freq: Every day | VAGINAL | 1 refills | Status: DC

## 2018-07-12 NOTE — Progress Notes (Signed)
Subjective:   Veronica Harmon is a 28 y.o. G57P0 Caucasian female here for a routine well-woman exam.  No LMP recorded. Patient has had an implant.    Current complaints: increased vaginal discharge with odor at times. H/o BV. Also desires restarting prozac as anxiety has been increased with recent neck surgery. GAD 7 : Generalized Anxiety Score 07/12/2018  Nervous, Anxious, on Edge 1  Control/stop worrying 1  Worry too much - different things 1  Trouble relaxing 1  Restless 0  Easily annoyed or irritable 1  Afraid - awful might happen 1  Total GAD 7 Score 6  Anxiety Difficulty Somewhat difficult    PCP: me       doesn't desire labs  Social History: Sexual: heterosexual Marital Status: single Living situation: with children Occupation: unknown occupation Tobacco/alcohol: no tobacco use Illicit drugs: no history of illicit drug use  The following portions of the patient's history were reviewed and updated as appropriate: allergies, current medications, past family history, past medical history, past social history, past surgical history and problem list.  Past Medical History Past Medical History:  Diagnosis Date  . Anxiety   . Chlamydia infection   . Chronic neck pain   . Depression     Past Surgical History Past Surgical History:  Procedure Laterality Date  . CERVICAL DISC ARTHROPLASTY N/A 06/28/2018   Procedure: Cervical five-six Artificial disc replacement;  Surgeon: Tressie Stalker, MD;  [facility];  Service: Neurosurgery;  Laterality: N/A;  Cervical five-six Artificial disc replacement  . WISDOM TOOTH EXTRACTION      Gynecologic History G2P0  No LMP recorded. Patient has had an implant. Contraception: abstinence Last Pap: 11/19. Results were: normal(request repeat)   Obstetric History OB History  Gravida Para Term Preterm AB Living  2            SAB TAB Ectopic Multiple Live Births               # Outcome Date GA Lbr Len/2nd Weight Sex Delivery  Anes PTL Lv  2 Gravida 2014    F Vag-Spont     1 Gravida 2008    M Vag-Spont       Current Medications Current Outpatient Medications on File Prior to Visit  Medication Sig Dispense Refill  . ALPRAZolam (XANAX) 1 MG tablet Take 0.5 tablets (0.5 mg total) by mouth 3 (three) times daily as needed for anxiety. 60 tablet 2  . Aspirin-Acetaminophen-Caffeine 500-325-65 MG PACK Take 2 tablets by mouth every 6 (six) hours as needed for headache or migraine.     . cyclobenzaprine (FLEXERIL) 10 MG tablet Take 1 tablet (10 mg total) by mouth 3 (three) times daily as needed for muscle spasms. 30 tablet 0  . etonogestrel (NEXPLANON) 68 MG IMPL implant 68 mg by Subdermal route once.     . Multiple Vitamin (MULTIVITAMIN WITH MINERALS) TABS tablet Take 1 tablet by mouth daily.    Marland Kitchen oxyCODONE 10 MG TABS Take 1 tablet (10 mg total) by mouth every 4 (four) hours as needed for severe pain ((score 7 to 10)). 42 tablet 0  . traMADol (ULTRAM) 50 MG tablet Take 50 mg by mouth every morning.    Marland Kitchen albuterol (PROVENTIL HFA;VENTOLIN HFA) 108 (90 Base) MCG/ACT inhaler Inhale 2 puffs into the lungs every 4 (four) hours as needed for wheezing or shortness of breath. (Patient not taking: Reported on 04/18/2018) 1 Inhaler 0  . metoCLOPramide (REGLAN) 10 MG tablet Take 1 tablet (10  mg total) by mouth 3 (three) times daily with meals. 90 tablet 0   No current facility-administered medications on file prior to visit.     Review of Systems Patient denies any headaches, blurred vision, shortness of breath, chest pain, abdominal pain, problems with bowel movements, urination, or intercourse.  Objective:  BP 117/74   Pulse (!) 110   Ht 5\' 6"  (1.676 m)   Wt 174 lb 6.4 oz (79.1 kg)   BMI 28.15 kg/m  Physical Exam  General:  Well developed, well nourished, no acute distress. She is alert and oriented x3. Skin:  Warm and dry Neck:  Midline trachea, no thyromegaly or nodules Cardiovascular: Regular rate and rhythm, no murmur  heard Lungs:  Effort normal, all lung fields clear to auscultation bilaterally Breasts:  No dominant palpable mass, retraction, or nipple discharge Abdomen:  Soft, non tender, no hepatosplenomegaly or masses Pelvic:  External genitalia is normal in appearance.  The vagina is normal in appearance. The cervix is bulbous, no CMT.  Thin prep pap is done with HR HPV cotesting. Uterus is felt to be normal size, shape, and contour.  No adnexal masses or tenderness noted. Increased amount white discharge noted at cervix. Microscopic wet-mount exam shows clue cells, white blood cells. Extremities:  No swelling or varicosities noted Psych:  She has a normal mood and affect  Assessment:   Healthy well-woman exam Anxiety BV BMI 28  Plan:  Will resume prozac at 10mg  for 2 weeks then increase to 20 mg. Discussed possible interaction with tramadol and patient will watch for s/s, but is weaning down on the tramadol as postop time goes on and she doesn't need it as much. metrogel prescribed and instructed on use.  F/U 1 year for AE, or sooner if needed   Melody Suzan Nailer, CNM

## 2018-07-12 NOTE — Patient Instructions (Signed)
 Preventive Care 18-39 Years, Female Preventive care refers to lifestyle choices and visits with your health care provider that can promote health and wellness. What does preventive care include?   A yearly physical exam. This is also called an annual well check.  Dental exams once or twice a year.  Routine eye exams. Ask your health care provider how often you should have your eyes checked.  Personal lifestyle choices, including: ? Daily care of your teeth and gums. ? Regular physical activity. ? Eating a healthy diet. ? Avoiding tobacco and drug use. ? Limiting alcohol use. ? Practicing safe sex. ? Taking vitamin and mineral supplements as recommended by your health care provider. What happens during an annual well check? The services and screenings done by your health care provider during your annual well check will depend on your age, overall health, lifestyle risk factors, and family history of disease. Counseling Your health care provider may ask you questions about your:  Alcohol use.  Tobacco use.  Drug use.  Emotional well-being.  Home and relationship well-being.  Sexual activity.  Eating habits.  Work and work environment.  Method of birth control.  Menstrual cycle.  Pregnancy history. Screening You may have the following tests or measurements:  Height, weight, and BMI.  Diabetes screening. This is done by checking your blood sugar (glucose) after you have not eaten for a while (fasting).  Blood pressure.  Lipid and cholesterol levels. These may be checked every 5 years starting at age 20.  Skin check.  Hepatitis C blood test.  Hepatitis B blood test.  Sexually transmitted disease (STD) testing.  BRCA-related cancer screening. This may be done if you have a family history of breast, ovarian, tubal, or peritoneal cancers.  Pelvic exam and Pap test. This may be done every 3 years starting at age 21. Starting at age 30, this may be done  every 5 years if you have a Pap test in combination with an HPV test. Discuss your test results, treatment options, and if necessary, the need for more tests with your health care provider. Vaccines Your health care provider may recommend certain vaccines, such as:  Influenza vaccine. This is recommended every year.  Tetanus, diphtheria, and acellular pertussis (Tdap, Td) vaccine. You may need a Td booster every 10 years.  Varicella vaccine. You may need this if you have not been vaccinated.  HPV vaccine. If you are 26 or younger, you may need three doses over 6 months.  Measles, mumps, and rubella (MMR) vaccine. You may need at least one dose of MMR. You may also need a second dose.  Pneumococcal 13-valent conjugate (PCV13) vaccine. You may need this if you have certain conditions and were not previously vaccinated.  Pneumococcal polysaccharide (PPSV23) vaccine. You may need one or two doses if you smoke cigarettes or if you have certain conditions.  Meningococcal vaccine. One dose is recommended if you are age 19-21 years and a first-year college student living in a residence hall, or if you have one of several medical conditions. You may also need additional booster doses.  Hepatitis A vaccine. You may need this if you have certain conditions or if you travel or work in places where you may be exposed to hepatitis A.  Hepatitis B vaccine. You may need this if you have certain conditions or if you travel or work in places where you may be exposed to hepatitis B.  Haemophilus influenzae type b (Hib) vaccine. You may need this if   you have certain risk factors. Talk to your health care provider about which screenings and vaccines you need and how often you need them. This information is not intended to replace advice given to you by your health care provider. Make sure you discuss any questions you have with your health care provider. Document Released: 06/14/2001 Document Revised:  11/29/2016 Document Reviewed: 02/17/2015 Elsevier Interactive Patient Education  2019 Elsevier Inc.  

## 2018-07-18 LAB — CYTOLOGY - PAP
Adequacy: ABSENT
Chlamydia: NEGATIVE
Diagnosis: NEGATIVE
HPV: NOT DETECTED
Neisseria Gonorrhea: NEGATIVE

## 2018-10-23 ENCOUNTER — Telehealth: Payer: Self-pay | Admitting: Obstetrics and Gynecology

## 2018-10-23 ENCOUNTER — Other Ambulatory Visit: Payer: Self-pay | Admitting: *Deleted

## 2018-10-23 MED ORDER — KETOROLAC TROMETHAMINE 10 MG PO TABS: 10.0000 mg | ORAL_TABLET | Freq: Four times a day (QID) | ORAL | 0 refills | Status: DC | PRN

## 2018-10-23 NOTE — Telephone Encounter (Signed)
Done-ac 

## 2018-10-23 NOTE — Telephone Encounter (Signed)
Patient called stating she woke up with a severe migraine, she would like something called in if possible to [facility] in [place]. She stated she has been vomiting all day. Thanks

## 2018-12-13 ENCOUNTER — Other Ambulatory Visit: Payer: Self-pay

## 2018-12-13 DIAGNOSIS — Z20822 Contact with and (suspected) exposure to covid-19: Secondary | ICD-10-CM

## 2018-12-14 LAB — NOVEL CORONAVIRUS, NAA: SARS-CoV-2, NAA: NOT DETECTED

## 2018-12-18 ENCOUNTER — Other Ambulatory Visit: Payer: Self-pay

## 2018-12-18 DIAGNOSIS — Z20822 Contact with and (suspected) exposure to covid-19: Secondary | ICD-10-CM

## 2018-12-19 ENCOUNTER — Other Ambulatory Visit: Payer: Self-pay | Admitting: *Deleted

## 2018-12-19 LAB — NOVEL CORONAVIRUS, NAA: SARS-CoV-2, NAA: NOT DETECTED

## 2018-12-19 MED ORDER — ALPRAZOLAM 1 MG PO TABS: 0.5000 mg | ORAL_TABLET | Freq: Three times a day (TID) | ORAL | 2 refills | Status: DC | PRN

## 2019-01-03 ENCOUNTER — Other Ambulatory Visit: Payer: Self-pay

## 2019-01-03 ENCOUNTER — Ambulatory Visit: Payer: Medicaid Other

## 2019-01-03 DIAGNOSIS — R300 Dysuria: Secondary | ICD-10-CM

## 2019-01-03 LAB — POCT URINALYSIS DIPSTICK
Bilirubin, UA: NEGATIVE
Blood, UA: NEGATIVE
Glucose, UA: NEGATIVE
Leukocytes, UA: NEGATIVE
Nitrite, UA: NEGATIVE
Protein, UA: NEGATIVE
Spec Grav, UA: 1.015 (ref 1.010–1.025)
Urobilinogen, UA: 0.2 E.U./dL
pH, UA: 6.5 (ref 5.0–8.0)

## 2019-01-05 LAB — URINE CULTURE

## 2019-01-22 ENCOUNTER — Encounter: Payer: Self-pay | Admitting: Certified Nurse Midwife

## 2019-01-22 ENCOUNTER — Other Ambulatory Visit (HOSPITAL_COMMUNITY)
Admission: RE | Admit: 2019-01-22 | Discharge: 2019-01-22 | Disposition: A | Discharge status: Home / Self Care | Payer: Medicaid Other | Source: Ambulatory Visit | Attending: Certified Nurse Midwife | Admitting: Certified Nurse Midwife

## 2019-01-22 ENCOUNTER — Other Ambulatory Visit: Payer: Self-pay

## 2019-01-22 ENCOUNTER — Ambulatory Visit (INDEPENDENT_AMBULATORY_CARE_PROVIDER_SITE_OTHER): Payer: Medicaid Other | Admitting: Certified Nurse Midwife

## 2019-01-22 VITALS — BP 109/73 | HR 88 | Ht 66.0 in | Wt 167.9 lb

## 2019-01-22 DIAGNOSIS — Z975 Presence of (intrauterine) contraceptive device: Secondary | ICD-10-CM

## 2019-01-22 DIAGNOSIS — N921 Excessive and frequent menstruation with irregular cycle: Secondary | ICD-10-CM | POA: Insufficient documentation

## 2019-01-22 DIAGNOSIS — Z87898 Personal history of other specified conditions: Secondary | ICD-10-CM

## 2019-01-22 DIAGNOSIS — N939 Abnormal uterine and vaginal bleeding, unspecified: Secondary | ICD-10-CM | POA: Diagnosis not present

## 2019-01-22 DIAGNOSIS — Z8742 Personal history of other diseases of the female genital tract: Secondary | ICD-10-CM | POA: Diagnosis not present

## 2019-01-22 DIAGNOSIS — Z8489 Family history of other specified conditions: Secondary | ICD-10-CM | POA: Insufficient documentation

## 2019-01-22 DIAGNOSIS — L709 Acne, unspecified: Secondary | ICD-10-CM

## 2019-01-22 NOTE — Progress Notes (Signed)
Patient c/o dull pain and severe abdominal cramping that started 5 days ago "it felt so heavy down there", passed multiple clots 3 days ago and had heavy vaginal bleeding that has slowed down since last night.

## 2019-01-22 NOTE — Patient Instructions (Signed)
Uterine Fibroids  Uterine fibroids are lumps of tissue (tumors) in your womb (uterus). They are not cancer (are benign). Most women with this condition do not need treatment. Sometimes fibroids can affect your ability to have children (your fertility). If that happens, you may need surgery to take out the fibroids. Follow these instructions at home:  Take over-the-counter and prescription medicines only as told by your doctor. Your doctor may suggest NSAIDs (such as aspirin or ibuprofen) to help with pain.  Ask your doctor if you should: ? Take iron pills. ? Eat more foods that have iron in them, such as dark green, leafy vegetables.  If directed, apply heat to your back or belly to reduce pain. Use the heat source that your doctor recommends, such as a moist heat pack or a heating pad. ? Put a towel between your skin and the heat source. ? Leave the heat on for 20-30 minutes. ? Remove the heat if your skin turns bright red. This is especially important if you are unable to feel pain, heat, or cold. You may have a greater risk of getting burned.  Pay close attention to your period (menstrual) cycles. Tell your doctor about any changes, such as: ? A heavier blood flow than usual. ? Needing to use more pads or tampons than normal. ? A change in how many days your period lasts. ? A change in symptoms that come with your period, such as cramps or back pain.  Keep all follow-up visits as told by your doctor. This is important. Your doctor may need to watch your fibroids over time for any changes. Contact a doctor if you:  Have pain that does not get better with medicine or heat, such as pain or cramps in: ? Your back. ? The area between your hip bones (pelvic area). ? Your belly.  Have new bleeding between your periods.  Have more bleeding during or between your periods.  Feel very tired or weak.  Feel light-headed. Get help right away if you:  Pass out (faint).  Have pain in the  area between your hip bones that suddenly gets worse.  Have bleeding that soaks a tampon or pad in 30 minutes or less. Summary  Uterine fibroids are lumps of tissue (tumors) in your womb (uterus). They are not cancer.  The only treatment that most women need is taking aspirin or ibuprofen for pain.  Contact a doctor if you have pain or cramps that do not get better with medicine.  Make sure you know what symptoms you should get help for right away. This information is not intended to replace advice given to you by your health care provider. Make sure you discuss any questions you have with your health care provider. Document Released: 05/21/2010 Document Revised: 03/31/2017 Document Reviewed: 03/14/2017 Elsevier Patient Education  2020 Elsevier Inc. Abnormal Uterine Bleeding Abnormal uterine bleeding means bleeding more than usual from your uterus. It can include:  Bleeding between periods.  Bleeding after sex.  Bleeding that is heavier than normal.  Periods that last longer than usual.  Bleeding after you have stopped having your period (menopause). There are many problems that may cause this. You should see a doctor for any kind of bleeding that is not normal. Treatment depends on the cause of the bleeding. Follow these instructions at home:  Watch your condition for any changes.  Do not use tampons, douche, or have sex, if your doctor tells you not to.  Change your pads often.  Get regular well-woman exams. Make sure they include a pelvic exam and cervical cancer screening.  Keep all follow-up visits as told by your doctor. This is important. Contact a doctor if:  The bleeding lasts more than one week.  You feel dizzy at times.  You feel like you are going to throw up (nauseous).  You throw up. Get help right away if:  You pass out.  You have to change pads every hour.  You have belly (abdominal) pain.  You have a fever.  You get sweaty.  You get  weak.  You passing large blood clots from your vagina. Summary  Abnormal uterine bleeding means bleeding more than usual from your uterus.  There are many problems that may cause this. You should see a doctor for any kind of bleeding that is not normal.  Treatment depends on the cause of the bleeding. This information is not intended to replace advice given to you by your health care provider. Make sure you discuss any questions you have with your health care provider. Document Released: 02/13/2009 Document Revised: 04/12/2016 Document Reviewed: 04/12/2016 Elsevier Patient Education  2020 [place] is this medicine? ETONOGESTREL (et oh noe JES trel) is a contraceptive (birth control) device. It is used to prevent pregnancy. It can be used for up to 3 years. This medicine may be used for other purposes; ask your health care provider or pharmacist if you have questions. COMMON BRAND NAME(S): Implanon, Nexplanon What should I tell my health care provider before I take this medicine? They need to know if you have any of these conditions:  abnormal vaginal bleeding  blood vessel disease or blood clots  breast, cervical, endometrial, ovarian, liver, or uterine cancer  diabetes  gallbladder disease  heart disease or recent heart attack  high blood pressure  high cholesterol or triglycerides  kidney disease  liver disease  migraine headaches  seizures  stroke  tobacco smoker  an unusual or allergic reaction to etonogestrel, anesthetics or antiseptics, other medicines, foods, dyes, or preservatives  pregnant or trying to get pregnant  breast-feeding How should I use this medicine? This device is inserted just under the skin on the inner side of your upper arm by a health care professional. Talk to your pediatrician regarding the use of this medicine in children. Special care may be needed. Overdosage: If you think you have taken too much  of this medicine contact a poison control center or emergency room at once. NOTE: This medicine is only for you. Do not share this medicine with others. What if I miss a dose? This does not apply. What may interact with this medicine? Do not take this medicine with any of the following medications:  amprenavir  fosamprenavir This medicine may also interact with the following medications:  acitretin  aprepitant  armodafinil  bexarotene  bosentan  carbamazepine  certain medicines for fungal infections like fluconazole, ketoconazole, itraconazole and voriconazole  certain medicines to treat hepatitis, HIV or AIDS  cyclosporine  felbamate  griseofulvin  lamotrigine  modafinil  oxcarbazepine  phenobarbital  phenytoin  primidone  rifabutin  rifampin  rifapentine  St. John's wort  topiramate This list may not describe all possible interactions. Give your health care provider a list of all the medicines, herbs, non-prescription drugs, or dietary supplements you use. Also tell them if you smoke, drink alcohol, or use illegal drugs. Some items may interact with your medicine. What should I watch for while using this medicine?  This product does not protect you against HIV infection (AIDS) or other sexually transmitted diseases. You should be able to feel the implant by pressing your fingertips over the skin where it was inserted. Contact your doctor if you cannot feel the implant, and use a non-hormonal birth control method (such as condoms) until your doctor confirms that the implant is in place. Contact your doctor if you think that the implant may have broken or become bent while in your arm. You will receive a user card from your health care provider after the implant is inserted. The card is a record of the location of the implant in your upper arm and when it should be removed. Keep this card with your health records. What side effects may I notice from  receiving this medicine? Side effects that you should report to your doctor or health care professional as soon as possible:  allergic reactions like skin rash, itching or hives, swelling of the face, lips, or tongue  breast lumps, breast tissue changes, or discharge  breathing problems  changes in emotions or moods  if you feel that the implant may have broken or bent while in your arm  high blood pressure  pain, irritation, swelling, or bruising at the insertion site  scar at site of insertion  signs of infection at the insertion site such as fever, and skin redness, pain or discharge  signs and symptoms of a blood clot such as breathing problems; changes in vision; chest pain; severe, sudden headache; pain, swelling, warmth in the leg; trouble speaking; sudden numbness or weakness of the face, arm or leg  signs and symptoms of liver injury like dark yellow or brown urine; general ill feeling or flu-like symptoms; light-colored stools; loss of appetite; nausea; right upper belly pain; unusually weak or tired; yellowing of the eyes or skin  unusual vaginal bleeding, discharge Side effects that usually do not require medical attention (report to your doctor or health care professional if they continue or are bothersome):  acne  breast pain or tenderness  headache  irregular menstrual bleeding  nausea This list may not describe all possible side effects. Call your doctor for medical advice about side effects. You may report side effects to FDA at 1-800-FDA-1088. Where should I keep my medicine? This drug is given in a hospital or clinic and will not be stored at home. NOTE: This sheet is a summary. It may not cover all possible information. If you have questions about this medicine, talk to your doctor, pharmacist, or health care provider.  2020 Elsevier/Gold Standard (2017-03-07 14:11:42)

## 2019-01-22 NOTE — Progress Notes (Signed)
GYN ENCOUNTER NOTE  Subjective:       Veronica Harmon is a 28 y.o. G85P2002 female is here for gynecologic evaluation of the following issues:  1. Breakthrough bleeding on Nexplanon  Reports severe abdominal pain and cramping/dull pain for the last five (5) days with vaginal bleeding with large lots for the last three (3) days.   No history of bleeding with Nexplanon, second implant expires next month.   Personal history of ovarian cyst. Family history of uterine fibroids. Questions PCOS due to increased facial hair and acne.   Denies difficulty breathing or respiratory distress, chest pain, dysuria, and leg pain or swelling.    Gynecologic History  No LMP recorded. Patient has had an implant.  Contraception: Nexplanon, expires next month  Last Pap: 07/2018. Results were: Negative/Negative  Obstetric History  OB History  Gravida Para Term Preterm AB Living  2 2 2     2   SAB TAB Ectopic Multiple Live Births          2    # Outcome Date GA Lbr Len/2nd Weight Sex Delivery Anes PTL Lv  2 Term 12/20/12   7 lb 8 oz (3.402 kg) F Vag-Spont EPI N LIV  1 Term 04/19/07   7 lb 3 oz (3.26 kg) M Vag-Spont EPI N LIV    Past Medical History:  Diagnosis Date  . Anxiety   . Chlamydia infection   . Chronic neck pain   . Depression     Past Surgical History:  Procedure Laterality Date  . CERVICAL DISC ARTHROPLASTY N/A 06/28/2018   Procedure: Cervical five-six Artificial disc replacement;  Surgeon: 06/30/2018, MD;  Location: [facility] OR;  Service: Neurosurgery;  Laterality: N/A;  Cervical five-six Artificial disc replacement  . WISDOM TOOTH EXTRACTION      Current Outpatient Medications on File Prior to Visit  Medication Sig Dispense Refill  . ALPRAZolam (XANAX) 1 MG tablet Take 0.5 tablets (0.5 mg total) by mouth 3 (three) times daily as needed for anxiety. 60 tablet 2  . Aspirin-Acetaminophen-Caffeine 500-325-65 MG PACK Take 2 tablets by mouth every 6 (six) hours as needed for  headache or migraine.     . cyclobenzaprine (FLEXERIL) 10 MG tablet Take 1 tablet (10 mg total) by mouth 3 (three) times daily as needed for muscle spasms. 30 tablet 0  . etonogestrel (NEXPLANON) 68 MG IMPL implant 68 mg by Subdermal route once.     [facility] ketorolac (TORADOL) 10 MG tablet Take 1 tablet (10 mg total) by mouth every 6 (six) hours as needed. 20 tablet 0  . Multiple Vitamin (MULTIVITAMIN WITH MINERALS) TABS tablet Take 1 tablet by mouth daily.    . traMADol (ULTRAM) 50 MG tablet Take 50 mg by mouth every morning.     No current facility-administered medications on file prior to visit.     Allergies  Allergen Reactions  . Allyl Isothiocyanate Hives  . Sulfa Antibiotics Other (See Comments)    UNKNOWN   . Vicodin [Hydrocodone-Acetaminophen] Itching, Nausea And Vomiting and Other (See Comments)    feels like something crawling.    Social History   Socioeconomic History  . Marital status: Divorced    Spouse name: Not on file  . Number of children: Not on file  . Years of education: Not on file  . Highest education level: Not on file  Occupational History  . Not on file  Social Needs  . Financial resource strain: Not on file  . Food insecurity  Worry: Not on file    Inability: Not on file  . Transportation needs    Medical: Not on file    Non-medical: Not on file  Tobacco Use  . Smoking status: Current Every Day Smoker    Packs/day: 0.50    Types: Cigarettes  . Smokeless tobacco: Never Used  Substance and Sexual Activity  . Alcohol use: Never    Frequency: Never  . Drug use: No  . Sexual activity: Yes    Birth control/protection: Implant  Lifestyle  . Physical activity    Days per week: Not on file    Minutes per session: Not on file  . Stress: Not on file  Relationships  . Social Herbalist on phone: Not on file    Gets together: Not on file    Attends religious service: Not on file    Active member of club or organization: Not on file     Attends meetings of clubs or organizations: Not on file    Relationship status: Not on file  . Intimate partner violence    Fear of current or ex partner: Not on file    Emotionally abused: Not on file    Physically abused: Not on file    Forced sexual activity: Not on file  Other Topics Concern  . Not on file  Social History Narrative  . Not on file    Family History  Problem Relation Age of Onset  . Cancer Paternal Aunt        breast  . Healthy Mother   . Healthy Father   . Ovarian cancer Neg Hx   . Colon cancer Neg Hx     The following portions of the patient's history were reviewed and updated as appropriate: allergies, current medications, past family history, past medical history, past social history, past surgical history and problem list.  Review of Systems  ROS negative except as noted above. Information obtained from patient.   Objective:   BP 109/73   Pulse 88   Ht 5\' 6"  (1.676 m)   Wt 167 lb 14.4 oz (76.2 kg)   BMI 27.10 kg/m    CONSTITUTIONAL: Well-developed, well-nourished female in no acute distress.   ABDOMEN: Soft, non distended. No Organomegaly. Left sided tenderness with palpation.   PELVIC:  External Genitalia: Normal  Vagina: Normal, swab collected  MUSCULOSKELETAL: Normal range of motion. No tenderness.  No cyanosis, clubbing, or edema.  Assessment:   1. Breakthrough bleeding on Nexplanon  - CBC - Estradiol - Ferritin - FSH/LH - TSH  2. Abnormal uterine bleeding  - CBC - Estradiol - Ferritin - FSH/LH - TSH - US PELVIS (TRANSABDOMINAL ONLY); Future - US PELVIS TRANSVAGINAL NON-OB (TV ONLY); Future  3. Family history of uterine fibroid  - US PELVIS (TRANSABDOMINAL ONLY); Future - US PELVIS TRANSVAGINAL NON-OB (TV ONLY); Future  4. History of ovarian cyst  - Testosterone, Free, Total, SHBG - Hemoglobin A1c  5. Acne, unspecified acne type  - Testosterone, Free, Total, SHBG - Hemoglobin A1c     Plan:   Labs: see  orders.   Will schedule ultrasound, see orders.   Reviewed red flag symptoms and when to call.   RTC for ultrasound and results review.    Diona Fanti, CNM [facility], [place] [facility]

## 2019-01-24 LAB — CERVICOVAGINAL ANCILLARY ONLY
Bacterial Vaginitis (gardnerella): NEGATIVE
Candida Glabrata: NEGATIVE
Candida Vaginitis: NEGATIVE
Molecular Disclaimer: NEGATIVE
Molecular Disclaimer: NEGATIVE
Molecular Disclaimer: NORMAL

## 2019-01-25 ENCOUNTER — Encounter: Payer: Self-pay | Admitting: Certified Nurse Midwife

## 2019-01-25 ENCOUNTER — Other Ambulatory Visit: Payer: Self-pay

## 2019-01-25 ENCOUNTER — Ambulatory Visit (INDEPENDENT_AMBULATORY_CARE_PROVIDER_SITE_OTHER): Payer: Medicaid Other | Admitting: Certified Nurse Midwife

## 2019-01-25 ENCOUNTER — Ambulatory Visit (INDEPENDENT_AMBULATORY_CARE_PROVIDER_SITE_OTHER): Payer: Medicaid Other

## 2019-01-25 VITALS — BP 103/74 | HR 84 | Ht 66.0 in | Wt 170.4 lb

## 2019-01-25 DIAGNOSIS — Z8742 Personal history of other diseases of the female genital tract: Secondary | ICD-10-CM

## 2019-01-25 DIAGNOSIS — Z8489 Family history of other specified conditions: Secondary | ICD-10-CM

## 2019-01-25 DIAGNOSIS — Z87898 Personal history of other specified conditions: Secondary | ICD-10-CM | POA: Diagnosis not present

## 2019-01-25 DIAGNOSIS — N921 Excessive and frequent menstruation with irregular cycle: Secondary | ICD-10-CM | POA: Diagnosis not present

## 2019-01-25 DIAGNOSIS — N939 Abnormal uterine and vaginal bleeding, unspecified: Secondary | ICD-10-CM

## 2019-01-25 DIAGNOSIS — Z975 Presence of (intrauterine) contraceptive device: Secondary | ICD-10-CM | POA: Diagnosis not present

## 2019-01-25 LAB — TESTOSTERONE, FREE, TOTAL, SHBG
Sex Hormone Binding: 43.3 nmol/L (ref 24.6–122.0)
Testosterone, Free: 2.6 pg/mL (ref 0.0–4.2)
Testosterone: 31 ng/dL (ref 8–48)

## 2019-01-25 LAB — HEMOGLOBIN A1C
Est. average glucose Bld gHb Est-mCnc: 94 mg/dL
Hgb A1c MFr Bld: 4.9 % (ref 4.8–5.6)

## 2019-01-25 LAB — CBC
Hematocrit: 43.4 % (ref 34.0–46.6)
Hemoglobin: 14.6 g/dL (ref 11.1–15.9)
MCH: 30.4 pg (ref 26.6–33.0)
MCHC: 33.6 g/dL (ref 31.5–35.7)
MCV: 90 fL (ref 79–97)
Platelets: 323 10*3/uL (ref 150–450)
RBC: 4.8 x10E6/uL (ref 3.77–5.28)
RDW: 11.9 % (ref 11.7–15.4)
WBC: 9.2 10*3/uL (ref 3.4–10.8)

## 2019-01-25 LAB — FSH/LH
FSH: 4.8 m[IU]/mL
LH: 5 m[IU]/mL

## 2019-01-25 LAB — ESTRADIOL: Estradiol: 66.1 pg/mL

## 2019-01-25 LAB — FERRITIN: Ferritin: 62 ng/mL (ref 15–150)

## 2019-01-25 LAB — TSH: TSH: 1.08 u[IU]/mL (ref 0.450–4.500)

## 2019-01-25 MED ORDER — NORGESTIMATE-ETH ESTRADIOL 0.25-35 MG-MCG PO TABS: 1.0000 | ORAL_TABLET | Freq: Every day | ORAL | 11 refills | Status: DC

## 2019-01-25 NOTE — Progress Notes (Signed)
GYN ENCOUNTER NOTE  Subjective:       Veronica Harmon is a 28 y.o. 472P2002 female here for results review.   Denies difficulty breathing or respiratory distress, chest pain, abdominal pain, excessive vaginal bleeding, dysuria, and leg pain or swelling.   Gynecologic History  Patient's last menstrual period was 01/24/2019 (exact date).   Contraception: Nexplanon  Last Pap: 07/2018. Results were: Negative/Negative  Obstetric History  OB History  Gravida Para Term Preterm AB Living  2 2 2     2   SAB TAB Ectopic Multiple Live Births          2    # Outcome Date GA Lbr Len/2nd Weight Sex Delivery Anes PTL Lv  2 Term 12/20/12   7 lb 8 oz (3.402 kg) F Vag-Spont EPI N LIV  1 Term 04/19/07   7 lb 3 oz (3.26 kg) M Vag-Spont EPI N LIV    Past Medical History:  Diagnosis Date  . Anxiety   . Chlamydia infection   . Chronic neck pain   . Depression     Past Surgical History:  Procedure Laterality Date  . CERVICAL DISC ARTHROPLASTY N/A 06/28/2018   Procedure: Cervical five-six Artificial disc replacement;  Surgeon: Tressie StalkerJenkins, Jeffrey, MD;  [facility];  Service: Neurosurgery;  Laterality: N/A;  Cervical five-six Artificial disc replacement  . WISDOM TOOTH EXTRACTION      Current Outpatient Medications on File Prior to Visit  Medication Sig Dispense Refill  . ALPRAZolam (XANAX) 1 MG tablet Take 0.5 tablets (0.5 mg total) by mouth 3 (three) times daily as needed for anxiety. 60 tablet 2  . Aspirin-Acetaminophen-Caffeine 500-325-65 MG PACK Take 2 tablets by mouth every 6 (six) hours as needed for headache or migraine.     . cyclobenzaprine (FLEXERIL) 10 MG tablet Take 1 tablet (10 mg total) by mouth 3 (three) times daily as needed for muscle spasms. 30 tablet 0  . etonogestrel (NEXPLANON) 68 MG IMPL implant 68 mg by Subdermal route once.     Marland Kitchen. ketorolac (TORADOL) 10 MG tablet Take 1 tablet (10 mg total) by mouth every 6 (six) hours as needed. 20 tablet 0  . Multiple Vitamin  (MULTIVITAMIN WITH MINERALS) TABS tablet Take 1 tablet by mouth daily.    . traMADol (ULTRAM) 50 MG tablet Take 50 mg by mouth every morning.     No current facility-administered medications on file prior to visit.     Allergies  Allergen Reactions  . Allyl Isothiocyanate Hives  . Sulfa Antibiotics Other (See Comments)    UNKNOWN   . Vicodin [Hydrocodone-Acetaminophen] Itching, Nausea And Vomiting and Other (See Comments)    feels like something crawling.    Social History   Socioeconomic History  . Marital status: Divorced    Spouse name: Not on file  . Number of children: Not on file  . Years of education: Not on file  . Highest education level: Not on file  Occupational History  . Not on file  Social Needs  . Financial resource strain: Not on file  . Food insecurity    Worry: Not on file    Inability: Not on file  . Transportation needs    Medical: Not on file    Non-medical: Not on file  Tobacco Use  . Smoking status: Current Every Day Smoker    Packs/day: 0.50    Types: Cigarettes  . Smokeless tobacco: Never Used  Substance and Sexual Activity  . Alcohol use: Never  Frequency: Never  . Drug use: No  . Sexual activity: Yes    Birth control/protection: Implant  Lifestyle  . Physical activity    Days per week: Not on file    Minutes per session: Not on file  . Stress: Not on file  Relationships  . Social Musician on phone: Not on file    Gets together: Not on file    Attends religious service: Not on file    Active member of club or organization: Not on file    Attends meetings of clubs or organizations: Not on file    Relationship status: Not on file  . Intimate partner violence    Fear of current or ex partner: Not on file    Emotionally abused: Not on file    Physically abused: Not on file    Forced sexual activity: Not on file  Other Topics Concern  . Not on file  Social History Narrative  . Not on file    Family History   Problem Relation Age of Onset  . Cancer Paternal Aunt        breast  . Healthy Mother   . Healthy Father   . Ovarian cancer Neg Hx   . Colon cancer Neg Hx     The following portions of the patient's history were reviewed and updated as appropriate: allergies, current medications, past family history, past medical history, past social history, past surgical history and problem list.  Review of Systems  ROS negative except as noted above. Information obtained from patient.   Objective:   BP 103/74   Pulse 84   Ht 5\' 6"  (1.676 m)   Wt 170 lb 6.4 oz (77.3 kg)   LMP 01/24/2019 (Exact Date)   BMI 27.50 kg/m   CONSTITUTIONAL: Well-developed, well-nourished female in no acute distress.   ULTRASOUND REPORT  Location: [facility]  Date of Service: 01/25/2019     Indications:Pelvic Pain    Transvaginal ultrasound was utilized at this time.  Findings:  The uterus is retroverted and measures 6.7 x 4.5 x 3.8 cm. Echo texture is homogenous with out evidence of focal masses.  The Endometrium measures 2 mm.  Right Ovary measures 2.8 x 1.8 x 1.8 cm. It is normal in appearance.RT Hypoechoic lesion with good through transmission seen measuring 1.7 x 1.8 x 1.8cm. Left Ovary measures 2.8 x 2.2 x 2.3 cm. It is normal in appearance. Survey of the adnexa demonstrates no adnexal masses. There is  free fluid in the cul de sac.  Impression: 1. Rt ovarian hypoechoic lesion, most likely a dominate follicle as described above. 2. Trace free fluid in cul de sac and near Lt. Ovary.  Recommendations: 1.Clinical correlation with the patient's History and Physical Exam.  Recent Results (from the past 2160 hour(s))  CBC     Status: None   Collection Time: 01/22/19  9:31 AM  Result Value Ref Range   WBC 9.2 3.4 - 10.8 x10E3/uL   RBC 4.80 3.77 - 5.28 x10E6/uL   Hemoglobin 14.6 11.1 - 15.9 g/dL   Hematocrit 01/24/19 10.1 - 46.6 %   MCV 90 79 - 97 fL   MCH 30.4 26.6 - 33.0 pg   MCHC 33.6  31.5 - 35.7 g/dL   RDW 75.1 02.5 - 85.2 %   Platelets 323 150 - 450 x10E3/uL  Estradiol     Status: None   Collection Time: 01/22/19  9:31 AM  Result Value Ref Range  Estradiol 66.1 pg/mL    Comment:                     Adult Female:                       Follicular phase   68.3 -   166.0                       Ovulation phase    85.8 -   498.0                       Luteal phase       43.8 -   211.0                       Postmenopausal     <6.0 -    54.7                     Pregnancy                       1st trimester     215.0 - >4300.0 Roche ECLIA methodology   Ferritin     Status: None   Collection Time: 01/22/19  9:31 AM  Result Value Ref Range   Ferritin 62 15 - 150 ng/mL  FSH/LH     Status: None   Collection Time: 01/22/19  9:31 AM  Result Value Ref Range   LH 5.0 mIU/mL    Comment:                     Adult Female:                       Follicular phase      2.4 -  12.6                       Ovulation phase      14.0 -  95.6                       Luteal phase          1.0 -  11.4                       Postmenopausal        7.7 -  58.5    FSH 4.8 mIU/mL    Comment:                     Adult Female:                       Follicular phase      3.5 -  12.5                       Ovulation phase       4.7 -  21.5                       Luteal phase          1.7 -   7.7                       Postmenopausal  25.8 - 134.8   TSH     Status: None   Collection Time: 01/22/19  9:31 AM  Result Value Ref Range   TSH 1.080 0.450 - 4.500 uIU/mL  Testosterone, Free, Total, SHBG     Status: None   Collection Time: 01/22/19  9:31 AM  Result Value Ref Range   Testosterone 31 8 - 48 ng/dL   Testosterone, Free 2.6 0.0 - 4.2 pg/mL   Sex Hormone Binding 43.3 24.6 - 122.0 nmol/L  Hemoglobin A1c     Status: None   Collection Time: 01/22/19  9:31 AM  Result Value Ref Range   Hgb A1c MFr Bld 4.9 4.8 - 5.6 %    Comment:          Prediabetes: 5.7 - 6.4          Diabetes: >6.4           Glycemic control for adults with diabetes: <7.0    Est. average glucose Bld gHb Est-mCnc 94 mg/dL  Cervicovaginal ancillary only     Status: None   Collection Time: 01/22/19  2:52 PM  Result Value Ref Range   Bacterial Vaginitis (gardnerella) Negative    Candida Vaginitis Negative    Candida Glabrata Negative    Molecular Disclaimer      APTIMA BV assay on the Panther system is a target amplification nuclei   Molecular Disclaimer      acid probe test that utilizes target capture for in vitro Pharmacist, hospital and differentiation of ribosomal RNA from Gardnerella   Molecular Disclaimer      vaginalis.  This test was validated, and its performance characteristics   Molecular Disclaimer      determined by the reporting laboratory. This test is approved by the   [employer]      U.S. Food and Drug Administration. This laboratory is certified under   Engineer, drilling      CLIA-88 as qualified to perform high complexity clinical Engineering geologist testing. Normal Reference Range-Negative    Molecular Disclaimer      APTIMA CV/TV assay on the Panther system is a target amplification   Molecular Disclaimer      nuclei acid probe test that utilizes target capture for in vitro   Molecular Disclaimer      qualitative detection and differentiation of ribosomal RNA from   Molecular Disclaimer      C.albicans, , C.tropicalis, C.dubliniensis, C.parapsilosis and C.   Molecular Disclaimer      glabrata.  The C. glabrata result if positive is reported separately.   Molecular Disclaimer      This test was validated, and its performance characteristics determined   Engineer, drilling      by the reporting laboratory. It is approved by the U.S. Food and Surveyor, minerals. This laboratory is certified under CLIA-88 as Magazine features editor      to perform high complexity clinical laboratory  testing. Normal Reference   Molecular Disclaimer Range-Negative       Assessment:   1. Breakthrough bleeding on Nexplanon   2. History of ovarian cyst  Plan:   Ultrasound and labs results reviewed with patient, verbalized understanding.   Discussed management option. Patient desires Nexplanon removal, but uncertain regarding BTL or OCPs. Questions possible PCOS despite lab results.   Tubal consent completed.   Rx Sprintec, see orders.  RTC for Nexplanon removal and/or BTL consultation with MD.     Gunnar Bulla, CNM [facility], [facility] 01/25/19 11:46 AM

## 2019-01-25 NOTE — Patient Instructions (Signed)

## 2019-01-25 NOTE — Progress Notes (Signed)
Patient here to discuss ultrasound report.  

## 2019-03-07 ENCOUNTER — Other Ambulatory Visit: Payer: Self-pay | Admitting: *Deleted

## 2019-03-07 DIAGNOSIS — Z20822 Contact with and (suspected) exposure to covid-19: Secondary | ICD-10-CM

## 2019-03-08 LAB — NOVEL CORONAVIRUS, NAA: SARS-CoV-2, NAA: NOT DETECTED

## 2019-03-13 ENCOUNTER — Ambulatory Visit: Payer: Medicaid Other | Admitting: Obstetrics and Gynecology

## 2019-03-18 ENCOUNTER — Encounter: Payer: Self-pay | Admitting: Certified Nurse Midwife

## 2019-03-18 ENCOUNTER — Other Ambulatory Visit: Payer: Self-pay

## 2019-03-18 ENCOUNTER — Ambulatory Visit (INDEPENDENT_AMBULATORY_CARE_PROVIDER_SITE_OTHER): Payer: Medicaid Other | Admitting: Certified Nurse Midwife

## 2019-03-18 VITALS — BP 114/75 | HR 101 | Ht 66.0 in | Wt 172.4 lb

## 2019-03-18 DIAGNOSIS — Z3049 Encounter for surveillance of other contraceptives: Secondary | ICD-10-CM

## 2019-03-18 DIAGNOSIS — Z3046 Encounter for surveillance of implantable subdermal contraceptive: Secondary | ICD-10-CM

## 2019-03-18 NOTE — Patient Instructions (Addendum)

## 2019-03-18 NOTE — Progress Notes (Signed)
Veronica Harmon is a 28 y.o. year old G10P2002 Caucasian female here for Nexplanon removal.  She was given informed consent for removal of her Nexplanon.  No LMP recorded. Patient has had an implant.  BP 114/75   Pulse (!) 101   Ht 5\' 6"  (1.676 m)   Wt 172 lb 7 oz (78.2 kg)   BMI 27.83 kg/m  No LMP recorded. Patient has had an implant. No results found for this or any previous visit (from the past 24 hour(s)).   Appropriate time out taken. Nexplanon site identified.  Area prepped in usual sterile fashon. Two cc's of 2% lidocaine was used to anesthetize the area. A small stab incision was made right beside the implant on the distal portion.  The Nexplanon rod was grasped using hemostats and removed intact without difficulty.  Steri-strips and a pressure bandage was applied.  There was less than 3 cc blood loss. There were no complications.  The patient tolerated the procedure well.  She was instructed to keep the area clean and dry, remove pressure bandage in 24 hours, and keep insertion site covered with the steri-strips for 3-5 days.  She will use OCP which she has a prescription for from M. Lawhorn CNM.   Follow-up PRN problems.  Philip Aspen, CNM

## 2019-04-02 DIAGNOSIS — U071 COVID-19: Secondary | ICD-10-CM

## 2019-04-02 HISTORY — DX: COVID-19: U07.1

## 2019-04-03 ENCOUNTER — Other Ambulatory Visit: Payer: Self-pay

## 2019-04-03 DIAGNOSIS — Z20822 Contact with and (suspected) exposure to covid-19: Secondary | ICD-10-CM

## 2019-04-06 LAB — NOVEL CORONAVIRUS, NAA: SARS-CoV-2, NAA: NOT DETECTED

## 2019-04-08 ENCOUNTER — Encounter: Payer: Self-pay | Admitting: Certified Nurse Midwife

## 2019-04-08 ENCOUNTER — Other Ambulatory Visit: Payer: Self-pay

## 2019-04-08 MED ORDER — METRONIDAZOLE 0.75 % VA GEL: 1.0000 | Freq: Every day | VAGINAL | 5 refills | Status: DC

## 2019-04-08 NOTE — Telephone Encounter (Signed)
Refill sent per patient request. Mychart message sent to patient.

## 2019-05-01 ENCOUNTER — Other Ambulatory Visit: Payer: Self-pay

## 2019-05-01 MED ORDER — ONDANSETRON HCL 4 MG PO TABS: 4.0000 mg | ORAL_TABLET | Freq: Three times a day (TID) | ORAL | 0 refills | Status: DC | PRN

## 2019-05-01 NOTE — Telephone Encounter (Signed)
zofran sent per patient request

## 2019-05-06 ENCOUNTER — Other Ambulatory Visit: Payer: Self-pay | Admitting: Certified Nurse Midwife

## 2019-05-06 MED ORDER — ALPRAZOLAM 1 MG PO TABS: 0.5000 mg | ORAL_TABLET | Freq: Two times a day (BID) | ORAL | 0 refills | Status: DC | PRN

## 2019-05-06 NOTE — Progress Notes (Signed)
Order placed for xanax refill x 1 , pt instructed that she will need to see behavioral health to manage in the future. Offered referral.   Doreene Burke, CNM

## 2019-05-08 ENCOUNTER — Other Ambulatory Visit: Payer: Self-pay | Admitting: Certified Nurse Midwife

## 2019-05-08 DIAGNOSIS — F419 Anxiety disorder, unspecified: Secondary | ICD-10-CM

## 2019-05-08 NOTE — Progress Notes (Signed)
Order placed for referral for anxiety and xanax management per pt request.   Doreene Burke, CNM

## 2019-06-25 ENCOUNTER — Other Ambulatory Visit: Payer: Self-pay

## 2019-06-25 MED ORDER — NORGESTIMATE-ETH ESTRADIOL 0.25-35 MG-MCG PO TABS: 1.0000 | ORAL_TABLET | Freq: Every day | ORAL | 1 refills | Status: DC

## 2019-07-08 ENCOUNTER — Encounter: Payer: Medicaid Other | Admitting: Certified Nurse Midwife

## 2019-07-17 ENCOUNTER — Encounter: Payer: Medicaid Other | Admitting: Obstetrics and Gynecology

## 2019-08-21 ENCOUNTER — Other Ambulatory Visit: Payer: Self-pay

## 2019-08-21 MED ORDER — NORGESTIMATE-ETH ESTRADIOL 0.25-35 MG-MCG PO TABS: 1.0000 | ORAL_TABLET | Freq: Every day | ORAL | 1 refills | Status: DC

## 2019-08-21 NOTE — Telephone Encounter (Signed)
Refill sent.

## 2019-10-30 ENCOUNTER — Other Ambulatory Visit: Payer: Self-pay

## 2019-10-30 ENCOUNTER — Telehealth (INDEPENDENT_AMBULATORY_CARE_PROVIDER_SITE_OTHER): Payer: Medicaid Other | Admitting: Psychiatry

## 2019-10-30 DIAGNOSIS — Z5329 Procedure and treatment not carried out because of patient's decision for other reasons: Secondary | ICD-10-CM

## 2019-10-30 NOTE — Progress Notes (Signed)
No response to call or text or video invite  

## 2019-11-26 ENCOUNTER — Encounter: Payer: Self-pay | Admitting: Certified Nurse Midwife

## 2019-11-26 ENCOUNTER — Ambulatory Visit (INDEPENDENT_AMBULATORY_CARE_PROVIDER_SITE_OTHER): Payer: Medicaid Other | Admitting: Certified Nurse Midwife

## 2019-11-26 VITALS — BP 120/80 | HR 121 | Ht 66.0 in | Wt 165.9 lb

## 2019-11-26 DIAGNOSIS — N926 Irregular menstruation, unspecified: Secondary | ICD-10-CM | POA: Diagnosis not present

## 2019-11-26 LAB — POCT URINE PREGNANCY: Preg Test, Ur: POSITIVE — AB

## 2019-11-26 MED ORDER — BONJESTA 20-20 MG PO TBCR: 1.0000 | EXTENDED_RELEASE_TABLET | Freq: Two times a day (BID) | ORAL | 4 refills | Status: DC

## 2019-11-26 NOTE — Patient Instructions (Signed)

## 2019-11-26 NOTE — Progress Notes (Signed)
Subjective:    Veronica Harmon is a 29 y.o. female who presents for evaluation of amenorrhea. She believes she could be pregnant. was not planned but plans to keep pregnancy  Sexual Activity: single partner, contraception: OCP (estrogen/progesterone). Current symptoms also include: nausea and dizzy. Last period was abnormal.   Patient's last menstrual period was 10/19/2019. The following portions of the patient's history were reviewed and updated as appropriate: allergies, current medications, past family history, past medical history, past social history, past surgical history and problem list.  Review of Systems Pertinent items are noted in HPI.     Objective:    BP (!) 156/88   Pulse (!) 121   Ht 5\' 6"  (1.676 m)   Wt 165 lb 14.4 oz (75.3 kg)   LMP 10/19/2019 Comment: spotting only  BMI 26.78 kg/m  General: alert, cooperative, appears stated age and no acute distress    Lab Review Urine HCG: positive    Assessment:    Absence of menstruation.     Plan:  Positive: EDC: unsure. Briefly discussed pre-natal care options Md or midwifery care reviewed. pT plans to be midwife pt. . Encouraged well-balanced diet, plenty of rest when needed, pre-natal vitamins daily and walking for exercise. Discussed self-help for nausea, avoiding OTC medications until consulting provider or pharmacist, other than Tylenol as needed, minimal caffeine (1-2 cups daily) and avoiding alcohol. She will schedule u/s for dating this week, Nurse visit @ 10 wks and  her initial OB visit at 12 wks. Feel free to call with any questions. Orders placed for Bonjesta for nausea. Self help measures and precautions reviewed for dizziness in pregnancy. She verbalizes and agrees to plan .  10/21/2019, CNM

## 2019-11-28 ENCOUNTER — Other Ambulatory Visit: Payer: Self-pay | Admitting: Surgical

## 2019-11-28 MED ORDER — DOXYLAMINE-PYRIDOXINE 10-10 MG PO TBEC: 2.0000 | DELAYED_RELEASE_TABLET | Freq: Every day | ORAL | 5 refills | Status: DC

## 2019-12-03 ENCOUNTER — Ambulatory Visit (INDEPENDENT_AMBULATORY_CARE_PROVIDER_SITE_OTHER): Payer: Medicaid Other

## 2019-12-03 DIAGNOSIS — Z3A01 Less than 8 weeks gestation of pregnancy: Secondary | ICD-10-CM | POA: Diagnosis not present

## 2019-12-03 DIAGNOSIS — N926 Irregular menstruation, unspecified: Secondary | ICD-10-CM | POA: Diagnosis not present

## 2019-12-11 ENCOUNTER — Other Ambulatory Visit: Payer: Self-pay

## 2019-12-11 MED ORDER — ONDANSETRON HCL 4 MG PO TABS: 4.0000 mg | ORAL_TABLET | Freq: Three times a day (TID) | ORAL | 2 refills | Status: DC | PRN

## 2019-12-30 NOTE — Telephone Encounter (Signed)
Pt called in and stated that she was having some cramping and when she wipes she has light pinkish discharge. The pt stated that she sent a message to the nurse and I saw where the message was routed to the provider. I informed the pt and the pt verbally understood

## 2020-01-07 ENCOUNTER — Telehealth: Payer: Self-pay | Admitting: Certified Nurse Midwife

## 2020-01-07 ENCOUNTER — Encounter: Payer: Self-pay | Admitting: Certified Nurse Midwife

## 2020-01-07 NOTE — Telephone Encounter (Signed)
Patients dentistry called in requesting clearance for her to be able to have X-Rays done at her appointment. Could you please advise?  Fax number is [phone]  [facility]

## 2020-01-07 NOTE — Telephone Encounter (Signed)
Dental letter sent via MyChart. Thanks, Serafina Royals, CNM

## 2020-01-09 ENCOUNTER — Other Ambulatory Visit (HOSPITAL_COMMUNITY)
Admission: RE | Admit: 2020-01-09 | Discharge: 2020-01-09 | Disposition: A | Discharge status: Home / Self Care | Payer: Medicaid Other | Source: Ambulatory Visit | Attending: Certified Nurse Midwife | Admitting: Certified Nurse Midwife

## 2020-01-09 ENCOUNTER — Other Ambulatory Visit: Payer: Self-pay

## 2020-01-09 ENCOUNTER — Ambulatory Visit (INDEPENDENT_AMBULATORY_CARE_PROVIDER_SITE_OTHER): Payer: Medicaid Other | Admitting: Certified Nurse Midwife

## 2020-01-09 ENCOUNTER — Encounter: Payer: Self-pay | Admitting: Certified Nurse Midwife

## 2020-01-09 VITALS — BP 102/62 | Wt 168.4 lb

## 2020-01-09 DIAGNOSIS — Z3A11 11 weeks gestation of pregnancy: Secondary | ICD-10-CM | POA: Insufficient documentation

## 2020-01-09 DIAGNOSIS — Z1379 Encounter for other screening for genetic and chromosomal anomalies: Secondary | ICD-10-CM

## 2020-01-09 DIAGNOSIS — Z3481 Encounter for supervision of other normal pregnancy, first trimester: Secondary | ICD-10-CM | POA: Insufficient documentation

## 2020-01-09 DIAGNOSIS — R319 Hematuria, unspecified: Secondary | ICD-10-CM

## 2020-01-09 DIAGNOSIS — Z113 Encounter for screening for infections with a predominantly sexual mode of transmission: Secondary | ICD-10-CM | POA: Diagnosis present

## 2020-01-09 DIAGNOSIS — O210 Mild hyperemesis gravidarum: Secondary | ICD-10-CM

## 2020-01-09 DIAGNOSIS — N898 Other specified noninflammatory disorders of vagina: Secondary | ICD-10-CM | POA: Diagnosis present

## 2020-01-09 DIAGNOSIS — F419 Anxiety disorder, unspecified: Secondary | ICD-10-CM

## 2020-01-09 DIAGNOSIS — Z130 Encounter for screening for diseases of the blood and blood-forming organs and certain disorders involving the immune mechanism: Secondary | ICD-10-CM

## 2020-01-09 LAB — POCT URINALYSIS DIPSTICK OB
Bilirubin, UA: NEGATIVE
Glucose, UA: NEGATIVE
Ketones, UA: NEGATIVE
Leukocytes, UA: NEGATIVE
Nitrite, UA: NEGATIVE
POC,PROTEIN,UA: NEGATIVE
Spec Grav, UA: >=1.03 — AB (ref 1.010–1.025)
Urobilinogen, UA: 0.2 E.U./dL
pH, UA: 6 (ref 5.0–8.0)

## 2020-01-09 MED ORDER — METOCLOPRAMIDE HCL 10 MG PO TABS: 10.0000 mg | ORAL_TABLET | Freq: Four times a day (QID) | ORAL | 2 refills | Status: DC | PRN

## 2020-01-09 MED ORDER — POLYETHYLENE GLYCOL 3350 17 GM/SCOOP PO POWD: 1.0000 | Freq: Once | ORAL | 0 refills | Status: AC

## 2020-01-09 MED ORDER — FAMOTIDINE 20 MG PO TABS: 20.0000 mg | ORAL_TABLET | Freq: Two times a day (BID) | ORAL | 3 refills | Status: DC

## 2020-01-09 NOTE — Patient Instructions (Signed)
Healthy Weight Gain During Pregnancy, Adult A certain amount of weight gain during pregnancy is normal and healthy. How much weight you should gain depends on your overall health and a measurement called BMI (body mass index). BMI is an estimate of your body fat based on your height and weight. You can use an online calculator to figure out your BMI, or you can ask your health care provider to calculate it for you at your next visit. Your recommended pregnancy weight gain is based on your pre-pregnancy BMI. General guidelines for a healthy total weight gain during pregnancy are listed below. If your BMI at or before the start of your pregnancy is:  Less than 18.5 (underweight), you should gain 28-40 lb (13-18 kg).  18.5-24.9 (normal weight), you should gain 25-35 lb (11-16 kg).  25-29.9 (overweight), you should gain 15-25 lb (7-11 kg).  30 or higher (obese), you should gain 11-20 lb (5-9 kg). These ranges vary depending on your individual health. If you are carrying more than one baby (multiples), it may be safe to gain more weight than these recommendations. If you gain less weight than recommended, that may be safe as long as your baby is growing and developing normally. How can unhealthy weight gain affect me and my baby? Gaining too much weight during pregnancy can lead to pregnancy complications, such as:  A temporary form of diabetes that develops during pregnancy (gestational diabetes).  High blood pressure during pregnancy and protein in your urine (preeclampsia).  High blood pressure during pregnancy without protein in your urine (gestational hypertension).  Your baby having a high weight at birth, which may: ? Raise your risk of having a more difficult delivery or a surgical delivery (cesarean delivery, or C-section). ? Raise your child's risk of developing obesity during childhood. Not gaining enough weight can be life-threatening for your baby, and it may raise your baby's chances  of:  Being born early (preterm).  Growing more slowly than normal during pregnancy (growth restriction).  Having a low weight at birth. What actions can I take to gain a healthy amount of weight during pregnancy? General instructions  Keep track of your weight gain during pregnancy.  Take over-the-counter and prescription medicines only as told by your health care provider. Take all prenatal supplements as directed.  Keep all health care visits during pregnancy (prenatal visits). These visits are a good time to discuss your weight gain. Your health care provider will weigh you at each visit to make sure you are gaining a healthy amount of weight. Nutrition   Eat a balanced, nutrient-rich diet. Eat plenty of: ? Fruits and vegetables, such as berries and broccoli. ? Whole grains, such as millet, barley, whole-wheat breads and cereals, and oatmeal. ? Low-fat dairy products or non-dairy products such as almond milk or rice milk. ? Protein foods, such as lean meat, chicken, eggs, and legumes (such as peas, beans, soybeans, and lentils).  Avoid foods that are fried or have a lot of fat, salt (sodium), or sugar.  Drink enough fluid to keep your urine pale yellow.  Choose healthy snack and drink options when you are at work or on the go: ? Drink water. Avoid soda, sports drinks, and juices that have added sugar. ? Avoid drinks with caffeine, such as coffee and energy drinks. ? Eat snacks that are high in protein, such as nuts, protein bars, and low-fat yogurt. ? Carry convenient snacks in your purse that do not need refrigeration, such as a pack of   trail mix, an apple, or a granola bar.  If you need help improving your diet, work with a health care provider or a diet and nutrition specialist (dietitian). Activity   Exercise regularly, as told by your health care provider. ? If you were active before becoming pregnant, you may be able to continue your regular fitness activities. ? If  you were not active before pregnancy, you may gradually build up to exercising for 30 or more minutes on most days of the week. This may include walking, swimming, or yoga.  Ask your health care provider what activities are safe for you. Talk with your health care provider about whether you may need to be excused from certain school or work activities. Where to find more information Learn more about managing your weight gain during pregnancy from:  American Pregnancy Association: www.americanpregnancy.org  [place] Department [employer] Agriculture pregnancy weight gain calculator: [employer] Summary  Too much weight gain during pregnancy can lead to complications for you and your baby.  Find out your pre-pregnancy BMI to determine how much weight gain is healthy for you.  Eat nutritious foods and stay active.  Keep all of your prenatal visits as told by your health care provider. This information is not intended to replace advice given to you by your health care provider. Make sure you discuss any questions you have with your health care provider. Document Revised: 01/09/2019 Document Reviewed: 01/06/2017 Elsevier Patient Education  [place].   Exercise During Pregnancy Exercise is an important part of being healthy for people of all ages. Exercise improves the function of your heart and lungs and helps you maintain strength, flexibility, and a healthy body weight. Exercise also boosts energy levels and elevates mood. Most women should exercise regularly during pregnancy. In rare cases, women with certain medical conditions or complications may be asked to limit or avoid exercise during pregnancy. How does this affect me? Along with maintaining general strength and flexibility, exercising during pregnancy can help:  Keep strength in muscles that are used during labor and childbirth.  Decrease low back pain.  Reduce symptoms of depression.  Control weight gain during  pregnancy.  Reduce the risk of needing insulin if you develop diabetes during pregnancy.  Decrease the risk of cesarean delivery.  Speed up your recovery after giving birth. How does this affect my baby? Exercise can help you have a healthy pregnancy. Exercise does not cause premature birth. It will not cause your baby to weigh less at birth. What exercises can I do? Many exercises are safe for you to do during pregnancy. Do a variety of exercises that safely increase your heart and breathing rates and help you build and maintain muscle strength. Do exercises exactly as told by your health care provider. You may do these exercises:  Walking or hiking.  Swimming.  Water aerobics.  Riding a stationary bike.  Strength training.  Modified yoga or Pilates. Tell your instructor that you are pregnant. Avoid overstretching, and avoid lying on your back for long periods of time.  Running or jogging. Only choose this type of exercise if you: ? Ran or jogged regularly before your pregnancy. ? Can run or jog and still talk in complete sentences. What exercises should I avoid? Depending on your level of fitness and whether you exercised regularly before your pregnancy, you may be told to limit high-intensity exercise. You can tell that you are exercising at a high intensity if you are breathing much harder and faster and  cannot hold a conversation while exercising. You must avoid:  Contact sports.  Activities that put you at risk for falling on or being hit in the belly, such as downhill skiing, water skiing, surfing, rock climbing, cycling, gymnastics, and horseback riding.  Scuba diving.  Skydiving.  Yoga or Pilates in a room that is heated to high temperatures.  Jogging or running, unless you ran or jogged regularly before your pregnancy. While jogging or running, you should always be able to talk in full sentences. Do not run or jog so fast that you are unable to have a  conversation.  Do not exercise at more than 6,000 feet above sea level (high elevation) if you are not used to exercising at high elevation. How do I exercise in a safe way?   Avoid overheating. Do not exercise in very high temperatures.  Wear loose-fitting, breathable clothes.  Avoid dehydration. Drink enough water before, during, and after exercise to keep your urine pale yellow.  Avoid overstretching. Because of hormone changes during pregnancy, it is easy to overstretch muscles, tendons, and ligaments during pregnancy.  Start slowly and ask your health care provider to recommend the types of exercise that are safe for you.  Do not exercise to lose weight. Follow these instructions at home:  Exercise on most days or all days of the week. Try to exercise for 30 minutes a day, 5 days a week, unless your health care provider tells you not to.  If you actively exercised before your pregnancy and you are healthy, your health care provider may tell you to continue to do moderate to high-intensity exercise.  If you are just starting to exercise or did not exercise much before your pregnancy, your health care provider may tell you to do low to moderate-intensity exercise. Questions to ask your health care provider  Is exercise safe for me?  What are signs that I should stop exercising?  Does my health condition mean that I should not exercise during pregnancy?  When should I avoid exercising during pregnancy? Stop exercising and contact a health care provider if: You have any unusual symptoms, such as:  Mild contractions of the uterus or cramps in the abdomen.  Dizziness that does not go away when you rest. Stop exercising and get help right away if: You have any unusual symptoms, such as:  Sudden, severe pain in your low back or your belly.  Mild contractions of the uterus or cramps in the abdomen that do not improve with rest and drinking fluids.  Chest pain.  Bleeding or  fluid leaking from your vagina.  Shortness of breath. These symptoms may represent a serious problem that is an emergency. Do not wait to see if the symptoms will go away. Get medical help right away. Call your local emergency services (911 in the [place] Do not drive yourself to the hospital. Summary  Most women should exercise regularly throughout pregnancy. In rare cases, women with certain medical conditions or complications may be asked to limit or avoid exercise during pregnancy.  Do not exercise to lose weight during pregnancy.  Your health care provider will tell you what level of physical activity is right for you.  Stop exercising and contact a health care provider if you have mild contractions of the uterus or cramps in the abdomen. Get help right away if these contractions or cramps do not improve with rest and drinking fluids.  Stop exercising and get help right away if you have sudden, severe  pain in your low back or belly, chest pain, shortness of breath, or bleeding or leaking of fluid from your vagina. This information is not intended to replace advice given to you by your health care provider. Make sure you discuss any questions you have with your health care provider. Document Revised: 08/09/2018 Document Reviewed: 05/23/2018 Elsevier Patient Education  2020 [employer]   Common Medications Safe in Pregnancy  Acne:      Constipation:  Benzoyl Peroxide     Colace  Clindamycin      Dulcolax Suppository  Topica Erythromycin     Fibercon  Salicylic Acid      Metamucil         Miralax AVOID:        Senakot   Accutane    Cough:  Retin-A       Cough Drops  Tetracycline      Phenergan w/ Codeine if Rx  Minocycline      Robitussin (Plain & DM)  Antibiotics:     Crabs/Lice:  Ceclor       RID  Cephalosporins    AVOID:  E-Mycins      Kwell  Keflex  Macrobid/Macrodantin   Diarrhea:  Penicillin      Kao-Pectate  Zithromax      Imodium AD         PUSH  FLUIDS AVOID:       Cipro     Fever:  Tetracycline      Tylenol (Regular or Extra  Minocycline       Strength)  Levaquin      Extra Strength-Do not          Exceed 8 tabs/24 hrs Caffeine:        <200mg/day (equiv. To 1 cup of coffee or  approx. 3 12 oz sodas)         Gas: Cold/Hayfever:       Gas-X  Benadryl      Mylicon  Claritin       Phazyme  **Claritin-D        Chlor-Trimeton    Headaches:  Dimetapp      ASA-Free Excedrin  Drixoral-Non-Drowsy     Cold Compress  Mucinex (Guaifenasin)     Tylenol (Regular or Extra  Sudafed/Sudafed-12 Hour     Strength)  **Sudafed PE Pseudoephedrine   Tylenol Cold & Sinus     Vicks Vapor Rub  Zyrtec  **AVOID if Problems With Blood Pressure         Heartburn: Avoid lying down for at least 1 hour after meals  Aciphex      Maalox     Rash:  Milk of Magnesia     Benadryl    Mylanta       1% Hydrocortisone Cream  Pepcid  Pepcid Complete   Sleep Aids:  Prevacid      Ambien   Prilosec       Benadryl  Rolaids       Chamomile Tea  Tums (Limit 4/day)     Unisom         Tylenol PM         Warm milk-add vanilla or  Hemorrhoids:       Sugar for taste  Anusol/Anusol H.C.  (RX: Analapram 2.5%)  Sugar Substitutes:  Hydrocortisone OTC     Ok in moderation  Preparation H      Tucks        Vaseline lotion applied to tissue with wiping      Herpes:     Throat:  Acyclovir      Oragel  Famvir  Valtrex     Vaccines:         Flu Shot Leg Cramps:       *Gardasil  Benadryl      Hepatitis A         Hepatitis B Nasal Spray:       Pneumovax  Saline Nasal Spray     Polio Booster         Tetanus Nausea:       Tuberculosis test or PPD  Vitamin B6 25 mg TID   AVOID:    Dramamine      *Gardasil  Emetrol       Live Poliovirus  Ginger Root 250 mg QID    MMR (measles, mumps &  High Complex Carbs @ Bedtime    rebella)  Sea Bands-Accupressure    Varicella (Chickenpox)  Unisom 1/2 tab TID     *No known complications           If received  before Pain:         Known pregnancy;   Darvocet       Resume series after  Lortab        Delivery  Percocet    Yeast:   Tramadol      Femstat  Tylenol 3      Gyne-lotrimin  Ultram       Monistat  Vicodin           MISC:         All Sunscreens           Hair Coloring/highlights          Insect Repellant's          (Including DEET)         Mystic Tans    Second Trimester of Pregnancy  The second trimester is from week 14 through week 27 (month 4 through 6). This is often the time in pregnancy that you feel your best. Often times, morning sickness has lessened or quit. You may have more energy, and you may get hungry more often. Your unborn baby is growing rapidly. At the end of the sixth month, he or she is about 9 inches long and weighs about 1 pounds. You will likely feel the baby move between 18 and 20 weeks of pregnancy. Follow these instructions at home: Medicines  Take over-the-counter and prescription medicines only as told by your doctor. Some medicines are safe and some medicines are not safe during pregnancy.  Take a prenatal vitamin that contains at least 600 micrograms (mcg) of folic acid.  If you have trouble pooping (constipation), take medicine that will make your stool soft (stool softener) if your doctor approves. Eating and drinking   Eat regular, healthy meals.  Avoid raw meat and uncooked cheese.  If you get low calcium from the food you eat, talk to your doctor about taking a daily calcium supplement.  Avoid foods that are high in fat and sugars, such as fried and sweet foods.  If you feel sick to your stomach (nauseous) or throw up (vomit): ? Eat 4 or 5 small meals a day instead of 3 large meals. ? Try eating a few soda crackers. ? Drink liquids between meals instead of during meals.  To prevent constipation: ? Eat foods that are high in fiber, like fresh fruits and vegetables, whole grains, and beans. ? Drink enough fluids  to keep your pee (urine)  clear or pale yellow. Activity  Exercise only as told by your doctor. Stop exercising if you start to have cramps.  Do not exercise if it is too hot, too humid, or if you are in a place of great height (high altitude).  Avoid heavy lifting.  Wear low-heeled shoes. Sit and stand up straight.  You can continue to have sex unless your doctor tells you not to. Relieving pain and discomfort  Wear a good support bra if your breasts are tender.  Take warm water baths (sitz baths) to soothe pain or discomfort caused by hemorrhoids. Use hemorrhoid cream if your doctor approves.  Rest with your legs raised if you have leg cramps or low back pain.  If you develop puffy, bulging veins (varicose veins) in your legs: ? Wear support hose or compression stockings as told by your doctor. ? Raise (elevate) your feet for 15 minutes, 3-4 times a day. ? Limit salt in your food. Prenatal care  Write down your questions. Take them to your prenatal visits.  Keep all your prenatal visits as told by your doctor. This is important. Safety  Wear your seat belt when driving.  Make a list of emergency phone numbers, including numbers for family, friends, the hospital, and police and fire departments. General instructions  Ask your doctor about the right foods to eat or for help finding a counselor, if you need these services.  Ask your doctor about local prenatal classes. Begin classes before month 6 of your pregnancy.  Do not use hot tubs, steam rooms, or saunas.  Do not douche or use tampons or scented sanitary pads.  Do not cross your legs for long periods of time.  Visit your dentist if you have not done so. Use a soft toothbrush to brush your teeth. Floss gently.  Avoid all smoking, herbs, and alcohol. Avoid drugs that are not approved by your doctor.  Do not use any products that contain nicotine or tobacco, such as cigarettes and e-cigarettes. If you need help quitting, ask your  doctor.  Avoid cat litter boxes and soil used by cats. These carry germs that can cause birth defects in the baby and can cause a loss of your baby (miscarriage) or stillbirth. Contact a doctor if:  You have mild cramps or pressure in your lower belly.  You have pain when you pee (urinate).  You have bad smelling fluid coming from your vagina.  You continue to feel sick to your stomach (nauseous), throw up (vomit), or have watery poop (diarrhea).  You have a nagging pain in your belly area.  You feel dizzy. Get help right away if:  You have a fever.  You are leaking fluid from your vagina.  You have spotting or bleeding from your vagina.  You have severe belly cramping or pain.  You lose or gain weight rapidly.  You have trouble catching your breath and have chest pain.  You notice sudden or extreme puffiness (swelling) of your face, hands, ankles, feet, or legs.  You have not felt the baby move in over an hour.  You have severe headaches that do not go away when you take medicine.  You have trouble seeing. Summary  The second trimester is from week 14 through week 27 (months 4 through 6). This is often the time in pregnancy that you feel your best.  To take care of yourself and your unborn baby, you will need to eat healthy meals,  take medicines only if your doctor tells you to do so, and do activities that are safe for you and your baby.  Call your doctor if you get sick or if you notice anything unusual about your pregnancy. Also, call your doctor if you need help with the right food to eat, or if you want to know what activities are safe for you. This information is not intended to replace advice given to you by your health care provider. Make sure you discuss any questions you have with your health care provider. Document Revised: 08/10/2018 Document Reviewed: 05/24/2016 [employer] Patient Education  [place].

## 2020-01-09 NOTE — Progress Notes (Signed)
NEW OB HISTORY AND PHYSICAL  SUBJECTIVE:       Veronica Harmon is a 29 y.o. [phone] female, Patient's last menstrual period was 10/19/2019., Estimated Date of Delivery: 07/30/20, [redacted]w[redacted]d, presents today for establishment of Prenatal Care.  Endorses nausea with occasional vomiting unrelieved by medication. Taking Diclegis at night and Zofran; however, Zofran causes constipation.   Reports intermittent pelvic or abdominal cramping, right side greater than left. History significant for ovarian cyst.   Desires genetic screening. Prefers midwifery care.   Denies difficulty breathing or respiratory distress, chest pain, dysuria, and leg pain or swelling.    Gynecologic History  Patient's last menstrual period was 10/19/2019.   Contraception: OCP (estrogen/progesterone)  Last Pap: 07/2018. Results were: Neg/Neg  Obstetric History  OB History  Gravida Para Term Preterm AB Living  4 2 2   1 2   SAB TAB Ectopic Multiple Live Births    1     2    # Outcome Date GA Lbr Len/2nd Weight Sex Delivery Anes PTL Lv  4 Current           3 Term 12/20/12   7 lb 8 oz (3.402 kg) F Vag-Spont EPI N LIV  2 TAB 2009          1 Term 04/19/07   7 lb 3 oz (3.26 kg) M Vag-Spont EPI N LIV    Past Medical History:  Diagnosis Date  . Anxiety   . Chlamydia infection   . Chronic neck pain   . COVID-19 04/2019  . Depression     Past Surgical History:  Procedure Laterality Date  . CERVICAL DISC ARTHROPLASTY N/A 06/28/2018   Procedure: Cervical five-six Artificial disc replacement;  Surgeon: 06/30/2018, MD;  Location: [facility] OR;  Service: Neurosurgery;  Laterality: N/A;  Cervical five-six Artificial disc replacement  . WISDOM TOOTH EXTRACTION      Current Outpatient Medications on File Prior to Visit  Medication Sig Dispense Refill  . Doxylamine-Pyridoxine ER (BONJESTA) 20-20 MG TBCR Take 1 capsule by mouth 2 (two) times daily. 62 tablet 4  . ondansetron (ZOFRAN) 4 MG tablet Take 1 tablet (4 mg total)  by mouth every 8 (eight) hours as needed for nausea or vomiting. 20 tablet 2  . Prenatal Vit-Fe Fumarate-FA (MULTIVITAMIN-PRENATAL) 27-0.8 MG TABS tablet Take 1 tablet by mouth daily at 12 noon.    . Aspirin-Acetaminophen-Caffeine [facility] MG PACK Take 2 tablets by mouth every 6 (six) hours as needed for headache or migraine.  (Patient not taking: Reported on 01/09/2020)    . Doxylamine-Pyridoxine (DICLEGIS) 10-10 MG TBEC Take 2 tablets by mouth at bedtime. If symptoms persist, add one tablet in the morning and one in the afternoon (Patient not taking: Reported on 01/09/2020) 100 tablet 5  . Multiple Vitamin (MULTIVITAMIN WITH MINERALS) TABS tablet Take 1 tablet by mouth daily.     No current facility-administered medications on file prior to visit.    Allergies  Allergen Reactions  . Allyl Isothiocyanate Hives  . Sulfa Antibiotics Other (See Comments)    UNKNOWN   . Vicodin [Hydrocodone-Acetaminophen] Itching, Nausea And Vomiting and Other (See Comments)    feels like something crawling.    Social History   Socioeconomic History  . Marital status: Divorced    Spouse name: Not on file  . Number of children: Not on file  . Years of education: Not on file  . Highest education level: Not on file  Occupational History  . Not on file  Tobacco Use  . Smoking status: Current Every Day Smoker    Packs/day: 0.50    Types: Cigarettes  . Smokeless tobacco: Never Used  Vaping Use  . Vaping Use: Former  Substance and Sexual Activity  . Alcohol use: Not Currently  . Drug use: No  . Sexual activity: Yes  Other Topics Concern  . Not on file  Social History Narrative  . Not on file   Social Determinants of Health   Financial Resource Strain:   . Difficulty of Paying Living Expenses: Not on file  Food Insecurity:   . Worried About Programme researcher, broadcasting/film/video in the Last Year: Not on file  . Ran Out of Food in the Last Year: Not on file  Transportation Needs:   . Lack of Transportation  (Medical): Not on file  . Lack of Transportation (Non-Medical): Not on file  Physical Activity:   . Days of Exercise per Week: Not on file  . Minutes of Exercise per Session: Not on file  Stress:   . Feeling of Stress : Not on file  Social Connections:   . Frequency of Communication with Friends and Family: Not on file  . Frequency of Social Gatherings with Friends and Family: Not on file  . Attends Religious Services: Not on file  . Active Member of Clubs or Organizations: Not on file  . Attends Banker Meetings: Not on file  . Marital Status: Not on file  Intimate Partner Violence:   . Fear of Current or Ex-Partner: Not on file  . Emotionally Abused: Not on file  . Physically Abused: Not on file  . Sexually Abused: Not on file    Family History  Problem Relation Age of Onset  . Cancer Paternal Aunt        breast  . Healthy Mother   . Healthy Father   . Ovarian cancer Neg Hx   . Colon cancer Neg Hx     The following portions of the patient's history were reviewed and updated as appropriate: allergies, current medications, past OB history, past medical history, past surgical history, past family history, past social history, and problem list.  Review of Systems:  ROS negative except as noted above. Information obtained from patient.   OBJECTIVE:  BP 102/62   Wt 168 lb 6.4 oz (76.4 kg)   LMP 10/19/2019 Comment: spotting only  BMI 27.18 kg/m   Initial Physical Exam (New OB)  GENERAL APPEARANCE: alert, well appearing, in no apparent distress  HEAD: normocephalic, atraumatic  MOUTH: mucous membranes moist, pharynx normal without lesions  THYROID: no thyromegaly or masses present  BREASTS: no masses noted, no significant tenderness, no palpable axillary nodes, no skin changes  LUNGS: clear to auscultation, no wheezes, rales or rhonchi, symmetric air entry  HEART: regular rate and rhythm, no murmurs  ABDOMEN: soft, nontender, nondistended, no  abnormal masses, no epigastric pain and FHT present  EXTREMITIES: no redness or tenderness in the calves or thighs, no edema  SKIN: normal coloration and turgor, no rashes; professional tattoos present  LYMPH NODES: no adenopathy palpable  NEUROLOGIC: alert, oriented, normal speech, no focal findings or movement disorder noted  PELVIC EXAM EXTERNAL GENITALIA: normal appearing vulva with no masses, tenderness or lesions VAGINA: vaginal swab collected  ASSESSMENT: Normal pregnancy Hematuria Vaginal itching STI screening Screening anemia Genetic screening Morning sickness Anxiety  PLAN: Prenatal care New OB counseling: The patient has been given an overview regarding routine prenatal care. Recommendations regarding diet, weight  gain, and exercise in pregnancy were given. Prenatal testing, optional genetic testing, and ultrasound use in pregnancy were reviewed.  Benefits of Breast Feeding were discussed. The patient is encouraged to consider nursing her baby post partum. See orders

## 2020-01-09 NOTE — Progress Notes (Signed)
Pt presents for NOB nurse interview visit. Pregnancy confirmation done 11/26/19  .U/S done 12/03/19. 5wk 5d EDD: 07/30/20 G4 K4401 . Pregnancy education material explained and given. 2 cats in the home. NOB labs ordered. Marland Kitchen HIV labs and Drug screen were explained optional and she did not decline. Drug screen ordered  PNV encouraged. Genetic screening options discussed . Genetic testing: Ordered.  Pt. Financial policy reviewed. FMLA paperwork policy reviewed and signed. All questions answered.

## 2020-01-09 NOTE — Addendum Note (Signed)
Addended by: Brooke Dare on: 01/09/2020 01:45 PM   Modules accepted: Orders

## 2020-01-10 ENCOUNTER — Encounter: Payer: Self-pay | Admitting: Certified Nurse Midwife

## 2020-01-10 DIAGNOSIS — Z6740 Type O blood, Rh positive: Secondary | ICD-10-CM | POA: Insufficient documentation

## 2020-01-10 LAB — CBC
Hematocrit: 38.9 % (ref 34.0–46.6)
Hemoglobin: 13.1 g/dL (ref 11.1–15.9)
MCH: 31.4 pg (ref 26.6–33.0)
MCHC: 33.7 g/dL (ref 31.5–35.7)
MCV: 93 fL (ref 79–97)
Platelets: 276 10*3/uL (ref 150–450)
RBC: 4.17 x10E6/uL (ref 3.77–5.28)
RDW: 12.7 % (ref 11.7–15.4)
WBC: 13 10*3/uL — ABNORMAL HIGH (ref 3.4–10.8)

## 2020-01-10 LAB — URINALYSIS, ROUTINE W REFLEX MICROSCOPIC
Bilirubin, UA: NEGATIVE
Glucose, UA: NEGATIVE
Ketones, UA: NEGATIVE
Leukocytes,UA: NEGATIVE
Nitrite, UA: NEGATIVE
Protein,UA: NEGATIVE
Specific Gravity, UA: 1.028 (ref 1.005–1.030)
Urobilinogen, Ur: 1 mg/dL (ref 0.2–1.0)
pH, UA: 5.5 (ref 5.0–7.5)

## 2020-01-10 LAB — TOXOPLASMA ANTIBODIES- IGG AND  IGM
Toxoplasma Antibody- IgM: <3 AU/mL (ref 0.0–7.9)
Toxoplasma IgG Ratio: <3 IU/mL (ref 0.0–7.1)

## 2020-01-10 LAB — RPR: RPR Ser Ql: NONREACTIVE

## 2020-01-10 LAB — MONITOR DRUG PROFILE 14(MW)
Amphetamine Scrn, Ur: NEGATIVE ng/mL
BARBITURATE SCREEN URINE: NEGATIVE ng/mL
BENZODIAZEPINE SCREEN, URINE: NEGATIVE ng/mL
Buprenorphine, Urine: NEGATIVE ng/mL
CANNABINOIDS UR QL SCN: NEGATIVE ng/mL
Cocaine (Metab) Scrn, Ur: NEGATIVE ng/mL
Creatinine(Crt), U: 123.5 mg/dL (ref 20.0–300.0)
Fentanyl, Urine: NEGATIVE pg/mL
Meperidine Screen, Urine: NEGATIVE ng/mL
Methadone Screen, Urine: NEGATIVE ng/mL
OXYCODONE+OXYMORPHONE UR QL SCN: NEGATIVE ng/mL
Opiate Scrn, Ur: NEGATIVE ng/mL
Ph of Urine: 5.4 (ref 4.5–8.9)
Phencyclidine Qn, Ur: NEGATIVE ng/mL
Propoxyphene Scrn, Ur: NEGATIVE ng/mL
SPECIFIC GRAVITY: 1.016
Tramadol Screen, Urine: NEGATIVE ng/mL

## 2020-01-10 LAB — MICROSCOPIC EXAMINATION
Casts: NONE SEEN /lpf
Epithelial Cells (non renal): >10 /hpf — AB (ref 0–10)

## 2020-01-10 LAB — ANTIBODY SCREEN: Antibody Screen: NEGATIVE

## 2020-01-10 LAB — ABO AND RH: Rh Factor: POSITIVE

## 2020-01-10 LAB — VARICELLA ZOSTER ANTIBODY, IGG: Varicella zoster IgG: 1308 index (ref >165)

## 2020-01-10 LAB — HIV ANTIBODY (ROUTINE TESTING W REFLEX): HIV Screen 4th Generation wRfx: NONREACTIVE

## 2020-01-10 LAB — RUBELLA SCREEN: Rubella Antibodies, IGG: 1.87 index (ref >0.99)

## 2020-01-10 LAB — NICOTINE SCREEN, URINE: Cotinine Ql Scrn, Ur: POSITIVE ng/mL — AB

## 2020-01-10 LAB — HEPATITIS B SURFACE ANTIGEN: Hepatitis B Surface Ag: NEGATIVE

## 2020-01-11 LAB — URINE CULTURE

## 2020-01-11 IMAGING — RF DG CERVICAL SPINE 2 OR 3 VIEWS
1 series · 2 of 2 positions shown · non-contrast
Comparison: None.

CLINICAL DATA: C5-6 artificial disc replacement.

EXAM:
DG C-ARM 61-120 MIN; CERVICAL SPINE - 2-3 VIEW

[Series 1: run · 2 of 2 slices shown]
[im 1/2]
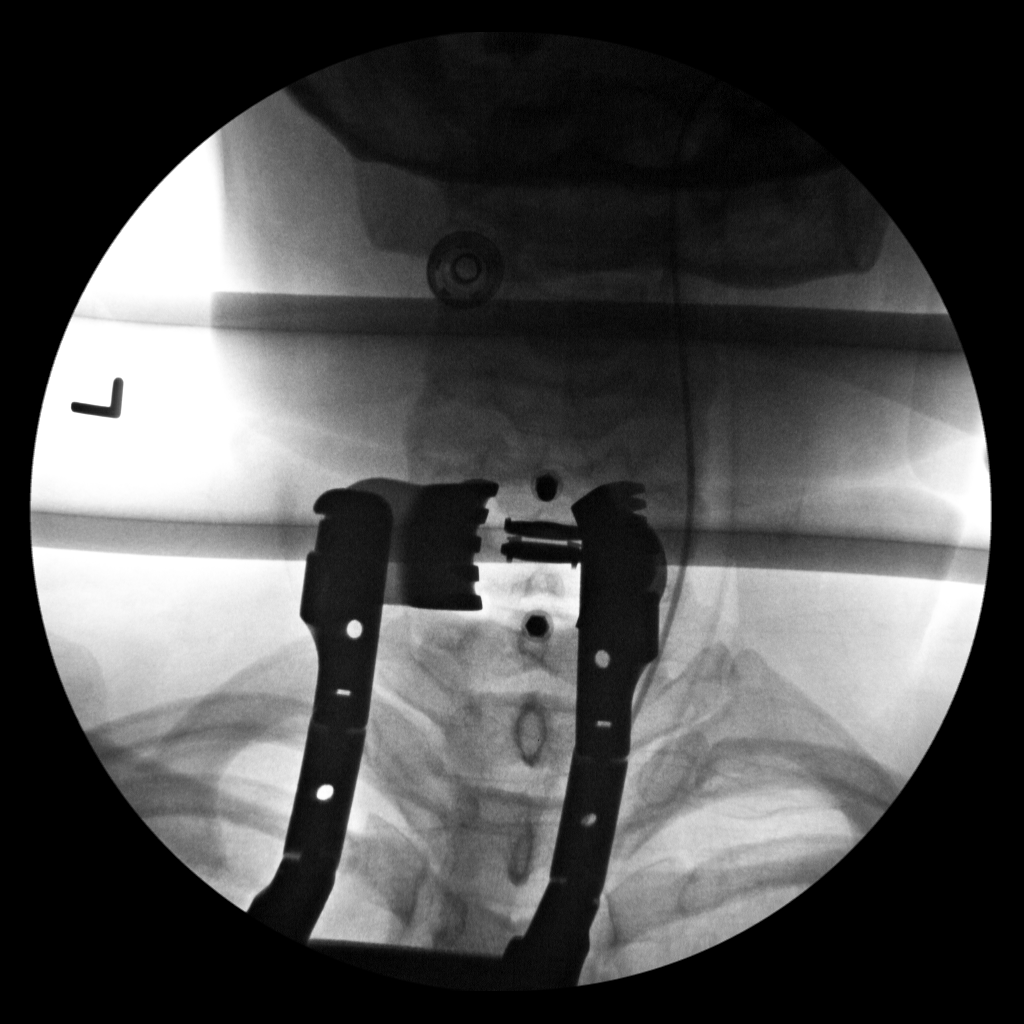
[im 2/2]
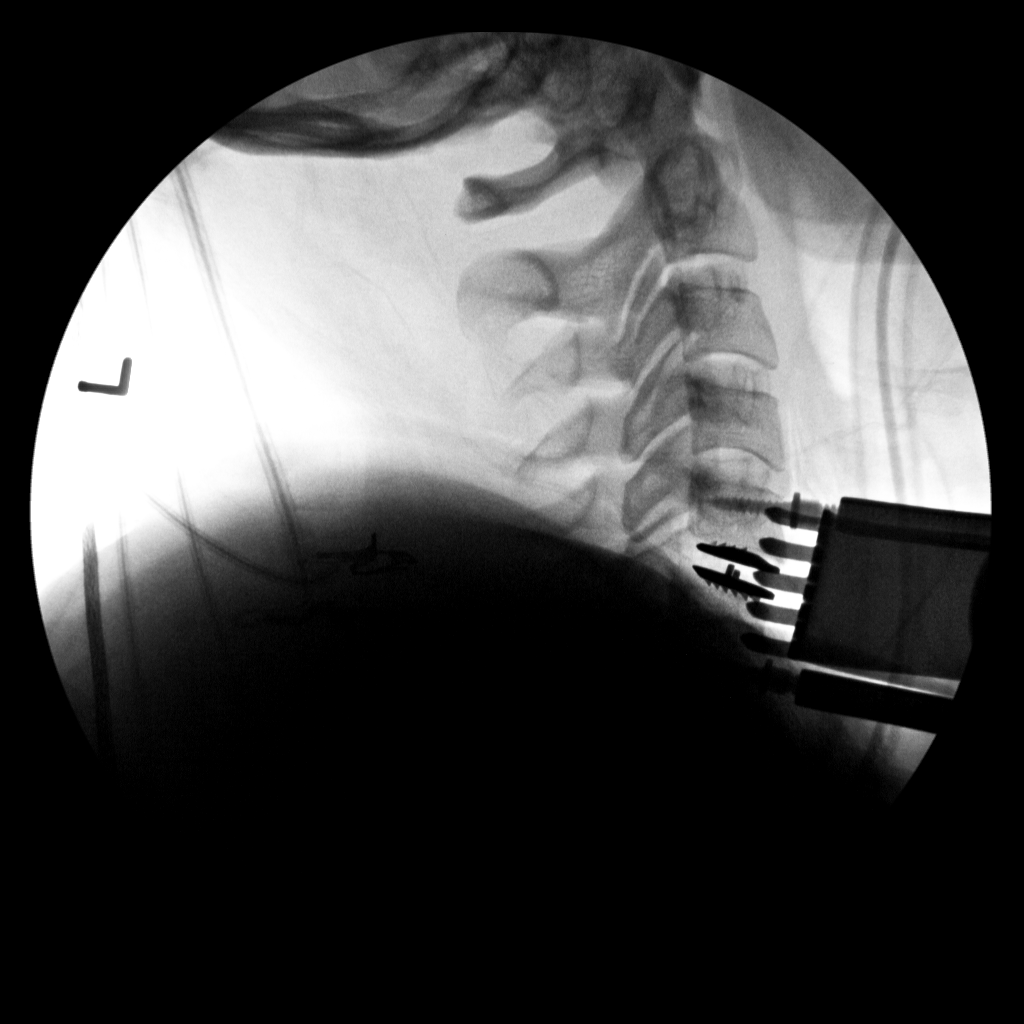

[2 of 2 positions shown; findings below may reference images not displayed]

FINDINGS: Two intraoperative fluoroscopic images of the cervical spine are
provided. Described placement hardware is placed at the C5-6 level.

Fluoroscopy was provided for 14 seconds.
IMPRESSION: Intraoperative fluoroscopic images during C5-6 disc replacement.

## 2020-01-12 ENCOUNTER — Encounter: Payer: Self-pay | Admitting: Certified Nurse Midwife

## 2020-01-12 DIAGNOSIS — F17200 Nicotine dependence, unspecified, uncomplicated: Secondary | ICD-10-CM | POA: Insufficient documentation

## 2020-01-13 LAB — CERVICOVAGINAL ANCILLARY ONLY
Bacterial Vaginitis (gardnerella): POSITIVE — AB
Candida Glabrata: NEGATIVE
Candida Vaginitis: POSITIVE — AB
Chlamydia: NEGATIVE
Comment: NEGATIVE
Comment: NEGATIVE
Comment: NEGATIVE
Comment: NEGATIVE
Comment: NEGATIVE
Comment: NORMAL
Neisseria Gonorrhea: NEGATIVE
Trichomonas: NEGATIVE

## 2020-01-20 ENCOUNTER — Telehealth: Payer: Self-pay

## 2020-01-20 NOTE — Telephone Encounter (Signed)
mychart message sent

## 2020-02-03 ENCOUNTER — Telehealth: Payer: Self-pay

## 2020-02-03 ENCOUNTER — Other Ambulatory Visit: Payer: Self-pay

## 2020-02-03 NOTE — Telephone Encounter (Signed)
Patient called in stating that she was having some brown discharge and she was having some cramping yesterday however those have stopped today. Patient states the discharge is medium in flow. Could you please advise?

## 2020-02-03 NOTE — Telephone Encounter (Signed)
mychart message sent

## 2020-02-04 ENCOUNTER — Other Ambulatory Visit: Payer: Self-pay

## 2020-02-04 ENCOUNTER — Emergency Department: Payer: Medicaid Other

## 2020-02-04 ENCOUNTER — Emergency Department
Admission: EM | Admit: 2020-02-04 | Discharge: 2020-02-04 | Disposition: A | Discharge status: Home / Self Care | Payer: Medicaid Other | Attending: Emergency Medicine | Admitting: Emergency Medicine

## 2020-02-04 ENCOUNTER — Encounter: Payer: Self-pay | Admitting: Certified Nurse Midwife

## 2020-02-04 ENCOUNTER — Ambulatory Visit (INDEPENDENT_AMBULATORY_CARE_PROVIDER_SITE_OTHER): Payer: Medicaid Other | Admitting: Certified Nurse Midwife

## 2020-02-04 VITALS — BP 105/70 | HR 89 | Wt 169.4 lb

## 2020-02-04 DIAGNOSIS — O200 Threatened abortion: Secondary | ICD-10-CM | POA: Diagnosis not present

## 2020-02-04 DIAGNOSIS — Z3A15 15 weeks gestation of pregnancy: Secondary | ICD-10-CM | POA: Insufficient documentation

## 2020-02-04 DIAGNOSIS — O26832 Pregnancy related renal disease, second trimester: Secondary | ICD-10-CM | POA: Diagnosis present

## 2020-02-04 DIAGNOSIS — O99332 Smoking (tobacco) complicating pregnancy, second trimester: Secondary | ICD-10-CM | POA: Diagnosis not present

## 2020-02-04 DIAGNOSIS — O418X20 Other specified disorders of amniotic fluid and membranes, second trimester, not applicable or unspecified: Secondary | ICD-10-CM

## 2020-02-04 DIAGNOSIS — Z3402 Encounter for supervision of normal first pregnancy, second trimester: Secondary | ICD-10-CM

## 2020-02-04 DIAGNOSIS — F17210 Nicotine dependence, cigarettes, uncomplicated: Secondary | ICD-10-CM | POA: Diagnosis not present

## 2020-02-04 DIAGNOSIS — Z8616 Personal history of COVID-19: Secondary | ICD-10-CM | POA: Insufficient documentation

## 2020-02-04 LAB — URINALYSIS, COMPLETE (UACMP) WITH MICROSCOPIC
Bilirubin Urine: NEGATIVE
Glucose, UA: NEGATIVE mg/dL
Ketones, ur: 5 mg/dL — AB
Leukocytes,Ua: NEGATIVE
Nitrite: NEGATIVE
Protein, ur: NEGATIVE mg/dL
Specific Gravity, Urine: 1.014 (ref 1.005–1.030)
pH: 6 (ref 5.0–8.0)

## 2020-02-04 LAB — POCT URINALYSIS DIPSTICK OB
Bilirubin, UA: NEGATIVE
Blood, UA: NEGATIVE
Glucose, UA: NEGATIVE
Ketones, UA: NEGATIVE
Leukocytes, UA: NEGATIVE
Nitrite, UA: NEGATIVE
POC,PROTEIN,UA: NEGATIVE
Spec Grav, UA: <=1.005 — AB (ref 1.010–1.025)
Urobilinogen, UA: 0.2 E.U./dL
pH, UA: 5 (ref 5.0–8.0)

## 2020-02-04 LAB — COMPREHENSIVE METABOLIC PANEL
ALT: 15 U/L (ref 0–44)
AST: 17 U/L (ref 15–41)
Albumin: 3.6 g/dL (ref 3.5–5.0)
Alkaline Phosphatase: 64 U/L (ref 38–126)
Anion gap: 11 (ref 5–15)
BUN: 5 mg/dL — ABNORMAL LOW (ref 6–20)
CO2: 22 mmol/L (ref 22–32)
Calcium: 8.5 mg/dL — ABNORMAL LOW (ref 8.9–10.3)
Chloride: 103 mmol/L (ref 98–111)
Creatinine, Ser: 0.55 mg/dL (ref 0.44–1.00)
GFR calc non Af Amer: >60 mL/min (ref >60)
Glucose, Bld: 86 mg/dL (ref 70–99)
Potassium: 3.5 mmol/L (ref 3.5–5.1)
Sodium: 136 mmol/L (ref 135–145)
Total Bilirubin: 0.6 mg/dL (ref 0.3–1.2)
Total Protein: 6.7 g/dL (ref 6.5–8.1)

## 2020-02-04 LAB — CBC
HCT: 35.6 % — ABNORMAL LOW (ref 36.0–46.0)
Hemoglobin: 12.4 g/dL (ref 12.0–15.0)
MCH: 31.2 pg (ref 26.0–34.0)
MCHC: 34.8 g/dL (ref 30.0–36.0)
MCV: 89.4 fL (ref 80.0–100.0)
Platelets: 298 10*3/uL (ref 150–400)
RBC: 3.98 MIL/uL (ref 3.87–5.11)
RDW: 12.7 % (ref 11.5–15.5)
WBC: 12.2 10*3/uL — ABNORMAL HIGH (ref 4.0–10.5)
nRBC: 0 % (ref 0.0–0.2)

## 2020-02-04 LAB — HCG, QUANTITATIVE, PREGNANCY: hCG, Beta Chain, Quant, S: 16774 m[IU]/mL — ABNORMAL HIGH (ref <5)

## 2020-02-04 NOTE — ED Notes (Signed)
DC reviewed by RN and provider. Pt ambulated out of ED with boyfriend. AO x4

## 2020-02-04 NOTE — Discharge Instructions (Addendum)
Do not carry anything more than 5 pounds.  Do a complete pelvic rest with no sex, no tampons until 48 hours of no bleeding.  Call your OB in the morning for close follow-up.  Return to the emergency room if you are having a pretty severe bleeding or abdominal pain.

## 2020-02-04 NOTE — Progress Notes (Signed)
ROB doing well. Not feeling movement yet. Having round ligament pain , reviewed self help measures. Had some brown spotting . Reviewed common causes . Reassurance given. Discussed red flag symptoms . Discussed anatomy u/s next visit. Follow up 4 wks with Marcelino Duster .   Doreene Burke, CNM

## 2020-02-04 NOTE — Patient Instructions (Signed)

## 2020-02-04 NOTE — ED Notes (Signed)
Assumed care of pt upon being roomed. Reports 4/10 cramping with intermittent worsening in intensity of pain. G4P2, states previous full term pregnancies were without complications. Pt reports she bled through one panty liner and 1 pad since 1830, now on 3rd pad. States quarter size clots pass when using the toilet.

## 2020-02-04 NOTE — ED Notes (Addendum)
G3P2, pt at [facility]; Pt noted to be O+ in chart

## 2020-02-04 NOTE — ED Provider Notes (Signed)
[facility] Emergency Department Provider Note  ____________________________________________  Time seen: Approximately 11:43 PM  I have reviewed the triage vital signs and the nursing notes.   HISTORY  Chief Complaint Vaginal Bleeding and Abdominal Cramping   HPI Veronica Harmon is a 29 y.o. female presents for evaluation of vaginal bleeding.  Patient is currently at [redacted] weeks gestational age.  This is her third pregnancy.  No prior complications in the past.  No history of bleeding disorder.  She went to see her OB earlier today for regular prenatal visit.  No pelvic swabs or pelvic exam was done at the OB's office  today.  She said about an hour after getting home she started having cramping and nausea.  She went to the bathroom and had a big gush of blood with some blood clots in the toilet.  At this time she reports very minimal cramping in the lower part of her abdomen.  No cough, no chest pain or shortness of breath, no fever or chills, no dysuria or hematuria.  Patient has already undergone her prenatal care screening.  Past Medical History:  Diagnosis Date  . Anxiety   . Chlamydia infection   . Chronic neck pain   . COVID-19 04/2019  . Depression     Patient Active Problem List   Diagnosis Date Noted  . Smoker 01/12/2020  . Type O blood, Rh positive 01/10/2020  . Family history of uterine fibroid 01/22/2019  . History of ovarian cyst 01/22/2019  . Cervical herniated disc 06/28/2018  . Anxiety 12/10/2015    Past Surgical History:  Procedure Laterality Date  . CERVICAL DISC ARTHROPLASTY N/A 06/28/2018   Procedure: Cervical five-six Artificial disc replacement;  Surgeon: Tressie Stalker, MD;  Location: [facility] OR;  Service: Neurosurgery;  Laterality: N/A;  Cervical five-six Artificial disc replacement  . WISDOM TOOTH EXTRACTION      Prior to Admission medications   Medication Sig Start Date End Date Taking? Authorizing Provider  Prenatal Vit-Fe  Fumarate-FA (MULTIVITAMIN-PRENATAL) 27-0.8 MG TABS tablet Take 1 tablet by mouth daily at 12 noon.    [provider]    Allergies Allyl isothiocyanate, Sulfa antibiotics, and Vicodin [hydrocodone-acetaminophen]  Family History  Problem Relation Age of Onset  . Cancer Paternal Aunt        breast  . Healthy Mother   . Healthy Father   . Ovarian cancer Neg Hx   . Colon cancer Neg Hx     Social History Social History   Tobacco Use  . Smoking status: Current Every Day Smoker    Packs/day: 0.50    Types: Cigarettes  . Smokeless tobacco: Never Used  Vaping Use  . Vaping Use: Former  Substance Use Topics  . Alcohol use: Not Currently  . Drug use: No    Review of Systems  Constitutional: Negative for fever. Eyes: Negative for visual changes. ENT: Negative for sore throat. Neck: No neck pain  Cardiovascular: Negative for chest pain. Respiratory: Negative for shortness of breath. Gastrointestinal: Negative for abdominal pain, vomiting or diarrhea. Genitourinary: Negative for dysuria. + vaginal bleeding and cramping Musculoskeletal: Negative for back pain. Skin: Negative for rash. Neurological: Negative for headaches, weakness or numbness. Psych: No SI or HI  ____________________________________________   PHYSICAL EXAM:  VITAL SIGNS: ED Triage Vitals  Enc Vitals Group     BP 02/04/20 1935 (!) 112/55     Pulse Rate 02/04/20 1935 91     Resp 02/04/20 1935 16  Temp 02/04/20 1935 99.1 F (37.3 C)     Temp Source 02/04/20 1935 Oral     SpO2 02/04/20 1935 100 %     Weight 02/04/20 1936 169 lb (76.7 kg)     Height 02/04/20 1936 5\' 6"  (1.676 m)     Head Circumference --      Peak Flow --      Pain Score 02/04/20 1936 3     Pain Loc --      Pain Edu? --      Excl. in GC? --     Constitutional: Alert and oriented. Well appearing and in no apparent distress. HEENT:      Head: Normocephalic and atraumatic.         Eyes: Conjunctivae are normal. Sclera is  non-icteric.       Mouth/Throat: Mucous membranes are moist.       Neck: Supple with no signs of meningismus. Cardiovascular: Regular rate and rhythm.  Respiratory: Normal respiratory effort.  Gastrointestinal: Soft, non tender, and non distended. Musculoskeletal: No edema, cyanosis, or erythema of extremities. Neurologic: Normal speech and language. Face is symmetric. Moving all extremities. No gross focal neurologic deficits are appreciated. Skin: Skin is warm, dry and intact. No rash noted. Psychiatric: Mood and affect are normal. Speech and behavior are normal.  ____________________________________________   LABS (all labs ordered are listed, but only abnormal results are displayed)  Labs Reviewed  COMPREHENSIVE METABOLIC PANEL - Abnormal; Notable for the following components:      Result Value   BUN 5 (*)    Calcium 8.5 (*)    All other components within normal limits  CBC - Abnormal; Notable for the following components:   WBC 12.2 (*)    HCT 35.6 (*)    All other components within normal limits  URINALYSIS, COMPLETE (UACMP) WITH MICROSCOPIC - Abnormal; Notable for the following components:   Color, Urine YELLOW (*)    APPearance HAZY (*)    Hgb urine dipstick LARGE (*)    Ketones, ur 5 (*)    Bacteria, UA RARE (*)    All other components within normal limits  HCG, QUANTITATIVE, PREGNANCY - Abnormal; Notable for the following components:   hCG, Beta Chain, Quant, S 04/05/20 (*)    All other components within normal limits   ____________________________________________  EKG  none  ____________________________________________  RADIOLOGY  I have personally reviewed the images performed during this visit and I agree with the Radiologist's read.   Interpretation by Radiologist:  18,299 OB Limited  Result Date: 02/04/2020 CLINICAL DATA:  Bleeding for 3-4 EXAM: LIMITED OBSTETRIC ULTRASOUND FINDINGS: Number of Fetuses: 1 Heart Rate:  144 bpm Movement: Yes Presentation:  Breech Placental Location: Anterior Previa: No Amniotic Fluid (Subjective):  Within normal limits. BPD: 3.0 cm 15 w  4 d MATERNAL FINDINGS: Cervix:  Appears closed. Uterus/Adnexae: Possible subchorionic hemorrhage is noted. The ovaries are normal in appearance. IMPRESSION: Single live intrauterine pregnancy measuring 15 weeks 4 days. Possible small subchorionic hemorrhage. This exam is performed on an emergent basis and does not comprehensively evaluate fetal size, dating, or anatomy; follow-up complete OB 04/05/2020 should be considered if further fetal assessment is warranted. Electronically Signed   By: [place] M.D.   On: 02/04/2020 23:07     ____________________________________________   PROCEDURES  Procedure(s) performed: None Procedures Critical Care performed:  None ____________________________________________   INITIAL IMPRESSION / ASSESSMENT AND PLAN / ED COURSE   29 y.o. female currently at [redacted] weeks  gestational age on her third pregnancy who presents for evaluation of vaginal bleeding.  Patient is hemodynamically stable, abdomen is soft with no significant tenderness.  Blood type O+ no indication for RhoGam.  Hemoglobin stable at 12.4.  Ultrasound visualized by me showing a normal IUP with good fetal movement and normal fetal heart rate, confirmed by radiology.  There is a small subchorionic hemorrhage present as well.  Discussed these findings with patient.  Discussed that this could be a threatened miscarriage and recommended pelvic rest up to 48 hours without any bleeding.  Recommended close follow-up with OB/GYN.  Discussed my return precautions and signs of acute blood loss anemia.  History gathered from patient and her husband.  Plan discussed with both of them.  Old medical records reviewed.      _____________________________________________ Please note:  Patient was evaluated in Emergency Department today for the symptoms described in the history of present illness. Patient was  evaluated in the context of the global COVID-19 pandemic, which necessitated consideration that the patient might be at risk for infection with the SARS-CoV-2 virus that causes COVID-19. Institutional protocols and algorithms that pertain to the evaluation of patients at risk for COVID-19 are in a state of rapid change based on information released by regulatory bodies including the CDC and federal and state organizations. These policies and algorithms were followed during the patient's care in the ED.  Some ED evaluations and interventions may be delayed as a result of limited staffing during the pandemic.   [place] Controlled Substance Database was reviewed by me. ____________________________________________   FINAL CLINICAL IMPRESSION(S) / ED DIAGNOSES   Final diagnoses:  Threatened miscarriage  Subchorionic hemorrhage of placenta in second trimester, single or unspecified fetus      NEW MEDICATIONS STARTED DURING THIS VISIT:  ED Discharge Orders    None       Note:  This document was prepared using Dragon voice recognition software and may include unintentional dictation errors.    Don Perking, [place], MD 02/04/20 [phone]

## 2020-02-04 NOTE — ED Triage Notes (Signed)
PT to ED via POV from home. PT had normal OB appt this morning but bleeding and cramping starting 1hr ago. PT states bright red blood with clots in toilet. VSS.

## 2020-02-05 ENCOUNTER — Encounter: Payer: Self-pay | Admitting: Certified Nurse Midwife

## 2020-02-05 ENCOUNTER — Ambulatory Visit (INDEPENDENT_AMBULATORY_CARE_PROVIDER_SITE_OTHER): Payer: Medicaid Other | Admitting: Certified Nurse Midwife

## 2020-02-05 VITALS — BP 108/71 | HR 99 | Wt 169.3 lb

## 2020-02-05 DIAGNOSIS — Z3A14 14 weeks gestation of pregnancy: Secondary | ICD-10-CM

## 2020-02-05 DIAGNOSIS — IMO0001 Reserved for inherently not codable concepts without codable children: Secondary | ICD-10-CM

## 2020-02-05 DIAGNOSIS — O468X2 Other antepartum hemorrhage, second trimester: Secondary | ICD-10-CM

## 2020-02-05 DIAGNOSIS — O418X20 Other specified disorders of amniotic fluid and membranes, second trimester, not applicable or unspecified: Secondary | ICD-10-CM

## 2020-02-05 LAB — POCT URINALYSIS DIPSTICK OB
Bilirubin, UA: NEGATIVE
Blood, UA: NEGATIVE
Glucose, UA: NEGATIVE
Ketones, UA: NEGATIVE
Leukocytes, UA: NEGATIVE
Nitrite, UA: NEGATIVE
POC,PROTEIN,UA: NEGATIVE
Spec Grav, UA: 1.01 (ref 1.010–1.025)
Urobilinogen, UA: 0.2 E.U./dL
pH, UA: 5 (ref 5.0–8.0)

## 2020-02-05 NOTE — Patient Instructions (Signed)

## 2020-02-05 NOTE — Addendum Note (Signed)
Addended by: Brooke Dare on: 02/05/2020 11:14 AM   Modules accepted: Orders

## 2020-02-05 NOTE — Progress Notes (Signed)
Pt presents for follow up for ED, was seen yesterday for cramping and bleeding u/s shows Uterus/Adnexae: Possible subchorionic hemorrhage is noted. The ovaries are normal in appearance.  Reviewed with pt, written information given. Reassurance given Encouraged pelvic rest. Redflag symptoms reviewed. Follow up as scheduled.   Doreene Burke, CNM

## 2020-03-05 ENCOUNTER — Ambulatory Visit (INDEPENDENT_AMBULATORY_CARE_PROVIDER_SITE_OTHER): Payer: Medicaid Other

## 2020-03-05 ENCOUNTER — Ambulatory Visit (INDEPENDENT_AMBULATORY_CARE_PROVIDER_SITE_OTHER): Payer: Medicaid Other | Admitting: Certified Nurse Midwife

## 2020-03-05 ENCOUNTER — Other Ambulatory Visit: Payer: Self-pay

## 2020-03-05 ENCOUNTER — Other Ambulatory Visit: Payer: Medicaid Other

## 2020-03-05 VITALS — BP 97/62 | HR 87 | Wt 173.0 lb

## 2020-03-05 DIAGNOSIS — Z3402 Encounter for supervision of normal first pregnancy, second trimester: Secondary | ICD-10-CM

## 2020-03-05 DIAGNOSIS — Z3482 Encounter for supervision of other normal pregnancy, second trimester: Secondary | ICD-10-CM

## 2020-03-05 DIAGNOSIS — Z3A19 19 weeks gestation of pregnancy: Secondary | ICD-10-CM

## 2020-03-05 DIAGNOSIS — Z3689 Encounter for other specified antenatal screening: Secondary | ICD-10-CM

## 2020-03-05 LAB — POCT URINALYSIS DIPSTICK OB
Bilirubin, UA: NEGATIVE
Blood, UA: NEGATIVE
Glucose, UA: NEGATIVE
Ketones, UA: NEGATIVE
Leukocytes, UA: NEGATIVE
Nitrite, UA: NEGATIVE
POC,PROTEIN,UA: NEGATIVE
Spec Grav, UA: 1.015 (ref 1.010–1.025)
Urobilinogen, UA: 0.2 E.U./dL
pH, UA: 5 (ref 5.0–8.0)

## 2020-03-05 MED ORDER — BUTALBITAL-APAP-CAFFEINE 50-325-40 MG PO CAPS: 2.0000 | ORAL_CAPSULE | Freq: Four times a day (QID) | ORAL | 0 refills | Status: DC | PRN

## 2020-03-05 MED ORDER — MAGNESIUM OXIDE 400 (241.3 MG) MG PO TABS
400.0000 mg | ORAL_TABLET | Freq: Every day | ORAL | 3 refills | Status: DC
Start: 2020-03-05 — End: 2020-03-15

## 2020-03-05 NOTE — Progress Notes (Signed)
ROB-Reports frequent migraine headaches, requiring Tylenol. Discussed home treatment measures. Rx Magnesium oxide and Fioricet, see orders. Anatomy scan today normal yet incomplete, see below. Anticipatory guidance regarding course prenatal care. Reviewed red flag symptoms and when to call. RTC x 2 weeks for follow up ultrasound. RTC x 4 weeks for ROB or sooner if needed.   ULTRASOUND REPORT  Location: [facility] Date of Service: 03/05/2020   Indications:Anatomy Ultrasound Findings:  Singleton intrauterine pregnancy is visualized with FHR at 173 BPM. Biometrics give an (U/S) Gestational age of [redacted]w[redacted]d and an (U/S) EDD of 07/30/2020; this correlates with the clinically established Estimated Date of Delivery: 07/30/20  Fetal presentation is transverse.  EFW: 269 g ( 9 oz). Fetal Percentile  Placenta: posterior. Grade: 1 AFI: subjectively normal.  Anatomic survey is incomplete for Fetal heart, profile and normal; Gender - female.    Right Ovary is normal in appearance. Left Ovary is normal appearance. Survey of the adnexa demonstrates no adnexal masses. There is no free peritoneal fluid in the cul de sac.  Impression: 1. [redacted]w[redacted]d Viable Singleton Intrauterine pregnancy by U/S. 2. (U/S) EDD is consistent with Clinically established Estimated Date of Delivery: 07/30/20 . 3. Incomplete for heart and profile.  Recommendations: 1.Clinical correlation with the patient's History and Physical Exam.

## 2020-03-05 NOTE — Patient Instructions (Addendum)
Second Trimester of Pregnancy  The second trimester is from week 14 through week 27 (month 4 through 6). This is often the time in pregnancy that you feel your best. Often times, morning sickness has lessened or quit. You may have more energy, and you may get hungry more often. Your unborn baby is growing rapidly. At the end of the sixth month, he or she is about 9 inches long and weighs about 1 pounds. You will likely feel the baby move between 18 and 20 weeks of pregnancy. Follow these instructions at home: Medicines  Take over-the-counter and prescription medicines only as told by your doctor. Some medicines are safe and some medicines are not safe during pregnancy.  Take a prenatal vitamin that contains at least 600 micrograms (mcg) of folic acid.  If you have trouble pooping (constipation), take medicine that will make your stool soft (stool softener) if your doctor approves. Eating and drinking   Eat regular, healthy meals.  Avoid raw meat and uncooked cheese.  If you get low calcium from the food you eat, talk to your doctor about taking a daily calcium supplement.  Avoid foods that are high in fat and sugars, such as fried and sweet foods.  If you feel sick to your stomach (nauseous) or throw up (vomit): ? Eat 4 or 5 small meals a day instead of 3 large meals. ? Try eating a few soda crackers. ? Drink liquids between meals instead of during meals.  To prevent constipation: ? Eat foods that are high in fiber, like fresh fruits and vegetables, whole grains, and beans. ? Drink enough fluids to keep your pee (urine) clear or pale yellow. Activity  Exercise only as told by your doctor. Stop exercising if you start to have cramps.  Do not exercise if it is too hot, too humid, or if you are in a place of great height (high altitude).  Avoid heavy lifting.  Wear low-heeled shoes. Sit and stand up straight.  You can continue to have sex unless your doctor tells you not  to. Relieving pain and discomfort  Wear a good support bra if your breasts are tender.  Take warm water baths (sitz baths) to soothe pain or discomfort caused by hemorrhoids. Use hemorrhoid cream if your doctor approves.  Rest with your legs raised if you have leg cramps or low back pain.  If you develop puffy, bulging veins (varicose veins) in your legs: ? Wear support hose or compression stockings as told by your doctor. ? Raise (elevate) your feet for 15 minutes, 3-4 times a day. ? Limit salt in your food. Prenatal care  Write down your questions. Take them to your prenatal visits.  Keep all your prenatal visits as told by your doctor. This is important. Safety  Wear your seat belt when driving.  Make a list of emergency phone numbers, including numbers for family, friends, the hospital, and police and fire departments. General instructions  Ask your doctor about the right foods to eat or for help finding a counselor, if you need these services.  Ask your doctor about local prenatal classes. Begin classes before month 6 of your pregnancy.  Do not use hot tubs, steam rooms, or saunas.  Do not douche or use tampons or scented sanitary pads.  Do not cross your legs for long periods of time.  Visit your dentist if you have not done so. Use a soft toothbrush to brush your teeth. Floss gently.  Avoid all smoking, herbs,   and alcohol. Avoid drugs that are not approved by your doctor.  Do not use any products that contain nicotine or tobacco, such as cigarettes and e-cigarettes. If you need help quitting, ask your doctor.  Avoid cat litter boxes and soil used by cats. These carry germs that can cause birth defects in the baby and can cause a loss of your baby (miscarriage) or stillbirth. Contact a doctor if:  You have mild cramps or pressure in your lower belly.  You have pain when you pee (urinate).  You have bad smelling fluid coming from your vagina.  You continue to  feel sick to your stomach (nauseous), throw up (vomit), or have watery poop (diarrhea).  You have a nagging pain in your belly area.  You feel dizzy. Get help right away if:  You have a fever.  You are leaking fluid from your vagina.  You have spotting or bleeding from your vagina.  You have severe belly cramping or pain.  You lose or gain weight rapidly.  You have trouble catching your breath and have chest pain.  You notice sudden or extreme puffiness (swelling) of your face, hands, ankles, feet, or legs.  You have not felt the baby move in over an hour.  You have severe headaches that do not go away when you take medicine.  You have trouble seeing. Summary  The second trimester is from week 14 through week 27 (months 4 through 6). This is often the time in pregnancy that you feel your best.  To take care of yourself and your unborn baby, you will need to eat healthy meals, take medicines only if your doctor tells you to do so, and do activities that are safe for you and your baby.  Call your doctor if you get sick or if you notice anything unusual about your pregnancy. Also, call your doctor if you need help with the right food to eat, or if you want to know what activities are safe for you. This information is not intended to replace advice given to you by your health care provider. Make sure you discuss any questions you have with your health care provider. Document Revised: 08/10/2018 Document Reviewed: 05/24/2016 Elsevier Patient Education  2020 Elsevier Inc.  Acetaminophen; Butalbital; Caffeine tablets or capsules What is this medicine? ACETAMINOPHEN; BUTALBITAL; CAFFEINE (a set a MEE noe fen; byoo TAL bi tal; KAF een) is a pain reliever. It is used to treat tension headaches. This medicine may be used for other purposes; ask your health care provider or pharmacist if you have questions. COMMON BRAND NAME(S): Alagesic, Americet, Anolor-300, Arcet, BAC, CAPACET,  Dolgic Plus, Esgic, Esgic Plus, Ezol, Fioricet, Ryder System, Medigesic, [place], [address] [place], Phrenilin Forte, Repan, [place], Triad, Zebutal What should I tell my health care provider before I take this medicine? They need to know if you have any of these conditions:  drug abuse or addiction  heart or circulation problems  if you often drink alcohol  kidney disease or problems going to the bathroom  liver disease  lung disease, asthma, or breathing problems  porphyria  an unusual or allergic reaction to acetaminophen, butalbital or other barbiturates, caffeine, other medicines, foods, dyes, or preservatives  pregnant or trying to get pregnant  breast-feeding How should I use this medicine? Take this medicine by mouth with a full glass of water. Follow the directions on the prescription label. If the medicine upsets your stomach, take the medicine with food or milk. Do not take more than  you are told to take. Talk to your pediatrician regarding the use of this medicine in children. Special care may be needed. Overdosage: If you think you have taken too much of this medicine contact a poison control center or emergency room at once. NOTE: This medicine is only for you. Do not share this medicine with others. What if I miss a dose? If you miss a dose, take it as soon as you can. If it is almost time for your next dose, take only that dose. Do not take double or extra doses. What may interact with this medicine?  alcohol or medicines that contain alcohol  antidepressants, especially MAOIs like isocarboxazid, phenelzine, tranylcypromine, and selegiline  antihistamines  benzodiazepines  carbamazepine  isoniazid  medicines for pain like pentazocine, buprenorphine, butorphanol, nalbuphine, tramadol, and propoxyphene  muscle relaxants  naltrexone  phenobarbital, phenytoin, and fosphenytoin  phenothiazines like perphenazine, thioridazine, chlorpromazine, mesoridazine,  fluphenazine, prochlorperazine, promazine, and trifluoperazine  voriconazole This list may not describe all possible interactions. Give your health care provider a list of all the medicines, herbs, non-prescription drugs, or dietary supplements you use. Also tell them if you smoke, drink alcohol, or use illegal drugs. Some items may interact with your medicine. What should I watch for while using this medicine? Tell your doctor or health care professional if your pain does not go away, if it gets worse, or if you have new or a different type of pain. You may develop tolerance to the medicine. Tolerance means that you will need a higher dose of the medicine for pain relief. Tolerance is normal and is expected if you take the medicine for a long time. Do not suddenly stop taking your medicine because you may develop a severe reaction. Your body becomes used to the medicine. This does NOT mean you are addicted. Addiction is a behavior related to getting and using a drug for a non-medical reason. If you have pain, you have a medical reason to take pain medicine. Your doctor will tell you how much medicine to take. If your doctor wants you to stop the medicine, the dose will be slowly lowered over time to avoid any side effects. You may get drowsy or dizzy when you first start taking the medicine or change doses. Do not drive, use machinery, or do anything that may be dangerous until you know how the medicine affects you. Stand or sit up slowly. Do not take other medicines that contain acetaminophen with this medicine. Always read labels carefully. If you have questions, ask your doctor or pharmacist. If you take too much acetaminophen get medical help right away. Too much acetaminophen can be very dangerous and cause liver damage. Even if you do not have symptoms, it is important to get help right away. What side effects may I notice from receiving this medicine? Side effects that you should report to your  doctor or health care professional as soon as possible:  allergic reactions like skin rash, itching or hives, swelling of the face, lips, or tongue  breathing problems  confusion  feeling faint or lightheaded, falls  redness, blistering, peeling or loosening of the skin, including inside the mouth  seizure  stomach pain  yellowing of the eyes or skin Side effects that usually do not require medical attention (report to your doctor or health care professional if they continue or are bothersome):  constipation  nausea, vomiting This list may not describe all possible side effects. Call your doctor for medical advice about side  effects. You may report side effects to FDA at 1-800-FDA-1088. Where should I keep my medicine? Keep out of the reach of children. This medicine can be abused. Keep your medicine in a safe place to protect it from theft. Do not share this medicine with anyone. Selling or giving away this medicine is dangerous and against the law. This medicine may cause accidental overdose and death if it taken by other adults, children, or pets. Mix any unused medicine with a substance like cat litter or coffee grounds. Then throw the medicine away in a sealed container like a sealed bag or a coffee can with a lid. Do not use the medicine after the expiration date. Store at room temperature between 15 and 30 degrees C (59 and 86 degrees F). NOTE: This sheet is a summary. It may not cover all possible information. If you have questions about this medicine, talk to your doctor, pharmacist, or health care provider.  2020 Elsevier/Gold Standard (2013-06-14 15:00:25)

## 2020-03-15 ENCOUNTER — Emergency Department
Admission: EM | Admit: 2020-03-15 | Discharge: 2020-03-15 | Disposition: A | Discharge status: Home / Self Care | Payer: Medicaid Other | Attending: Student in an Organized Health Care Education/Training Program | Admitting: Student in an Organized Health Care Education/Training Program

## 2020-03-15 ENCOUNTER — Other Ambulatory Visit: Payer: Self-pay

## 2020-03-15 ENCOUNTER — Encounter: Payer: Self-pay | Admitting: Emergency Medicine

## 2020-03-15 DIAGNOSIS — L0231 Cutaneous abscess of buttock: Secondary | ICD-10-CM | POA: Diagnosis present

## 2020-03-15 DIAGNOSIS — F17210 Nicotine dependence, cigarettes, uncomplicated: Secondary | ICD-10-CM | POA: Diagnosis not present

## 2020-03-15 DIAGNOSIS — Z8616 Personal history of COVID-19: Secondary | ICD-10-CM | POA: Insufficient documentation

## 2020-03-15 LAB — COMPREHENSIVE METABOLIC PANEL
ALT: 13 U/L (ref 0–44)
AST: 14 U/L — ABNORMAL LOW (ref 15–41)
Albumin: 3.4 g/dL — ABNORMAL LOW (ref 3.5–5.0)
Alkaline Phosphatase: 78 U/L (ref 38–126)
Anion gap: 8 (ref 5–15)
BUN: <5 mg/dL — ABNORMAL LOW (ref 6–20)
CO2: 23 mmol/L (ref 22–32)
Calcium: 8.6 mg/dL — ABNORMAL LOW (ref 8.9–10.3)
Chloride: 108 mmol/L (ref 98–111)
Creatinine, Ser: 0.53 mg/dL (ref 0.44–1.00)
GFR, Estimated: >60 mL/min (ref >60)
Glucose, Bld: 86 mg/dL (ref 70–99)
Potassium: 3.6 mmol/L (ref 3.5–5.1)
Sodium: 139 mmol/L (ref 135–145)
Total Bilirubin: 0.7 mg/dL (ref 0.3–1.2)
Total Protein: 6.7 g/dL (ref 6.5–8.1)

## 2020-03-15 LAB — CBC
HCT: 35.1 % — ABNORMAL LOW (ref 36.0–46.0)
Hemoglobin: 11.9 g/dL — ABNORMAL LOW (ref 12.0–15.0)
MCH: 31.4 pg (ref 26.0–34.0)
MCHC: 33.9 g/dL (ref 30.0–36.0)
MCV: 92.6 fL (ref 80.0–100.0)
Platelets: 300 10*3/uL (ref 150–400)
RBC: 3.79 MIL/uL — ABNORMAL LOW (ref 3.87–5.11)
RDW: 12.6 % (ref 11.5–15.5)
WBC: 14.7 10*3/uL — ABNORMAL HIGH (ref 4.0–10.5)
nRBC: 0 % (ref 0.0–0.2)

## 2020-03-15 MED ORDER — LIDOCAINE-EPINEPHRINE (PF) 2 %-1:200000 IJ SOLN
10.0000 mL | Freq: Once | INTRAMUSCULAR | Status: DC
  Filled 2020-03-15: qty 10

## 2020-03-15 MED ORDER — ONDANSETRON HCL 4 MG/2ML IJ SOLN
4.0000 mg | Freq: Once | INTRAMUSCULAR | Status: AC
  Administered 2020-03-15: 4 mg via INTRAVENOUS
  Filled 2020-03-15: qty 2

## 2020-03-15 MED ORDER — LIDOCAINE 4 % EX CREA
TOPICAL_CREAM | Freq: Once | CUTANEOUS | Status: AC
  Administered 2020-03-15: 1 via TOPICAL
  Filled 2020-03-15: qty 5

## 2020-03-15 MED ORDER — LIDOCAINE-EPINEPHRINE (PF) 2 %-1:200000 IJ SOLN
20.0000 mL | Freq: Once | INTRAMUSCULAR | Status: AC
  Administered 2020-03-15: 20 mL
  Filled 2020-03-15: qty 20

## 2020-03-15 MED ORDER — FENTANYL CITRATE (PF) 100 MCG/2ML IJ SOLN
75.0000 ug | Freq: Once | INTRAMUSCULAR | Status: AC
  Administered 2020-03-15: 75 ug via INTRAVENOUS
  Filled 2020-03-15: qty 2

## 2020-03-15 MED ORDER — SODIUM CHLORIDE 0.9 % IV BOLUS
1000.0000 mL | Freq: Once | INTRAVENOUS | Status: AC
  Administered 2020-03-15: 1000 mL via INTRAVENOUS

## 2020-03-15 NOTE — ED Provider Notes (Signed)
[facility] Emergency Department Provider Note    First MD Initiated Contact with Patient 03/15/20 1020     (approximate)  I have reviewed the triage vital signs and the nursing notes.   HISTORY  Chief Complaint Abscess    HPI Veronica Harmon is a 29 y.o. female is roughly [redacted] weeks pregnant presents to the ER for evaluation of worsening abscess in her right upper buttock.  States she has been having chills but no measured fever.  Took Tylenol this morning around 7:00.  Was seen in urgent care yesterday and was told that they did not feel comfortable draining the abscess and was placed on antibiotics.  No other significant past medical history.    Past Medical History:  Diagnosis Date  . Anxiety   . Chlamydia infection   . Chronic neck pain   . COVID-19 04/2019  . Depression    Family History  Problem Relation Age of Onset  . Cancer Paternal Aunt        breast  . Healthy Mother   . Healthy Father   . Ovarian cancer Neg Hx   . Colon cancer Neg Hx    Past Surgical History:  Procedure Laterality Date  . CERVICAL DISC ARTHROPLASTY N/A 06/28/2018   Procedure: Cervical five-six Artificial disc replacement;  Surgeon: Tressie Stalker, MD;  Location: [facility] OR;  Service: Neurosurgery;  Laterality: N/A;  Cervical five-six Artificial disc replacement  . WISDOM TOOTH EXTRACTION     Patient Active Problem List   Diagnosis Date Noted  . Smoker 01/12/2020  . Type O blood, Rh positive 01/10/2020  . Family history of uterine fibroid 01/22/2019  . History of ovarian cyst 01/22/2019  . Cervical herniated disc 06/28/2018  . Anxiety 12/10/2015      Prior to Admission medications   Medication Sig Start Date End Date Taking? Authorizing Provider  acetaminophen (TYLENOL) 500 MG tablet Take 500-1,000 mg by mouth every 6 (six) hours as needed for mild pain or fever.   Yes [provider]  Butalbital-APAP-Caffeine 50-325-40 MG capsule Take 2 capsules by  mouth every 6 (six) hours as needed for headache. 03/05/20  Yes Lawhorn, Vanessa [place], CNM  clindamycin (CLEOCIN) 300 MG capsule Take 300 mg by mouth 2 (two) times daily. 03/14/20 03/21/20 Yes [provider]  ondansetron (ZOFRAN-ODT) 4 MG disintegrating tablet Take 4 mg by mouth every 8 (eight) hours as needed for nausea or vomiting.   Yes [provider]  Prenatal Vit-Fe Fumarate-FA (MULTIVITAMIN-PRENATAL) 27-0.8 MG TABS tablet Take 1 tablet by mouth daily.    Yes [provider]    Allergies Allyl isothiocyanate, Sulfa antibiotics, and Vicodin [hydrocodone-acetaminophen]    Social History Social History   Tobacco Use  . Smoking status: Current Every Day Smoker    Packs/day: 0.50    Types: Cigarettes  . Smokeless tobacco: Never Used  Vaping Use  . Vaping Use: Former  Substance Use Topics  . Alcohol use: Not Currently  . Drug use: No    Review of Systems Patient denies headaches, rhinorrhea, blurry vision, numbness, shortness of breath, chest pain, edema, cough, abdominal pain, nausea, vomiting, diarrhea, dysuria, fevers, rashes or hallucinations unless otherwise stated above in HPI. ____________________________________________   PHYSICAL EXAM:  VITAL SIGNS: Vitals:   03/15/20 1230 03/15/20 1257  BP: (!) 105/54 106/75  Pulse: 94 90  Resp:  16  Temp:  98.6 F (37 C)  SpO2: 99% 100%    Constitutional: Alert and oriented.  Eyes: Conjunctivae are normal.  Head: Atraumatic. Nose: No congestion/rhinnorhea. Mouth/Throat: Mucous membranes are moist.   Neck: No stridor. Painless ROM.  Cardiovascular: Normal rate, regular rhythm. Grossly normal heart sounds.  Good peripheral circulation. Respiratory: Normal respiratory effort.  No retractions. Lungs CTAB. Gastrointestinal: Soft and nontender. No distention. No abdominal bruits. No CVA tenderness. Genitourinary: Area of fluctuance roughly the size of a golf ball in the right upper buttock with  overlying cellulitis. Musculoskeletal: No lower extremity tenderness nor edema.  No joint effusions. Neurologic:  Normal speech and language. No gross focal neurologic deficits are appreciated. No facial droop Skin:  Skin is warm, dry and intact. No rash noted. Psychiatric: Mood and affect are normal. Speech and behavior are normal.  ____________________________________________   LABS (all labs ordered are listed, but only abnormal results are displayed)  Results for orders placed or performed during the hospital encounter of 03/15/20 (from the past 24 hour(s))  CBC     Status: Abnormal   Collection Time: 03/15/20 10:46 AM  Result Value Ref Range   WBC 14.7 (H) 4.0 - 10.5 K/uL   RBC 3.79 (L) 3.87 - 5.11 MIL/uL   Hemoglobin 11.9 (L) 12.0 - 15.0 g/dL   HCT 68.1 (L) 36 - 46 %   MCV 92.6 80.0 - 100.0 fL   MCH 31.4 26.0 - 34.0 pg   MCHC 33.9 30.0 - 36.0 g/dL   RDW 15.7 26.2 - 03.5 %   Platelets 300 150 - 400 K/uL   nRBC 0.0 0.0 - 0.2 %  Comprehensive metabolic panel     Status: Abnormal   Collection Time: 03/15/20 10:46 AM  Result Value Ref Range   Sodium 139 135 - 145 mmol/L   Potassium 3.6 3.5 - 5.1 mmol/L   Chloride 108 98 - 111 mmol/L   CO2 23 22 - 32 mmol/L   Glucose, Bld 86 70 - 99 mg/dL   BUN <5 (L) 6 - 20 mg/dL   Creatinine, Ser 5.97 0.44 - 1.00 mg/dL   Calcium 8.6 (L) 8.9 - 10.3 mg/dL   Total Protein 6.7 6.5 - 8.1 g/dL   Albumin 3.4 (L) 3.5 - 5.0 g/dL   AST 14 (L) 15 - 41 U/L   ALT 13 0 - 44 U/L   Alkaline Phosphatase 78 38 - 126 U/L   Total Bilirubin 0.7 0.3 - 1.2 mg/dL   GFR, Estimated >41 >63 mL/min   Anion gap 8 5 - 15   ____________________________________________ ____________________________________________  RADIOLOGY EMERGENCY DEPARTMENT US SOFT TISSUE INTERPRETATION "Study: Limited Soft Tissue Ultrasound"  INDICATIONS: Pain Multiple views of the body part were obtained in real-time with a multi-frequency linear probe  PERFORMED BY: Myself IMAGES  ARCHIVED?: No SIDE:Right  BODY PART:buttock INTERPRETATION:  Abcess present     ____________________________________________   PROCEDURES  Procedure(s) performed:  Marland KitchenMarland KitchenIncision and Drainage  Date/Time: 03/15/2020 1:52 PM Performed by: Willy Eddy, MD Authorized by: Willy Eddy, MD   Consent:    Consent obtained:  Verbal   Consent given by:  Patient Location:    Type:  Abscess   Location:  Trunk   Trunk location: right buttock. Anesthesia (see MAR for exact dosages):    Anesthesia method:  Local infiltration   Local anesthetic:  Lidocaine 1% WITH epi Procedure type:    Complexity:  Complex Procedure details:    Incision types:  Stab incision   Incision depth:  Subcutaneous   Scalpel blade:  11   Wound management:  Probed and deloculated and  extensive cleaning   Drainage:  Purulent   Drainage amount:  Copious   Wound treatment:  Drain placed   Packing materials:  1/2 in iodoform gauze Post-procedure details:    Patient tolerance of procedure:  Tolerated well, no immediate complications      Critical Care performed: no ____________________________________________   INITIAL IMPRESSION / ASSESSMENT AND PLAN / ED COURSE  Pertinent labs & imaging results that were available during my care of the patient were reviewed by me and considered in my medical decision making (see chart for details).   DDX: Abscess, cellulitis, cyst, sepsis  WANIA LONGSTRETH is a 30 y.o. who presents to the ED with presentation as described above with evidence of right buttock abscess in the setting of secondary Mr..  Was put on Clinda yesterday after being seen in urgent care but it was not I&D did.  Representing to the ER with worsening discomfort.  Will give IV fluids.  Exam is consistent with fluctuant abscess that will require I&D.  Patient consented and agrees to procedure.  Clinical Course as of Mar 15 1353  Wynelle Link Mar 15, 2020  1232 Abscess successfully drained with large  amount of purulent fluid expressed.  Was packed with iodoform gauze.  She is otherwise well-appearing nontoxic appears appropriate for outpatient follow-up.   [PR]    Clinical Course User Index [PR] Willy Eddy, MD    The patient was evaluated in Emergency Department today for the symptoms described in the history of present illness. He/she was evaluated in the context of the global COVID-19 pandemic, which necessitated consideration that the patient might be at risk for infection with the SARS-CoV-2 virus that causes COVID-19. Institutional protocols and algorithms that pertain to the evaluation of patients at risk for COVID-19 are in a state of rapid change based on information released by regulatory bodies including the CDC and federal and state organizations. These policies and algorithms were followed during the patient's care in the ED.  As part of my medical decision making, I reviewed the following data within the electronic MEDICAL RECORD NUMBER Nursing notes reviewed and incorporated, Labs reviewed, notes from prior ED visits and [place] Controlled Substance Database   ____________________________________________   FINAL CLINICAL IMPRESSION(S) / ED DIAGNOSES  Final diagnoses:  Abscess of buttock, right      NEW MEDICATIONS STARTED DURING THIS VISIT:  Discharge Medication List as of 03/15/2020 12:51 PM       Note:  This document was prepared using Dragon voice recognition software and may include unintentional dictation errors.    Willy Eddy, MD 03/15/20 1354

## 2020-03-15 NOTE — ED Notes (Signed)
Pt request some pain meds, provider made aware.

## 2020-03-15 NOTE — ED Triage Notes (Signed)
Pt to ED via POV for abscess on her right buttock. Pt states that she was seen at urgent care yesterday and started on antibiotics. Pt reports fever and chills, pt afebrile in triage but states that she did take tylenol 1,000 mg. Pt appears to be uncomfortable at this time.

## 2020-03-16 ENCOUNTER — Telehealth: Payer: Self-pay

## 2020-03-16 MED ORDER — FLUCONAZOLE 150 MG PO TABS: 150.0000 mg | ORAL_TABLET | Freq: Once | ORAL | 1 refills | Status: AC

## 2020-03-16 NOTE — Telephone Encounter (Signed)
Called patient to see how she was doing- she states she is much better. Baby is moving non stop. States she is starting to have symptoms of a yeast infection. Rx sent to [facility]. Is taking Clindamycin 300mg  TID per ED physicans instructions. Patient states she has packing and is using tylenol around the clock.

## 2020-03-17 ENCOUNTER — Other Ambulatory Visit: Payer: Medicaid Other

## 2020-03-24 ENCOUNTER — Other Ambulatory Visit: Payer: Self-pay

## 2020-03-24 ENCOUNTER — Ambulatory Visit (INDEPENDENT_AMBULATORY_CARE_PROVIDER_SITE_OTHER): Payer: Medicaid Other

## 2020-03-24 DIAGNOSIS — Z3482 Encounter for supervision of other normal pregnancy, second trimester: Secondary | ICD-10-CM

## 2020-03-24 DIAGNOSIS — Z3689 Encounter for other specified antenatal screening: Secondary | ICD-10-CM

## 2020-03-24 DIAGNOSIS — Z3A19 19 weeks gestation of pregnancy: Secondary | ICD-10-CM | POA: Diagnosis not present

## 2020-03-31 ENCOUNTER — Other Ambulatory Visit: Payer: Self-pay | Admitting: Certified Nurse Midwife

## 2020-03-31 ENCOUNTER — Ambulatory Visit (INDEPENDENT_AMBULATORY_CARE_PROVIDER_SITE_OTHER): Payer: Medicaid Other | Admitting: Certified Nurse Midwife

## 2020-03-31 ENCOUNTER — Other Ambulatory Visit: Payer: Self-pay

## 2020-03-31 ENCOUNTER — Ambulatory Visit (INDEPENDENT_AMBULATORY_CARE_PROVIDER_SITE_OTHER): Payer: Medicaid Other

## 2020-03-31 ENCOUNTER — Encounter: Payer: Self-pay | Admitting: Certified Nurse Midwife

## 2020-03-31 VITALS — BP 109/65 | HR 102 | Wt 177.5 lb

## 2020-03-31 DIAGNOSIS — Z09 Encounter for follow-up examination after completed treatment for conditions other than malignant neoplasm: Secondary | ICD-10-CM

## 2020-03-31 DIAGNOSIS — Z3A22 22 weeks gestation of pregnancy: Secondary | ICD-10-CM

## 2020-03-31 LAB — POCT URINALYSIS DIPSTICK OB
Bilirubin, UA: NEGATIVE
Blood, UA: NEGATIVE
Glucose, UA: NEGATIVE
Ketones, UA: NEGATIVE
Leukocytes, UA: NEGATIVE
Nitrite, UA: NEGATIVE
POC,PROTEIN,UA: NEGATIVE
Spec Grav, UA: 1.01 (ref 1.010–1.025)
Urobilinogen, UA: 0.2 E.U./dL
pH, UA: 5 (ref 5.0–8.0)

## 2020-03-31 NOTE — Patient Instructions (Signed)
Glucose Tolerance Test During Pregnancy Why am I having this test? The glucose tolerance test (GTT) is done to check how your body processes sugar (glucose). This is one of several tests used to diagnose diabetes that develops during pregnancy (gestational diabetes mellitus). Gestational diabetes is a temporary form of diabetes that some women develop during pregnancy. It usually occurs during the second trimester of pregnancy and goes away after delivery. Testing (screening) for gestational diabetes usually occurs between 24 and 28 weeks of pregnancy. You may have the GTT test after having a 1-hour glucose screening test if the results from that test indicate that you may have gestational diabetes. You may also have this test if:  You have a history of gestational diabetes.  You have a history of giving birth to very large babies or have experienced repeated fetal loss (stillbirth).  You have signs and symptoms of diabetes, such as: ? Changes in your vision. ? Tingling or numbness in your hands or feet. ? Changes in hunger, thirst, and urination that are not otherwise explained by your pregnancy. What is being tested? This test measures the amount of glucose in your blood at different times during a period of 3 hours. This indicates how well your body is able to process glucose. What kind of sample is taken?  Blood samples are required for this test. They are usually collected by inserting a needle into a blood vessel. How do I prepare for this test?  For 3 days before your test, eat normally. Have plenty of carbohydrate-rich foods.  Follow instructions from your health care provider about: ? Eating or drinking restrictions on the day of the test. You may be asked to not eat or drink anything other than water (fast) starting 8-10 hours before the test. ? Changing or stopping your regular medicines. Some medicines may interfere with this test. Tell a health care provider about:  All  medicines you are taking, including vitamins, herbs, eye drops, creams, and over-the-counter medicines.  Any blood disorders you have.  Any surgeries you have had.  Any medical conditions you have. What happens during the test? First, your blood glucose will be measured. This is referred to as your fasting blood glucose, since you fasted before the test. Then, you will drink a glucose solution that contains a certain amount of glucose. Your blood glucose will be measured again 1, 2, and 3 hours after drinking the solution. This test takes about 3 hours to complete. You will need to stay at the testing location during this time. During the testing period:  Do not eat or drink anything other than the glucose solution.  Do not exercise.  Do not use any products that contain nicotine or tobacco, such as cigarettes and e-cigarettes. If you need help stopping, ask your health care provider. The testing procedure may vary among health care providers and hospitals. How are the results reported? Your results will be reported as milligrams of glucose per deciliter of blood (mg/dL) or millimoles per liter (mmol/L). Your health care provider will compare your results to normal ranges that were established after testing a large group of people (reference ranges). Reference ranges may vary among labs and hospitals. For this test, common reference ranges are:  Fasting: less than 95-105 mg/dL (5.3-5.8 mmol/L).  1 hour after drinking glucose: less than 180-190 mg/dL (10.0-10.5 mmol/L).  2 hours after drinking glucose: less than 155-165 mg/dL (8.6-9.2 mmol/L).  3 hours after drinking glucose: 140-145 mg/dL (7.8-8.1 mmol/L). What do the   results mean? Results within reference ranges are considered normal, meaning that your glucose levels are well-controlled. If two or more of your blood glucose levels are high, you may be diagnosed with gestational diabetes. If only one level is high, your health care  provider may suggest repeat testing or other tests to confirm a diagnosis. Talk with your health care provider about what your results mean. Questions to ask your health care provider Ask your health care provider, or the department that is doing the test:  When will my results be ready?  How will I get my results?  What are my treatment options?  What other tests do I need?  What are my next steps? Summary  The glucose tolerance test (GTT) is one of several tests used to diagnose diabetes that develops during pregnancy (gestational diabetes mellitus). Gestational diabetes is a temporary form of diabetes that some women develop during pregnancy.  You may have the GTT test after having a 1-hour glucose screening test if the results from that test indicate that you may have gestational diabetes. You may also have this test if you have any symptoms or risk factors for gestational diabetes.  Talk with your health care provider about what your results mean. This information is not intended to replace advice given to you by your health care provider. Make sure you discuss any questions you have with your health care provider. Document Revised: 08/09/2018 Document Reviewed: 11/28/2016 Elsevier Patient Education  2020 [employer]  

## 2020-03-31 NOTE — Progress Notes (Signed)
ROB doing well. Feeling well. U/s this afternoon for completion of anatomy. Discussed glucose screen next visit. She verbalizes and agrees to plan. Follow up 4.5 wks. With [facility].   Doreene Burke, CN M

## 2020-04-17 ENCOUNTER — Other Ambulatory Visit (INDEPENDENT_AMBULATORY_CARE_PROVIDER_SITE_OTHER): Payer: Medicaid Other | Admitting: Obstetrics and Gynecology

## 2020-04-17 MED ORDER — CLINDAMYCIN HCL 300 MG PO CAPS: 300.0000 mg | ORAL_CAPSULE | Freq: Two times a day (BID) | ORAL | 0 refills | Status: DC

## 2020-04-17 NOTE — Progress Notes (Signed)
Called by after hours line for patient who complains of recurrence of a boil on her buttock. Prescription of Clindamycin sent to patient's pharmacy (notes this worked well for her last time).    Hildred Laser, MD [facility]

## 2020-04-27 ENCOUNTER — Other Ambulatory Visit: Payer: Self-pay

## 2020-04-27 ENCOUNTER — Other Ambulatory Visit: Payer: Medicaid Other

## 2020-04-27 ENCOUNTER — Encounter: Payer: Self-pay | Admitting: Certified Nurse Midwife

## 2020-04-27 ENCOUNTER — Ambulatory Visit (INDEPENDENT_AMBULATORY_CARE_PROVIDER_SITE_OTHER): Payer: Medicaid Other | Admitting: Certified Nurse Midwife

## 2020-04-27 VITALS — BP 114/69 | HR 97 | Wt 184.7 lb

## 2020-04-27 DIAGNOSIS — Z3482 Encounter for supervision of other normal pregnancy, second trimester: Secondary | ICD-10-CM

## 2020-04-27 DIAGNOSIS — B379 Candidiasis, unspecified: Secondary | ICD-10-CM

## 2020-04-27 DIAGNOSIS — T3695XA Adverse effect of unspecified systemic antibiotic, initial encounter: Secondary | ICD-10-CM

## 2020-04-27 DIAGNOSIS — Z3A26 26 weeks gestation of pregnancy: Secondary | ICD-10-CM

## 2020-04-27 LAB — POCT URINALYSIS DIPSTICK OB
Bilirubin, UA: NEGATIVE
Blood, UA: NEGATIVE
Glucose, UA: NEGATIVE
Ketones, UA: NEGATIVE
Leukocytes, UA: NEGATIVE
Nitrite, UA: NEGATIVE
POC,PROTEIN,UA: NEGATIVE
Spec Grav, UA: 1.01 (ref 1.010–1.025)
Urobilinogen, UA: 0.2 E.U./dL
pH, UA: 7.5 (ref 5.0–8.0)

## 2020-04-27 MED ORDER — FLUCONAZOLE 150 MG PO TABS: 150.0000 mg | ORAL_TABLET | Freq: Once | ORAL | 0 refills | Status: AC

## 2020-04-27 NOTE — Progress Notes (Signed)
ROB- Doing well overall except for antibiotic induced yeast infection unrelieved by single Diflucan. Rx Diflucan, see orders. 28 week labs today, see orders. Blood transfusion and tubal consent completed. TDaP declined. Third trimester handouts along with water birth information provided. Breastfeeding education completed, see chart. and teaching completed. Circumcision questions answers, children as established with [facility]. Anticipatory guidance regarding course of prenatal care. Reviewed red flags and when to call. ROB x 2 weeks with [name] or sooner if needed.   Veronica Harmon, Student-[facility]ife [employer] 04/27/20 11:09 AM

## 2020-04-27 NOTE — Progress Notes (Signed)
I have seen, interviewed, and examined the patient in conjunction with the [employer] and affirm the diagnosis and management plan.   Gunnar Bulla, CNM [facility], [facility] 04/27/20 11:53 AM

## 2020-04-27 NOTE — Patient Instructions (Signed)
Breastfeeding Tips for a Good Latch Latching is how your baby's mouth attaches to your nipple to breastfeed. It is an important part of breastfeeding. Your baby may have trouble latching for a number of reasons. A poor latch may cause you to have cracked or sore nipples or other problems. Follow these instructions at home: How to position your baby  Find a comfortable place to sit or lie down. Your neck and back should be well supported.  If you are seated, place a pillow or rolled-up blanket under your baby. This will bring him or her to the level of your breast.  Make sure that your baby's belly (abdomen) is facing your belly.  Try different positions to find one that works best for you and your baby. How to help your baby latch   To start, gently rub your breast. Move your fingertips in a circle as you massage from your chest wall toward your nipple. This helps milk flow. Keep doing this during feeding if needed.  Position your breast. Hold your breast with four fingers underneath and your thumb above your nipple. Keep your fingers away from your nipple and your baby's mouth. Follow these steps to help your baby latch: 1. Rub your baby's lips gently with your finger or nipple. 2. When your baby's mouth is open wide enough, quickly bring your baby to your breast and place your whole nipple into your baby's mouth. Place as much of the colored area around your nipple (areola)as possible into your baby's mouth. 3. Your baby's tongue should be between his or her lower gum and your breast. 4. You should be able to see more areola above your baby's upper lip than below the lower lip. 5. When your baby starts sucking, you will feel a gentle pull on your nipple. You should not feel any pain. Be patient. It is common for a baby to suck for about 2-3 minutes to start the flow of breast milk. 6. Make sure that your baby's mouth is in the right position around your nipple. Your baby's lips should make  a seal on your breast and be turned outward.  General instructions  Look for these signs that your baby has latched on to your nipple: ? The baby is quietly tugging or sucking without causing you pain. ? You hear the baby swallow after every 3 or 4 sucks. ? You see movement above and in front of the baby's ears while he or she is sucking.  Be aware of these signs that your baby has not latched on to your nipple: ? The baby makes sucking sounds or smacking sounds while feeding. ? You have nipple pain.  If your baby is not latched well, put your little finger between your baby's gums and your nipple. This will break the seal. Then try to help your baby latch again.  If you keep having problems, get help from a breastfeeding specialist (lactation consultant). Contact a doctor if:  You have cracking or soreness in your nipples that lasts longer than 1 week.  You have nipple pain.  Your breasts are filled with too much milk (engorgement), and this does not improve after 48-72 hours.  You have a plugged milk duct and a fever.  You follow the tips for a good latch but you keep having problems or concerns.  You have a pus-like fluid coming from your breast.  Your baby is not gaining weight.  Your baby loses weight. Summary  Latching is how your   baby's mouth attaches to your nipple to breastfeed.  Try different positions for breastfeeding to find one that works best for you and your baby.  A poor latch may cause you to have cracked or sore nipples or other problems. This information is not intended to replace advice given to you by your health care provider. Make sure you discuss any questions you have with your health care provider. Document Revised: 08/08/2018 Document Reviewed: 11/23/2016 Elsevier Patient Education  2020 [employer]     Breastfeeding and Self-Care It is normal to have some problems when you start to breastfeed your new baby. But there are things that you  can do to take care of yourself and help prevent many common problems. This includes keeping your breasts healthy and making sure that your baby's mouth attaches (latches) properly to your nipple for feedings. Work with your doctor or breastfeeding specialist (Advertising copywriterlactation consultant) to find what works best for you. Follow these instructions at home: Breastfeeding strategy   Always make sure that your baby latches properly to breastfeed.  Make sure that your baby is in a proper position. Try different breastfeeding positions to find one that works best for you and your baby.  Breastfeed when you feel like you need to make your breasts less full or when your baby shows signs of hunger. This is called "breastfeeding on demand."  Do not delay feedings.  Try to relax when it is time to feed your baby. This helps your body release milk from your breast.  To help increase milk flow: ? Remove a small amount of milk from your breast right before breastfeeding. Do this using a pump or by squeezing with your hand. ? Apply warm, moist heat to your breast right before feeding. You can do this in the shower or with hand towels soaked with warm water. ? Massage your breast right before or during feeding. Breast care   To help your breasts stay healthy and keep them from getting too dry: ? Avoid using soap on your nipples. ? Let your nipples air-dry for 3-4 minutes after each feeding. ? Use only cotton bra pads to soak up breast milk that leaks. Be sure to change the pads if they become soaked with milk. If you use bra pads that can be thrown away, change them often. ? Put some lanolin on your nipples after breastfeeding. Pure lanolin does not need to be washed off your nipple before you feed your baby again. Pure lanolin is not harmful to your baby. ? Rub some breast milk into your nipples:  Use your hand to squeeze out a few drops of breast milk.  Gently massage the milk into your nipples.  Let  your nipples air-dry.  Wear a supportive nursing bra. Avoid wearing: ? Tight clothing. ? Underwire bras or bras that put pressure on your breasts.  Use ice to help relieve pain or swelling of your breasts: ? Put ice in a plastic bag. ? Place a towel between your skin and the bag. ? Leave the ice on for 20 minutes, 2-3 times a day. General instructions  Drink enough fluid to keep your pee (urine) pale yellow.  Get plenty of rest. Sleep when your baby sleeps.  Talk to your doctor or breastfeeding specialist before taking any herbal supplements. Contact a health care provider if:  You have nipple pain.  You have cracking or soreness in your nipples that lasts longer than 1 week.  Your breasts are overfilled with milk (  engorgement) and this lasts longer than 48 hours.  You have a fever.  You have pus-like fluid coming from your nipple.  You have redness, a rash, swelling, itching, or burning on your breast.  Your baby does not gain weight.  Your baby loses weight. Summary  There are things that you can do to take care of yourself and help prevent many common breastfeeding problems.  Always make sure that your baby's mouth attaches (latches) to your nipple properly to breastfeed.  Keep your nipples from getting too dry, drink plenty of fluid, and get plenty of rest.  Feed on demand. Do not delay feedings. This information is not intended to replace advice given to you by your health care provider. Make sure you discuss any questions you have with your health care provider. Document Revised: 08/08/2018 Document Reviewed: 11/23/2016 Elsevier Patient Education  2020 [employer].   Breastfeeding  Choosing to breastfeed is one of the best decisions you can make for yourself and your baby. A change in hormones during pregnancy causes your breasts to make breast milk in your milk-producing glands. Hormones prevent breast milk from being released before your baby is born. They  also prompt milk flow after birth. Once breastfeeding has begun, thoughts of your baby, as well as his or her sucking or crying, can stimulate the release of milk from your milk-producing glands. Benefits of breastfeeding Research shows that breastfeeding offers many health benefits for infants and mothers. It also offers a cost-free and convenient way to feed your baby. For your baby  Your first milk (colostrum) helps your baby's digestive system to function better.  Special cells in your milk (antibodies) help your baby to fight off infections.  Breastfed babies are less likely to develop asthma, allergies, obesity, or type 2 diabetes. They are also at lower risk for sudden infant death syndrome (SIDS).  Nutrients in breast milk are better able to meet your baby's needs compared to infant formula.  Breast milk improves your baby's brain development. For you  Breastfeeding helps to create a very special bond between you and your baby.  Breastfeeding is convenient. Breast milk costs nothing and is always available at the correct temperature.  Breastfeeding helps to burn calories. It helps you to lose the weight that you gained during pregnancy.  Breastfeeding makes your uterus return faster to its size before pregnancy. It also slows bleeding (lochia) after you give birth.  Breastfeeding helps to lower your risk of developing type 2 diabetes, osteoporosis, rheumatoid arthritis, cardiovascular disease, and breast, ovarian, uterine, and endometrial cancer later in life. Breastfeeding basics Starting breastfeeding  Find a comfortable place to sit or lie down, with your neck and back well-supported.  Place a pillow or a rolled-up blanket under your baby to bring him or her to the level of your breast (if you are seated). Nursing pillows are specially designed to help support your arms and your baby while you breastfeed.  Make sure that your baby's tummy (abdomen) is facing your  abdomen.  Gently massage your breast. With your fingertips, massage from the outer edges of your breast inward toward the nipple. This encourages milk flow. If your milk flows slowly, you may need to continue this action during the feeding.  Support your breast with 4 fingers underneath and your thumb above your nipple (make the letter "C" with your hand). Make sure your fingers are well away from your nipple and your baby's mouth.  Stroke your baby's lips gently with your  finger or nipple.  When your baby's mouth is open wide enough, quickly bring your baby to your breast, placing your entire nipple and as much of the areola as possible into your baby's mouth. The areola is the colored area around your nipple. ? More areola should be visible above your baby's upper lip than below the lower lip. ? Your baby's lips should be opened and extended outward (flanged) to ensure an adequate, comfortable latch. ? Your baby's tongue should be between his or her lower gum and your breast.  Make sure that your baby's mouth is correctly positioned around your nipple (latched). Your baby's lips should create a seal on your breast and be turned out (everted).  It is common for your baby to suck about 2-3 minutes in order to start the flow of breast milk. Latching Teaching your baby how to latch onto your breast properly is very important. An improper latch can cause nipple pain, decreased milk supply, and poor weight gain in your baby. Also, if your baby is not latched onto your nipple properly, he or she may swallow some air during feeding. This can make your baby fussy. Burping your baby when you switch breasts during the feeding can help to get rid of the air. However, teaching your baby to latch on properly is still the best way to prevent fussiness from swallowing air while breastfeeding. Signs that your baby has successfully latched onto your nipple  Silent tugging or silent sucking, without causing you  pain. Infant's lips should be extended outward (flanged).  Swallowing heard between every 3-4 sucks once your milk has started to flow (after your let-down milk reflex occurs).  Muscle movement above and in front of his or her ears while sucking. Signs that your baby has not successfully latched onto your nipple  Sucking sounds or smacking sounds from your baby while breastfeeding.  Nipple pain. If you think your baby has not latched on correctly, slip your finger into the corner of your baby's mouth to break the suction and place it between your baby's gums. Attempt to start breastfeeding again. Signs of successful breastfeeding Signs from your baby  Your baby will gradually decrease the number of sucks or will completely stop sucking.  Your baby will fall asleep.  Your baby's body will relax.  Your baby will retain a small amount of milk in his or her mouth.  Your baby will let go of your breast by himself or herself. Signs from you  Breasts that have increased in firmness, weight, and size 1-3 hours after feeding.  Breasts that are softer immediately after breastfeeding.  Increased milk volume, as well as a change in milk consistency and color by the fifth day of breastfeeding.  Nipples that are not sore, cracked, or bleeding. Signs that your baby is getting enough milk  Wetting at least 1-2 diapers during the first 24 hours after birth.  Wetting at least 5-6 diapers every 24 hours for the first week after birth. The urine should be clear or pale yellow by the age of 5 days.  Wetting 6-8 diapers every 24 hours as your baby continues to grow and develop.  At least 3 stools in a 24-hour period by the age of 5 days. The stool should be soft and yellow.  At least 3 stools in a 24-hour period by the age of 7 days. The stool should be seedy and yellow.  No loss of weight greater than 10% of birth weight during the  first 3 days of life.  Average weight gain of 4-7 oz  (113-198 g) per week after the age of 4 days.  Consistent daily weight gain by the age of 5 days, without weight loss after the age of 2 weeks. After a feeding, your baby may spit up a small amount of milk. This is normal. Breastfeeding frequency and duration Frequent feeding will help you make more milk and can prevent sore nipples and extremely full breasts (breast engorgement). Breastfeed when you feel the need to reduce the fullness of your breasts or when your baby shows signs of hunger. This is called "breastfeeding on demand." Signs that your baby is hungry include:  Increased alertness, activity, or restlessness.  Movement of the head from side to side.  Opening of the mouth when the corner of the mouth or cheek is stroked (rooting).  Increased sucking sounds, smacking lips, cooing, sighing, or squeaking.  Hand-to-mouth movements and sucking on fingers or hands.  Fussing or crying. Avoid introducing a pacifier to your baby in the first 4-6 weeks after your baby is born. After this time, you may choose to use a pacifier. Research has shown that pacifier use during the first year of a baby's life decreases the risk of sudden infant death syndrome (SIDS). Allow your baby to feed on each breast as long as he or she wants. When your baby unlatches or falls asleep while feeding from the first breast, offer the second breast. Because newborns are often sleepy in the first few weeks of life, you may need to awaken your baby to get him or her to feed. Breastfeeding times will vary from baby to baby. However, the following rules can serve as a guide to help you make sure that your baby is properly fed:  Newborns (babies 72 weeks of age or younger) may breastfeed every 1-3 hours.  Newborns should not go without breastfeeding for longer than 3 hours during the day or 5 hours during the night.  You should breastfeed your baby a minimum of 8 times in a 24-hour period. Breast milk pumping      Pumping and storing breast milk allows you to make sure that your baby is exclusively fed your breast milk, even at times when you are unable to breastfeed. This is especially important if you go back to work while you are still breastfeeding, or if you are not able to be present during feedings. Your lactation consultant can help you find a method of pumping that works best for you and give you guidelines about how long it is safe to store breast milk. Caring for your breasts while you breastfeed Nipples can become dry, cracked, and sore while breastfeeding. The following recommendations can help keep your breasts moisturized and healthy:  Avoid using soap on your nipples.  Wear a supportive bra designed especially for nursing. Avoid wearing underwire-style bras or extremely tight bras (sports bras).  Air-dry your nipples for 3-4 minutes after each feeding.  Use only cotton bra pads to absorb leaked breast milk. Leaking of breast milk between feedings is normal.  Use lanolin on your nipples after breastfeeding. Lanolin helps to maintain your skin's normal moisture barrier. Pure lanolin is not harmful (not toxic) to your baby. You may also hand express a few drops of breast milk and gently massage that milk into your nipples and allow the milk to air-dry. In the first few weeks after giving birth, some women experience breast engorgement. Engorgement can make your breasts  feel heavy, warm, and tender to the touch. Engorgement peaks within 3-5 days after you give birth. The following recommendations can help to ease engorgement:  Completely empty your breasts while breastfeeding or pumping. You may want to start by applying warm, moist heat (in the shower or with warm, water-soaked hand towels) just before feeding or pumping. This increases circulation and helps the milk flow. If your baby does not completely empty your breasts while breastfeeding, pump any extra milk after he or she is  finished.  Apply ice packs to your breasts immediately after breastfeeding or pumping, unless this is too uncomfortable for you. To do this: ? Put ice in a plastic bag. ? Place a towel between your skin and the bag. ? Leave the ice on for 20 minutes, 2-3 times a day.  Make sure that your baby is latched on and positioned properly while breastfeeding. If engorgement persists after 48 hours of following these recommendations, contact your health care provider or a Advertising copywriter. Overall health care recommendations while breastfeeding  Eat 3 healthy meals and 3 snacks every day. Well-nourished mothers who are breastfeeding need an additional 450-500 calories a day. You can meet this requirement by increasing the amount of a balanced diet that you eat.  Drink enough water to keep your urine pale yellow or clear.  Rest often, relax, and continue to take your prenatal vitamins to prevent fatigue, stress, and low vitamin and mineral levels in your body (nutrient deficiencies).  Do not use any products that contain nicotine or tobacco, such as cigarettes and e-cigarettes. Your baby may be harmed by chemicals from cigarettes that pass into breast milk and exposure to secondhand smoke. If you need help quitting, ask your health care provider.  Avoid alcohol.  Do not use illegal drugs or marijuana.  Talk with your health care provider before taking any medicines. These include over-the-counter and prescription medicines as well as vitamins and herbal supplements. Some medicines that may be harmful to your baby can pass through breast milk.  It is possible to become pregnant while breastfeeding. If birth control is desired, ask your health care provider about options that will be safe while breastfeeding your baby. Where to find more information: [employer]: www.llli.org Contact a health care provider if:  You feel like you want to stop breastfeeding or have become  frustrated with breastfeeding.  Your nipples are cracked or bleeding.  Your breasts are red, tender, or warm.  You have: ? Painful breasts or nipples. ? A swollen area on either breast. ? A fever or chills. ? Nausea or vomiting. ? Drainage other than breast milk from your nipples.  Your breasts do not become full before feedings by the fifth day after you give birth.  You feel sad and depressed.  Your baby is: ? Too sleepy to eat well. ? Having trouble sleeping. ? More than 87 week old and wetting fewer than 6 diapers in a 24-hour period. ? Not gaining weight by 38 days of age.  Your baby has fewer than 3 stools in a 24-hour period.  Your baby's skin or the white parts of his or her eyes become yellow. Get help right away if:  Your baby is overly tired (lethargic) and does not want to wake up and feed.  Your baby develops an unexplained fever. Summary  Breastfeeding offers many health benefits for infant and mothers.  Try to breastfeed your infant when he or she shows early signs of hunger.  Gently tickle or stroke your baby's lips with your finger or nipple to allow the baby to open his or her mouth. Bring the baby to your breast. Make sure that much of the areola is in your baby's mouth. Offer one side and burp the baby before you offer the other side.  Talk with your health care provider or lactation consultant if you have questions or you face problems as you breastfeed. This information is not intended to replace advice given to you by your health care provider. Make sure you discuss any questions you have with your health care provider. Document Revised: 07/13/2017 Document Reviewed: 05/20/2016 Elsevier Patient Education  2020 [employer].   Second Trimester of Pregnancy  The second trimester is from week 14 through week 27 (month 4 through 6). This is often the time in pregnancy that you feel your best. Often times, morning sickness has lessened or quit. You may  have more energy, and you may get hungry more often. Your unborn baby is growing rapidly. At the end of the sixth month, he or she is about 9 inches long and weighs about 1 pounds. You will likely feel the baby move between 18 and 20 weeks of pregnancy. Follow these instructions at home: Medicines  Take over-the-counter and prescription medicines only as told by your doctor. Some medicines are safe and some medicines are not safe during pregnancy.  Take a prenatal vitamin that contains at least 600 micrograms (mcg) of folic acid.  If you have trouble pooping (constipation), take medicine that will make your stool soft (stool softener) if your doctor approves. Eating and drinking   Eat regular, healthy meals.  Avoid raw meat and uncooked cheese.  If you get low calcium from the food you eat, talk to your doctor about taking a daily calcium supplement.  Avoid foods that are high in fat and sugars, such as fried and sweet foods.  If you feel sick to your stomach (nauseous) or throw up (vomit): ? Eat 4 or 5 small meals a day instead of 3 large meals. ? Try eating a few soda crackers. ? Drink liquids between meals instead of during meals.  To prevent constipation: ? Eat foods that are high in fiber, like fresh fruits and vegetables, whole grains, and beans. ? Drink enough fluids to keep your pee (urine) clear or pale yellow. Activity  Exercise only as told by your doctor. Stop exercising if you start to have cramps.  Do not exercise if it is too hot, too humid, or if you are in a place of great height (high altitude).  Avoid heavy lifting.  Wear low-heeled shoes. Sit and stand up straight.  You can continue to have sex unless your doctor tells you not to. Relieving pain and discomfort  Wear a good support bra if your breasts are tender.  Take warm water baths (sitz baths) to soothe pain or discomfort caused by hemorrhoids. Use hemorrhoid cream if your doctor approves.  Rest  with your legs raised if you have leg cramps or low back pain.  If you develop puffy, bulging veins (varicose veins) in your legs: ? Wear support hose or compression stockings as told by your doctor. ? Raise (elevate) your feet for 15 minutes, 3-4 times a day. ? Limit salt in your food. Prenatal care  Write down your questions. Take them to your prenatal visits.  Keep all your prenatal visits as told by your doctor. This is important. Safety  Wear your seat  belt when driving.  Make a list of emergency phone numbers, including numbers for family, friends, the hospital, and police and fire departments. General instructions  Ask your doctor about the right foods to eat or for help finding a counselor, if you need these services.  Ask your doctor about local prenatal classes. Begin classes before month 6 of your pregnancy.  Do not use hot tubs, steam rooms, or saunas.  Do not douche or use tampons or scented sanitary pads.  Do not cross your legs for long periods of time.  Visit your dentist if you have not done so. Use a soft toothbrush to brush your teeth. Floss gently.  Avoid all smoking, herbs, and alcohol. Avoid drugs that are not approved by your doctor.  Do not use any products that contain nicotine or tobacco, such as cigarettes and e-cigarettes. If you need help quitting, ask your doctor.  Avoid cat litter boxes and soil used by cats. These carry germs that can cause birth defects in the baby and can cause a loss of your baby (miscarriage) or stillbirth. Contact a doctor if:  You have mild cramps or pressure in your lower belly.  You have pain when you pee (urinate).  You have bad smelling fluid coming from your vagina.  You continue to feel sick to your stomach (nauseous), throw up (vomit), or have watery poop (diarrhea).  You have a nagging pain in your belly area.  You feel dizzy. Get help right away if:  You have a fever.  You are leaking fluid from your  vagina.  You have spotting or bleeding from your vagina.  You have severe belly cramping or pain.  You lose or gain weight rapidly.  You have trouble catching your breath and have chest pain.  You notice sudden or extreme puffiness (swelling) of your face, hands, ankles, feet, or legs.  You have not felt the baby move in over an hour.  You have severe headaches that do not go away when you take medicine.  You have trouble seeing. Summary  The second trimester is from week 14 through week 27 (months 4 through 6). This is often the time in pregnancy that you feel your best.  To take care of yourself and your unborn baby, you will need to eat healthy meals, take medicines only if your doctor tells you to do so, and do activities that are safe for you and your baby.  Call your doctor if you get sick or if you notice anything unusual about your pregnancy. Also, call your doctor if you need help with the right food to eat, or if you want to know what activities are safe for you. This information is not intended to replace advice given to you by your health care provider. Make sure you discuss any questions you have with your health care provider. Document Revised: 08/10/2018 Document Reviewed: 05/24/2016 [employer] Patient Education  2020 [employer].

## 2020-04-27 NOTE — Patient Instructions (Signed)
Breastfeeding Tips for a Good Latch Latching is how your baby's mouth attaches to your nipple to breastfeed. It is an important part of breastfeeding. Your baby may have trouble latching for a number of reasons. A poor latch may cause you to have cracked or sore nipples or other problems. Follow these instructions at home: How to position your baby  Find a comfortable place to sit or lie down. Your neck and back should be well supported.  If you are seated, place a pillow or rolled-up blanket under your baby. This will bring him or her to the level of your breast.  Make sure that your baby's belly (abdomen) is facing your belly.  Try different positions to find one that works best for you and your baby. How to help your baby latch   To start, gently rub your breast. Move your fingertips in a circle as you massage from your chest wall toward your nipple. This helps milk flow. Keep doing this during feeding if needed.  Position your breast. Hold your breast with four fingers underneath and your thumb above your nipple. Keep your fingers away from your nipple and your baby's mouth. Follow these steps to help your baby latch: 1. Rub your baby's lips gently with your finger or nipple. 2. When your baby's mouth is open wide enough, quickly bring your baby to your breast and place your whole nipple into your baby's mouth. Place as much of the colored area around your nipple (areola)as possible into your baby's mouth. 3. Your baby's tongue should be between his or her lower gum and your breast. 4. You should be able to see more areola above your baby's upper lip than below the lower lip. 5. When your baby starts sucking, you will feel a gentle pull on your nipple. You should not feel any pain. Be patient. It is common for a baby to suck for about 2-3 minutes to start the flow of breast milk. 6. Make sure that your baby's mouth is in the right position around your nipple. Your baby's lips should make  a seal on your breast and be turned outward.  General instructions  Look for these signs that your baby has latched on to your nipple: ? The baby is quietly tugging or sucking without causing you pain. ? You hear the baby swallow after every 3 or 4 sucks. ? You see movement above and in front of the baby's ears while he or she is sucking.  Be aware of these signs that your baby has not latched on to your nipple: ? The baby makes sucking sounds or smacking sounds while feeding. ? You have nipple pain.  If your baby is not latched well, put your little finger between your baby's gums and your nipple. This will break the seal. Then try to help your baby latch again.  If you keep having problems, get help from a breastfeeding specialist (lactation consultant). Contact a doctor if:  You have cracking or soreness in your nipples that lasts longer than 1 week.  You have nipple pain.  Your breasts are filled with too much milk (engorgement), and this does not improve after 48-72 hours.  You have a plugged milk duct and a fever.  You follow the tips for a good latch but you keep having problems or concerns.  You have a pus-like fluid coming from your breast.  Your baby is not gaining weight.  Your baby loses weight. Summary  Latching is how your   baby's mouth attaches to your nipple to breastfeed.  Try different positions for breastfeeding to find one that works best for you and your baby.  A poor latch may cause you to have cracked or sore nipples or other problems. This information is not intended to replace advice given to you by your health care provider. Make sure you discuss any questions you have with your health care provider. Document Revised: 08/08/2018 Document Reviewed: 11/23/2016 Elsevier Patient Education  [place].    Breastfeeding  Choosing to breastfeed is one of the best decisions you can make for yourself and your baby. A change in hormones during  pregnancy causes your breasts to make breast milk in your milk-producing glands. Hormones prevent breast milk from being released before your baby is born. They also prompt milk flow after birth. Once breastfeeding has begun, thoughts of your baby, as well as his or her sucking or crying, can stimulate the release of milk from your milk-producing glands. Benefits of breastfeeding Research shows that breastfeeding offers many health benefits for infants and mothers. It also offers a cost-free and convenient way to feed your baby. For your baby  Your first milk (colostrum) helps your baby's digestive system to function better.  Special cells in your milk (antibodies) help your baby to fight off infections.  Breastfed babies are less likely to develop asthma, allergies, obesity, or type 2 diabetes. They are also at lower risk for sudden infant death syndrome (SIDS).  Nutrients in breast milk are better able to meet your baby's needs compared to infant formula.  Breast milk improves your baby's brain development. For you  Breastfeeding helps to create a very special bond between you and your baby.  Breastfeeding is convenient. Breast milk costs nothing and is always available at the correct temperature.  Breastfeeding helps to burn calories. It helps you to lose the weight that you gained during pregnancy.  Breastfeeding makes your uterus return faster to its size before pregnancy. It also slows bleeding (lochia) after you give birth.  Breastfeeding helps to lower your risk of developing type 2 diabetes, osteoporosis, rheumatoid arthritis, cardiovascular disease, and breast, ovarian, uterine, and endometrial cancer later in life. Breastfeeding basics Starting breastfeeding  Find a comfortable place to sit or lie down, with your neck and back well-supported.  Place a pillow or a rolled-up blanket under your baby to bring him or her to the level of your breast (if you are seated). Nursing  pillows are specially designed to help support your arms and your baby while you breastfeed.  Make sure that your baby's tummy (abdomen) is facing your abdomen.  Gently massage your breast. With your fingertips, massage from the outer edges of your breast inward toward the nipple. This encourages milk flow. If your milk flows slowly, you may need to continue this action during the feeding.  Support your breast with 4 fingers underneath and your thumb above your nipple (make the letter "C" with your hand). Make sure your fingers are well away from your nipple and your baby's mouth.  Stroke your baby's lips gently with your finger or nipple.  When your baby's mouth is open wide enough, quickly bring your baby to your breast, placing your entire nipple and as much of the areola as possible into your baby's mouth. The areola is the colored area around your nipple. ? More areola should be visible above your baby's upper lip than below the lower lip. ? Your baby's lips should be opened  and extended outward (flanged) to ensure an adequate, comfortable latch. ? Your baby's tongue should be between his or her lower gum and your breast.  Make sure that your baby's mouth is correctly positioned around your nipple (latched). Your baby's lips should create a seal on your breast and be turned out (everted).  It is common for your baby to suck about 2-3 minutes in order to start the flow of breast milk. Latching Teaching your baby how to latch onto your breast properly is very important. An improper latch can cause nipple pain, decreased milk supply, and poor weight gain in your baby. Also, if your baby is not latched onto your nipple properly, he or she may swallow some air during feeding. This can make your baby fussy. Burping your baby when you switch breasts during the feeding can help to get rid of the air. However, teaching your baby to latch on properly is still the best way to prevent fussiness from  swallowing air while breastfeeding. Signs that your baby has successfully latched onto your nipple  Silent tugging or silent sucking, without causing you pain. Infant's lips should be extended outward (flanged).  Swallowing heard between every 3-4 sucks once your milk has started to flow (after your let-down milk reflex occurs).  Muscle movement above and in front of his or her ears while sucking. Signs that your baby has not successfully latched onto your nipple  Sucking sounds or smacking sounds from your baby while breastfeeding.  Nipple pain. If you think your baby has not latched on correctly, slip your finger into the corner of your baby's mouth to break the suction and place it between your baby's gums. Attempt to start breastfeeding again. Signs of successful breastfeeding Signs from your baby  Your baby will gradually decrease the number of sucks or will completely stop sucking.  Your baby will fall asleep.  Your baby's body will relax.  Your baby will retain a small amount of milk in his or her mouth.  Your baby will let go of your breast by himself or herself. Signs from you  Breasts that have increased in firmness, weight, and size 1-3 hours after feeding.  Breasts that are softer immediately after breastfeeding.  Increased milk volume, as well as a change in milk consistency and color by the fifth day of breastfeeding.  Nipples that are not sore, cracked, or bleeding. Signs that your baby is getting enough milk  Wetting at least 1-2 diapers during the first 24 hours after birth.  Wetting at least 5-6 diapers every 24 hours for the first week after birth. The urine should be clear or pale yellow by the age of 5 days.  Wetting 6-8 diapers every 24 hours as your baby continues to grow and develop.  At least 3 stools in a 24-hour period by the age of 5 days. The stool should be soft and yellow.  At least 3 stools in a 24-hour period by the age of 7 days. The  stool should be seedy and yellow.  No loss of weight greater than 10% of birth weight during the first 3 days of life.  Average weight gain of 4-7 oz (113-198 g) per week after the age of 4 days.  Consistent daily weight gain by the age of 5 days, without weight loss after the age of 2 weeks. After a feeding, your baby may spit up a small amount of milk. This is normal. Breastfeeding frequency and duration Frequent feeding will help  you make more milk and can prevent sore nipples and extremely full breasts (breast engorgement). Breastfeed when you feel the need to reduce the fullness of your breasts or when your baby shows signs of hunger. This is called "breastfeeding on demand." Signs that your baby is hungry include:  Increased alertness, activity, or restlessness.  Movement of the head from side to side.  Opening of the mouth when the corner of the mouth or cheek is stroked (rooting).  Increased sucking sounds, smacking lips, cooing, sighing, or squeaking.  Hand-to-mouth movements and sucking on fingers or hands.  Fussing or crying. Avoid introducing a pacifier to your baby in the first 4-6 weeks after your baby is born. After this time, you may choose to use a pacifier. Research has shown that pacifier use during the first year of a baby's life decreases the risk of sudden infant death syndrome (SIDS). Allow your baby to feed on each breast as long as he or she wants. When your baby unlatches or falls asleep while feeding from the first breast, offer the second breast. Because newborns are often sleepy in the first few weeks of life, you may need to awaken your baby to get him or her to feed. Breastfeeding times will vary from baby to baby. However, the following rules can serve as a guide to help you make sure that your baby is properly fed:  Newborns (babies 58 weeks of age or younger) may breastfeed every 1-3 hours.  Newborns should not go without breastfeeding for longer than 3  hours during the day or 5 hours during the night.  You should breastfeed your baby a minimum of 8 times in a 24-hour period. Breast milk pumping     Pumping and storing breast milk allows you to make sure that your baby is exclusively fed your breast milk, even at times when you are unable to breastfeed. This is especially important if you go back to work while you are still breastfeeding, or if you are not able to be present during feedings. Your lactation consultant can help you find a method of pumping that works best for you and give you guidelines about how long it is safe to store breast milk. Caring for your breasts while you breastfeed Nipples can become dry, cracked, and sore while breastfeeding. The following recommendations can help keep your breasts moisturized and healthy:  Avoid using soap on your nipples.  Wear a supportive bra designed especially for nursing. Avoid wearing underwire-style bras or extremely tight bras (sports bras).  Air-dry your nipples for 3-4 minutes after each feeding.  Use only cotton bra pads to absorb leaked breast milk. Leaking of breast milk between feedings is normal.  Use lanolin on your nipples after breastfeeding. Lanolin helps to maintain your skin's normal moisture barrier. Pure lanolin is not harmful (not toxic) to your baby. You may also hand express a few drops of breast milk and gently massage that milk into your nipples and allow the milk to air-dry. In the first few weeks after giving birth, some women experience breast engorgement. Engorgement can make your breasts feel heavy, warm, and tender to the touch. Engorgement peaks within 3-5 days after you give birth. The following recommendations can help to ease engorgement:  Completely empty your breasts while breastfeeding or pumping. You may want to start by applying warm, moist heat (in the shower or with warm, water-soaked hand towels) just before feeding or pumping. This increases  circulation and helps the milk flow.  If your baby does not completely empty your breasts while breastfeeding, pump any extra milk after he or she is finished.  Apply ice packs to your breasts immediately after breastfeeding or pumping, unless this is too uncomfortable for you. To do this: ? Put ice in a plastic bag. ? Place a towel between your skin and the bag. ? Leave the ice on for 20 minutes, 2-3 times a day.  Make sure that your baby is latched on and positioned properly while breastfeeding. If engorgement persists after 48 hours of following these recommendations, contact your health care provider or a Advertising copywriter. Overall health care recommendations while breastfeeding  Eat 3 healthy meals and 3 snacks every day. Well-nourished mothers who are breastfeeding need an additional 450-500 calories a day. You can meet this requirement by increasing the amount of a balanced diet that you eat.  Drink enough water to keep your urine pale yellow or clear.  Rest often, relax, and continue to take your prenatal vitamins to prevent fatigue, stress, and low vitamin and mineral levels in your body (nutrient deficiencies).  Do not use any products that contain nicotine or tobacco, such as cigarettes and e-cigarettes. Your baby may be harmed by chemicals from cigarettes that pass into breast milk and exposure to secondhand smoke. If you need help quitting, ask your health care provider.  Avoid alcohol.  Do not use illegal drugs or marijuana.  Talk with your health care provider before taking any medicines. These include over-the-counter and prescription medicines as well as vitamins and herbal supplements. Some medicines that may be harmful to your baby can pass through breast milk.  It is possible to become pregnant while breastfeeding. If birth control is desired, ask your health care provider about options that will be safe while breastfeeding your baby. Where to find more  information: [employer]: www.llli.org Contact a health care provider if:  You feel like you want to stop breastfeeding or have become frustrated with breastfeeding.  Your nipples are cracked or bleeding.  Your breasts are red, tender, or warm.  You have: ? Painful breasts or nipples. ? A swollen area on either breast. ? A fever or chills. ? Nausea or vomiting. ? Drainage other than breast milk from your nipples.  Your breasts do not become full before feedings by the fifth day after you give birth.  You feel sad and depressed.  Your baby is: ? Too sleepy to eat well. ? Having trouble sleeping. ? More than 80 week old and wetting fewer than 6 diapers in a 24-hour period. ? Not gaining weight by 28 days of age.  Your baby has fewer than 3 stools in a 24-hour period.  Your baby's skin or the white parts of his or her eyes become yellow. Get help right away if:  Your baby is overly tired (lethargic) and does not want to wake up and feed.  Your baby develops an unexplained fever. Summary  Breastfeeding offers many health benefits for infant and mothers.  Try to breastfeed your infant when he or she shows early signs of hunger.  Gently tickle or stroke your baby's lips with your finger or nipple to allow the baby to open his or her mouth. Bring the baby to your breast. Make sure that much of the areola is in your baby's mouth. Offer one side and burp the baby before you offer the other side.  Talk with your health care provider or lactation consultant if you have  questions or you face problems as you breastfeed. This information is not intended to replace advice given to you by your health care provider. Make sure you discuss any questions you have with your health care provider. Document Revised: 07/13/2017 Document Reviewed: 05/20/2016 Elsevier Patient Education  2020 [employer].    Second Trimester of Pregnancy  The second trimester is from week 14  through week 27 (month 4 through 6). This is often the time in pregnancy that you feel your best. Often times, morning sickness has lessened or quit. You may have more energy, and you may get hungry more often. Your unborn baby is growing rapidly. At the end of the sixth month, he or she is about 9 inches long and weighs about 1 pounds. You will likely feel the baby move between 18 and 20 weeks of pregnancy. Follow these instructions at home: Medicines  Take over-the-counter and prescription medicines only as told by your doctor. Some medicines are safe and some medicines are not safe during pregnancy.  Take a prenatal vitamin that contains at least 600 micrograms (mcg) of folic acid.  If you have trouble pooping (constipation), take medicine that will make your stool soft (stool softener) if your doctor approves. Eating and drinking   Eat regular, healthy meals.  Avoid raw meat and uncooked cheese.  If you get low calcium from the food you eat, talk to your doctor about taking a daily calcium supplement.  Avoid foods that are high in fat and sugars, such as fried and sweet foods.  If you feel sick to your stomach (nauseous) or throw up (vomit): ? Eat 4 or 5 small meals a day instead of 3 large meals. ? Try eating a few soda crackers. ? Drink liquids between meals instead of during meals.  To prevent constipation: ? Eat foods that are high in fiber, like fresh fruits and vegetables, whole grains, and beans. ? Drink enough fluids to keep your pee (urine) clear or pale yellow. Activity  Exercise only as told by your doctor. Stop exercising if you start to have cramps.  Do not exercise if it is too hot, too humid, or if you are in a place of great height (high altitude).  Avoid heavy lifting.  Wear low-heeled shoes. Sit and stand up straight.  You can continue to have sex unless your doctor tells you not to. Relieving pain and discomfort  Wear a good support bra if your breasts  are tender.  Take warm water baths (sitz baths) to soothe pain or discomfort caused by hemorrhoids. Use hemorrhoid cream if your doctor approves.  Rest with your legs raised if you have leg cramps or low back pain.  If you develop puffy, bulging veins (varicose veins) in your legs: ? Wear support hose or compression stockings as told by your doctor. ? Raise (elevate) your feet for 15 minutes, 3-4 times a day. ? Limit salt in your food. Prenatal care  Write down your questions. Take them to your prenatal visits.  Keep all your prenatal visits as told by your doctor. This is important. Safety  Wear your seat belt when driving.  Make a list of emergency phone numbers, including numbers for family, friends, the hospital, and police and fire departments. General instructions  Ask your doctor about the right foods to eat or for help finding a counselor, if you need these services.  Ask your doctor about local prenatal classes. Begin classes before month 6 of your pregnancy.  Do not  use hot tubs, steam rooms, or saunas.  Do not douche or use tampons or scented sanitary pads.  Do not cross your legs for long periods of time.  Visit your dentist if you have not done so. Use a soft toothbrush to brush your teeth. Floss gently.  Avoid all smoking, herbs, and alcohol. Avoid drugs that are not approved by your doctor.  Do not use any products that contain nicotine or tobacco, such as cigarettes and e-cigarettes. If you need help quitting, ask your doctor.  Avoid cat litter boxes and soil used by cats. These carry germs that can cause birth defects in the baby and can cause a loss of your baby (miscarriage) or stillbirth. Contact a doctor if:  You have mild cramps or pressure in your lower belly.  You have pain when you pee (urinate).  You have bad smelling fluid coming from your vagina.  You continue to feel sick to your stomach (nauseous), throw up (vomit), or have watery poop  (diarrhea).  You have a nagging pain in your belly area.  You feel dizzy. Get help right away if:  You have a fever.  You are leaking fluid from your vagina.  You have spotting or bleeding from your vagina.  You have severe belly cramping or pain.  You lose or gain weight rapidly.  You have trouble catching your breath and have chest pain.  You notice sudden or extreme puffiness (swelling) of your face, hands, ankles, feet, or legs.  You have not felt the baby move in over an hour.  You have severe headaches that do not go away when you take medicine.  You have trouble seeing. Summary  The second trimester is from week 14 through week 27 (months 4 through 6). This is often the time in pregnancy that you feel your best.  To take care of yourself and your unborn baby, you will need to eat healthy meals, take medicines only if your doctor tells you to do so, and do activities that are safe for you and your baby.  Call your doctor if you get sick or if you notice anything unusual about your pregnancy. Also, call your doctor if you need help with the right food to eat, or if you want to know what activities are safe for you. This information is not intended to replace advice given to you by your health care provider. Make sure you discuss any questions you have with your health care provider. Document Revised: 08/10/2018 Document Reviewed: 05/24/2016 Elsevier Patient Education  2020 [employer].

## 2020-04-27 NOTE — Progress Notes (Signed)
Pt c/o vaginal itching, increase in thick vaginal d/c. Pt was given clindamycin and diflucan at urgent care.

## 2020-04-28 LAB — CBC
Hematocrit: 30.8 % — ABNORMAL LOW (ref 34.0–46.6)
Hemoglobin: 10.6 g/dL — ABNORMAL LOW (ref 11.1–15.9)
MCH: 31.3 pg (ref 26.6–33.0)
MCHC: 34.4 g/dL (ref 31.5–35.7)
MCV: 91 fL (ref 79–97)
Platelets: 324 10*3/uL (ref 150–450)
RBC: 3.39 x10E6/uL — ABNORMAL LOW (ref 3.77–5.28)
RDW: 12.2 % (ref 11.7–15.4)
WBC: 11.5 10*3/uL — ABNORMAL HIGH (ref 3.4–10.8)

## 2020-04-28 LAB — GLUCOSE, 1 HOUR GESTATIONAL: Gestational Diabetes Screen: 130 mg/dL (ref 65–139)

## 2020-04-28 LAB — RPR: RPR Ser Ql: NONREACTIVE

## 2020-04-30 ENCOUNTER — Encounter: Payer: Self-pay | Admitting: Certified Nurse Midwife

## 2020-04-30 DIAGNOSIS — O99019 Anemia complicating pregnancy, unspecified trimester: Secondary | ICD-10-CM

## 2020-04-30 HISTORY — DX: Anemia complicating pregnancy, unspecified trimester: O99.019

## 2020-05-01 ENCOUNTER — Other Ambulatory Visit: Payer: Self-pay

## 2020-05-01 MED ORDER — IRON (FERROUS SULFATE) 325 (65 FE) MG PO TABS: 1.0000 | ORAL_TABLET | Freq: Every day | ORAL | 1 refills | Status: DC

## 2020-05-01 NOTE — Progress Notes (Signed)
rx Iron (as requested)

## 2020-05-02 NOTE — L&D Delivery Note (Addendum)
Delivery Note  In room to see patient, reports pelvic pressure and the urge to push with contractions. SVE: 10/100/+2, vertex at 1805.   Effective coached maternal pushing efforts.   Spontaneous vaginal birth of liveborn female infant in left occiput anterior position over intact perineum at 1813. Infant immediately to maternal abdomen. Delayed cord clamping and skin to skin. APGARs: 9, 9. Weight pending. Receiving present at bedside.   Pitocin bolus infusing. Large gush of blood noted. Spontaneous delivery of intact placenta at 1816. Cord double clamped and cut after cessation of pulsation, three (3) vessel and tube of cord blood collected. Uterus firm. Rubra small. Vault check completed under epidural anesthesia. QBL pending.    Initiate routine postpartum care and orders. Mom to postpartum.  Baby to Couplet care / Skin to Skin.  FOB and family member present and bedside and overjoyed with the birth.   Desires bilateral tubal ligation for permanent sterilization, after hours posting line notified.    Serafina Royals, CNM [facility], [facility] 07/24/2020, 6:31 PM

## 2020-05-15 ENCOUNTER — Telehealth: Payer: Self-pay

## 2020-05-15 NOTE — Telephone Encounter (Signed)
Mychart message sent to patient re: no visitors.

## 2020-05-17 ENCOUNTER — Encounter: Payer: Self-pay | Admitting: Certified Nurse Midwife

## 2020-05-17 DIAGNOSIS — Z20822 Contact with and (suspected) exposure to covid-19: Secondary | ICD-10-CM | POA: Insufficient documentation

## 2020-05-19 ENCOUNTER — Other Ambulatory Visit: Payer: Medicaid Other

## 2020-05-19 ENCOUNTER — Other Ambulatory Visit: Payer: Self-pay

## 2020-05-19 ENCOUNTER — Ambulatory Visit (INDEPENDENT_AMBULATORY_CARE_PROVIDER_SITE_OTHER): Payer: Medicaid Other | Admitting: Certified Nurse Midwife

## 2020-05-19 DIAGNOSIS — Z3A29 29 weeks gestation of pregnancy: Secondary | ICD-10-CM

## 2020-05-19 MED ORDER — ONDANSETRON 4 MG PO TBDP: 4.0000 mg | ORAL_TABLET | Freq: Three times a day (TID) | ORAL | 0 refills | Status: DC | PRN

## 2020-05-19 NOTE — Patient Instructions (Signed)
Pregnancy and COVID-19 Pregnant women and women who were recently pregnant are at an increased risk for severe illness from COVID-19. Other conditions, such as being pregnant at an older age or having diabetes or obesity, can further increase the risk of severe illness from COVID-19. This risk can last for at least 42 days following the end of the pregnancy. Protect yourself and your baby by:  Knowing your risk factors. Ask your health care provider about your specific risk factors.  Working with your health care team to protect yourself against all infections, including COVID-19. How does COVID-19 affect me? If you get COVID-19 while pregnant or shortly after your pregnancy, there is an increased risk that you may:  Get a respiratory illness that can lead to pneumonia or severe illness.  Give birth to your baby before 37 weeks of pregnancy (preterm birth).  Have other complications that can affect your pregnancy. How does COVID-19 affect my care? If you have COVID-19, special precautions will be taken around your pregnancy:  You will have to notify the clinic or hospital before a visit. Steps will be taken to protect other people from the virus, including seeing you in a special room.  Tests and scans may be done differently before delivery (prenatal care).  Your birth plan may change, including what room you will be in and who may be with you during labor and delivery.  You may stay longer in the hospital after delivery (postpartum care).  COVID-19 will affect where your baby will stay after delivery. Ask about the risks and benefits of staying in the same room with your baby. Benefits include breastfeeding and mother-newborn bonding.  You may have to feed your baby differently.  Visitors will be limited after your baby is born. How does COVID-19 affect my baby? It is very rare for a mother with COVID-19 to pass the virus to the unborn baby. After birth, a baby can get the virus if  he or she is exposed to it. Ask your health care provider about ways to protect your baby. The baby can be placed in an incubator. A physical barrier can also be used. What can I do to lower my risk? Medicines and vaccines  You can receive a COVID-19 vaccination. This can protect you from severe illness. If you have concerns, talk to your health care provider.  Get other recommended vaccines, including the flu vaccine and the whooping cough (Tdap) vaccine.  Ask your health care provider if you can get a 30-day, or longer, supply of your medicines, so you can make fewer trips to the pharmacy.  If you have received a COVID-19 vaccine, consider enrolling in the v-safe program from the CDC. This program uses an app on your smartphone to provide check-ins and gather information on your health after you receive the vaccine. There is a separate registry for pregnant women. For more information, visit: ? V-safe tool: http://boyd.org/ ? V-safe pregnancy registry: [employer].be Cleaning and personal hygiene If you are in isolation for COVID-19 and are sharing a room with your newborn, take these steps to reduce the risk of spreading the virus to your newborn:  Wash your hands with soap and water for at least 20 seconds before holding or caring for your baby. If soap and water are not available, use alcohol-based hand sanitizer.  Wear a mask when within 6 feet (1.8 m) of your baby.  Keep your baby more than 6 feet (1.8 m) away from you as much as possible.  Avoid touching your mouth, face, eyes, or nose before washing your hands.  Clean and disinfect objects and surfaces that are frequently touched.   Other things to do  Avoid people who might have been exposed to or infected with COVID-19, including people who live with you.  Cover your mouth and nose by wearing a mask or other cloth covering over your face when you go out in public.  Avoid people who are not wearing  a mask.  Avoid large crowds. Maintain at least 6 feet (1.8 m) between yourself and others.  Avoid poorly ventilated spaces.  Call your health care provider if you have any health concerns. ? Contact your health care provider right away if you think you have COVID-19. Tell your health care provider that you think you may have a COVID-19 infection and that you are pregnant. Breastfeeding tips Plan with your family and health care team how to feed your baby. Current research shows that the virus may not pass to a baby through breast milk. If you are breastfeeding, you can receive a COVID-19 vaccine. The vaccines pose no risk for breastfeeding mothers or their babies.  Some of the vaccines might create antibodies in breast milk. These antibodies can help to protect your baby. Take precautions if you have or may have COVID-19. Precautions include:  Washing hands with soap and water for at least 20 seconds before feeding your baby. If soap and water are not available, use alcohol-based hand sanitizer.  Wearing a mask while feeding your baby.  Pumping or expressing breast milk to feed to your baby. If possible, ask someone in your household who is not sick to feed your baby the expressed breast milk. ? Wash your hands with soap and water for at least 20 seconds before touching pump parts. ? Wash and disinfect all pump parts after expressing milk. Follow the manufacturer's instructions to clean and disinfect all pump parts. Follow these instructions: Managing stress Some pregnant and postpartum women may have fear, uncertainty, and stress because of COVID-19. Find ways to manage stress. These may include:  Using relaxation techniques such as meditation and deep breathing.  Getting regular exercise. Most women can continue their usual exercise routine during pregnancy. Ask your health care provider what activities are safe for you.  Seeking support from family, friends, or spiritual resources.  If you cannot be together in person, you can still connect by phone calls, texts, video calls, or online messaging.  Doing relaxing activities that you enjoy, such as listening to music or reading a good book. General instructions  Follow your health care provider's instructions on taking medicines. Some medicines may not be safe to take during pregnancy.  Ask for help if you have counseling or nutritional needs. Your health care provider can offer advice or refer you to resources or specialists who can help you with various needs.  Keep all follow-up visits. This is important. This includes visits before and after you have your baby. Questions to ask your health care team  What should I do if I have COVID-19 symptoms?  What are the side effects that can occur after receiving any of the available COVID-19 vaccines?  How will COVID-19 affect my prenatal care visits, tests and scans, labor and delivery, and postpartum care?  What are the risks of COVID-19 to me and the potential risks to my unborn baby or infant?  How do vaccines pass antibodies to my unborn baby?  Should I plan to breastfeed my baby?  Where can I find mental health resources?  Where can I find support if I have financial concerns? Where to find more information  CDC: www.cdc.gov/pregnant-people.html  World Health Organization (WHO): www.who.int/pregnancy-and-childbirth  American College of Obstetricians and Gynecologists (ACOG): www.acog.org Contact a health care provider if:  You have signs and symptoms of infection, including a fever or cough. ? Tell your health care team that you think you may have a COVID-19 infection and that you are pregnant.  You have strong emotions, such as sadness or anxiety.  You feel unsafe in your home and need help finding a safe place to live.  You have bloody or watery vaginal discharge or vaginal bleeding. Get help right away if:  You have signs or symptoms of labor  before 37 weeks of pregnancy. These include: ? Contractions that are 5 minutes or less apart, or that increase in frequency, intensity, or length. ? Sudden, sharp pain in the abdomen or in the lower back. ? A gush or trickle of fluid from your vagina.  You have signs of more serious illness, such as: ? Trouble breathing. ? Chest pain. ? A fever of 102.2F (39C) or higher that does not go away. ? Vomiting every time you drink fluids. ? Feeling extremely weak. ? Fainting. These symptoms may represent a serious problem that is an emergency. Do not wait to see if the symptoms will go away. Get medical help right away. Call your local emergency services (911 in the [place] Do not drive yourself to the hospital. Summary  Pregnant women and women who were recently pregnant are at an increased risk for severe illness from COVID-19.  Take precautions to protect yourself and your baby. Wear a mask. Wash hands often. Avoid touching your mouth, face, eyes, or nose before washing hands. Avoid large groups of people and stay away from people who are sick.  If you think you have a COVID-19 infection, contact your health care provider right away. Tell your health care provider that you think you have COVID-19 and that you are pregnant.  If you have COVID-19, special precautions may be taken during pregnancy, labor and delivery, and after delivery. This information is not intended to replace advice given to you by your health care provider. Make sure you discuss any questions you have with your health care provider. Document Revised: 03/02/2020 Document Reviewed: 03/02/2020 Elsevier Patient Education  2021 Elsevier Inc.  

## 2020-05-19 NOTE — Progress Notes (Signed)
Received a transferred call from Ardelle Lesches for a televisit due to the patient testing positive for COVID.DOB as identifier. Patient reports weight as 185.2. States she has good fetal movement. Call transferred to Doreene Burke CNM for completion of televisit.

## 2020-05-19 NOTE — Progress Notes (Signed)
Virtual Visit via Telephone Note  I connected with Veronica Harmon on 05/19/20 at 10:45 AM EST by telephone and verified that I am speaking with the correct person using two identifiers.  Location: Patient: at home Provider: at office   I discussed the limitations, risks, security and privacy concerns of performing an evaluation and management service by telephone and the availability of in person appointments. I also discussed with the patient that there may be a patient responsible charge related to this service. The patient expressed understanding and agreed to proceed.   History of Present Illness: G 4P2012 29.5 weeks tested positive for COVID 1/15. Symptoms included body aches, sore throat, coughing, fever and nausea.   Observations/Objective: States she had a fever on Saturday that she managed with extra strength tylenol. Has not had a fever since then. She feels good fetal movement. Has had some braxton hicks cts denies sign of PTL.   Assessment and Plan: Discussed fetal movement , PO hydration, small frequent meals, rest, Symptom treatment. Zofran ordered for nausea, continue tylenol as need. Evaluated for fetal movement at least 2 times daily. Discussed red flag symptoms and reasons for follow up in ED. She verbalizes and agrees to plan.   Follow Up Instructions: She will follow up as scheduled in 2 wk with Marcelino Duster or PRN.     I discussed the assessment and treatment plan with the patient. The patient was provided an opportunity to ask questions and all were answered. The patient agreed with the plan and demonstrated an understanding of the instructions.   The patient was advised to call back or seek an in-person evaluation if the symptoms worsen or if the condition fails to improve as anticipated.  I provided 10 minutes of non-face-to-face time during this encounter.   Doreene Burke, CNM

## 2020-05-20 NOTE — Addendum Note (Signed)
Addended by: Mechele Claude on: 05/20/2020 03:59 PM   Modules accepted: Level of Service

## 2020-06-04 ENCOUNTER — Encounter: Payer: Medicaid Other | Admitting: Certified Nurse Midwife

## 2020-06-05 ENCOUNTER — Ambulatory Visit (INDEPENDENT_AMBULATORY_CARE_PROVIDER_SITE_OTHER): Payer: Medicaid Other | Admitting: Certified Nurse Midwife

## 2020-06-05 ENCOUNTER — Other Ambulatory Visit: Payer: Self-pay

## 2020-06-05 VITALS — BP 126/69 | HR 111 | Wt 192.1 lb

## 2020-06-05 DIAGNOSIS — Z872 Personal history of diseases of the skin and subcutaneous tissue: Secondary | ICD-10-CM

## 2020-06-05 DIAGNOSIS — Z3483 Encounter for supervision of other normal pregnancy, third trimester: Secondary | ICD-10-CM

## 2020-06-05 DIAGNOSIS — Z3A32 32 weeks gestation of pregnancy: Secondary | ICD-10-CM

## 2020-06-05 DIAGNOSIS — B009 Herpesviral infection, unspecified: Secondary | ICD-10-CM

## 2020-06-05 DIAGNOSIS — L299 Pruritus, unspecified: Secondary | ICD-10-CM

## 2020-06-05 LAB — POCT URINALYSIS DIPSTICK OB
Bilirubin, UA: NEGATIVE
Blood, UA: NEGATIVE
Glucose, UA: NEGATIVE
Ketones, UA: NEGATIVE
Leukocytes, UA: NEGATIVE
Nitrite, UA: NEGATIVE
POC,PROTEIN,UA: NEGATIVE
Spec Grav, UA: 1.02 (ref 1.010–1.025)
Urobilinogen, UA: 0.2 E.U./dL
pH, UA: 6 (ref 5.0–8.0)

## 2020-06-05 MED ORDER — VALACYCLOVIR HCL 500 MG PO TABS: 500.0000 mg | ORAL_TABLET | Freq: Two times a day (BID) | ORAL | 6 refills | Status: DC

## 2020-06-05 MED ORDER — TRIAMCINOLONE ACETONIDE 0.5 % EX OINT: 1.0000 "application " | TOPICAL_OINTMENT | Freq: Two times a day (BID) | CUTANEOUS | 0 refills | Status: DC

## 2020-06-05 NOTE — Patient Instructions (Signed)
WHAT OB PATIENTS CAN EXPECT   Confirmation of pregnancy and ultrasound ordered if medically indicated-[redacted] weeks gestation  New OB (NOB) intake with nurse and New OB (NOB) labs- [redacted] weeks gestation  New OB (NOB) physical examination with provider- 11/[redacted] weeks gestation  Flu vaccine-[redacted] weeks gestation  Anatomy scan-[redacted] weeks gestation  Glucose tolerance test, blood work to test for anemia, T-dap vaccine-[redacted] weeks gestation  Vaginal swabs/cultures-STD/Group B strep-[redacted] weeks gestation  Appointments every 4 weeks until 28 weeks  Every 2 weeks from 28 weeks until 36 weeks  Weekly visits from 36 weeks until delivery    Fetal Movement Counts Patient Name: ________________________________________________ Patient Due Date: ____________________  What is a fetal movement count? A fetal movement count is the number of times that you feel your baby move during a certain amount of time. This may also be called a fetal kick count. A fetal movement count is recommended for every pregnant woman. You may be asked to start counting fetal movements as early as week 28 of your pregnancy. Pay attention to when your baby is most active. You may notice your baby's sleep and wake cycles. You may also notice things that make your baby move more. You should do a fetal movement count:  When your baby is normally most active.  At the same time each day. A good time to count movements is while you are resting, after having something to eat and drink. How do I count fetal movements? 1. Find a quiet, comfortable area. Sit, or lie down on your side. 2. Write down the date, the start time and stop time, and the number of movements that you felt between those two times. Take this information with you to your health care visits. 3. Write down your start time when you feel the first movement. 4. Count kicks, flutters, swishes, rolls, and jabs. You should feel at least 10 movements. 5. You may stop counting after you  have felt 10 movements, or if you have been counting for 2 hours. Write down the stop time. 6. If you do not feel 10 movements in 2 hours, contact your health care provider for further instructions. Your health care provider may want to do additional tests to assess your baby's well-being. Contact a health care provider if:  You feel fewer than 10 movements in 2 hours.  Your baby is not moving like he or she usually does. Date: ____________ Start time: ____________ Stop time: ____________ Movements: ____________ Date: ____________ Start time: ____________ Stop time: ____________ Movements: ____________ Date: ____________ Start time: ____________ Stop time: ____________ Movements: ____________ Date: ____________ Start time: ____________ Stop time: ____________ Movements: ____________ Date: ____________ Start time: ____________ Stop time: ____________ Movements: ____________ Date: ____________ Start time: ____________ Stop time: ____________ Movements: ____________ Date: ____________ Start time: ____________ Stop time: ____________ Movements: ____________ Date: ____________ Start time: ____________ Stop time: ____________ Movements: ____________ Date: ____________ Start time: ____________ Stop time: ____________ Movements: ____________ This information is not intended to replace advice given to you by your health care provider. Make sure you discuss any questions you have with your health care provider. Document Revised: 12/06/2018 Document Reviewed: 12/06/2018 Elsevier Patient Education  2021 [employer].

## 2020-06-05 NOTE — Progress Notes (Signed)
ROB-Doing well, recovering from COVID. Reports itching to palms of hands, soles of feet and legs. History significant for eczema. Discussed home treatment measures. Encouraged kick counts. Rx Triamcinolone, see orders. Labs: CMP and Bile Acids, will collect next week labs closed today. History of HSV; Rx Valtrex, see orders. Anticipatory guidance regarding course of prenatal care. Reviewed red flag symptoms and when to call. RTC x 2 weeks for ROB or sooner if needed.

## 2020-06-09 ENCOUNTER — Other Ambulatory Visit: Payer: Medicaid Other

## 2020-06-11 ENCOUNTER — Other Ambulatory Visit: Payer: Medicaid Other

## 2020-06-11 ENCOUNTER — Other Ambulatory Visit: Payer: Self-pay

## 2020-06-13 LAB — COMPREHENSIVE METABOLIC PANEL
ALT: 10 IU/L (ref 0–32)
AST: 15 IU/L (ref 0–40)
Albumin/Globulin Ratio: 1.5 (ref 1.2–2.2)
Albumin: 3.5 g/dL — ABNORMAL LOW (ref 3.8–4.8)
Alkaline Phosphatase: 137 IU/L — ABNORMAL HIGH (ref 44–121)
BUN/Creatinine Ratio: 11 (ref 9–23)
BUN: 5 mg/dL — ABNORMAL LOW (ref 6–20)
Bilirubin Total: <0.2 mg/dL (ref 0.0–1.2)
CO2: 17 mmol/L — ABNORMAL LOW (ref 20–29)
Calcium: 8.8 mg/dL (ref 8.7–10.2)
Chloride: 105 mmol/L (ref 96–106)
Creatinine, Ser: 0.45 mg/dL — ABNORMAL LOW (ref 0.57–1.00)
GFR calc Af Amer: 154 mL/min/{1.73_m2} (ref >59)
GFR calc non Af Amer: 134 mL/min/{1.73_m2} (ref >59)
Globulin, Total: 2.3 g/dL (ref 1.5–4.5)
Glucose: 104 mg/dL — ABNORMAL HIGH (ref 65–99)
Potassium: 4.1 mmol/L (ref 3.5–5.2)
Sodium: 139 mmol/L (ref 134–144)
Total Protein: 5.8 g/dL — ABNORMAL LOW (ref 6.0–8.5)

## 2020-06-13 LAB — BILE ACIDS, TOTAL: Bile Acids Total: 8.4 umol/L (ref 0.0–10.0)

## 2020-06-17 ENCOUNTER — Encounter: Payer: Self-pay | Admitting: Certified Nurse Midwife

## 2020-06-17 ENCOUNTER — Other Ambulatory Visit: Payer: Self-pay

## 2020-06-17 ENCOUNTER — Ambulatory Visit (INDEPENDENT_AMBULATORY_CARE_PROVIDER_SITE_OTHER): Payer: Medicaid Other | Admitting: Certified Nurse Midwife

## 2020-06-17 VITALS — BP 114/74 | HR 115 | Wt 191.8 lb

## 2020-06-17 DIAGNOSIS — Z3A33 33 weeks gestation of pregnancy: Secondary | ICD-10-CM

## 2020-06-17 LAB — POCT URINALYSIS DIPSTICK OB
Glucose, UA: NEGATIVE
Ketones, UA: NEGATIVE
Nitrite, UA: NEGATIVE
Spec Grav, UA: 1.02 (ref 1.010–1.025)
Urobilinogen, UA: 0.2 E.U./dL
pH, UA: 7 (ref 5.0–8.0)

## 2020-06-17 MED ORDER — ZOLPIDEM TARTRATE 5 MG PO TABS: 5.0000 mg | ORAL_TABLET | Freq: Every evening | ORAL | 0 refills | Status: DC | PRN

## 2020-06-17 NOTE — Progress Notes (Signed)
Rob doing well.Feels good movement. Pt state she is fatigued all the time. She is not sleeping well due to restless leg and itching. She has tried benadryl which helps some with the itching but not the restless leg. Discussed use of Ambien to help with sleep , discussed risks and benefits in pregnancy . Cautioned pt to use sparingly and not in conjunction with benadryl. She verbalizes and agrees. Order placed for 5 mg tablets. Pt state Lab were checked last week. Will continue to monitor . Follow up 2 wk with Marcelino Duster for rob.   Doreene Burke, CNM

## 2020-06-17 NOTE — Patient Instructions (Signed)

## 2020-06-22 ENCOUNTER — Other Ambulatory Visit: Payer: Self-pay | Admitting: Certified Nurse Midwife

## 2020-06-22 MED ORDER — HYDROXYZINE HCL 10 MG PO TABS
10.0000 mg | ORAL_TABLET | Freq: Three times a day (TID) | ORAL | 0 refills | Status: DC | PRN
Start: 2020-06-22 — End: 2020-07-26

## 2020-06-23 ENCOUNTER — Encounter: Payer: Medicaid Other | Admitting: Certified Nurse Midwife

## 2020-07-02 ENCOUNTER — Other Ambulatory Visit: Payer: Self-pay

## 2020-07-02 ENCOUNTER — Other Ambulatory Visit (HOSPITAL_COMMUNITY)
Admission: RE | Admit: 2020-07-02 | Discharge: 2020-07-02 | Disposition: A | Discharge status: Home / Self Care | Payer: Medicaid Other | Source: Ambulatory Visit | Attending: Certified Nurse Midwife | Admitting: Certified Nurse Midwife

## 2020-07-02 ENCOUNTER — Ambulatory Visit (INDEPENDENT_AMBULATORY_CARE_PROVIDER_SITE_OTHER): Payer: Medicaid Other | Admitting: Certified Nurse Midwife

## 2020-07-02 VITALS — BP 110/50 | HR 111 | Wt 195.2 lb

## 2020-07-02 DIAGNOSIS — N898 Other specified noninflammatory disorders of vagina: Secondary | ICD-10-CM

## 2020-07-02 DIAGNOSIS — Z113 Encounter for screening for infections with a predominantly sexual mode of transmission: Secondary | ICD-10-CM

## 2020-07-02 DIAGNOSIS — Z3403 Encounter for supervision of normal first pregnancy, third trimester: Secondary | ICD-10-CM

## 2020-07-02 DIAGNOSIS — Z3A36 36 weeks gestation of pregnancy: Secondary | ICD-10-CM | POA: Diagnosis present

## 2020-07-02 DIAGNOSIS — Z3685 Encounter for antenatal screening for Streptococcus B: Secondary | ICD-10-CM

## 2020-07-02 LAB — POCT URINALYSIS DIPSTICK OB
Appearance: ABNORMAL
Bilirubin, UA: NEGATIVE
Blood, UA: POSITIVE
Glucose, UA: NEGATIVE
Ketones, UA: NEGATIVE
Leukocytes, UA: NEGATIVE
Nitrite, UA: NEGATIVE
Odor: NORMAL
Spec Grav, UA: 1.01 (ref 1.010–1.025)
Urobilinogen, UA: 0.2 E.U./dL
pH, UA: 6.5 (ref 5.0–8.0)

## 2020-07-02 NOTE — Progress Notes (Signed)
ROB-Getting ready for baby, reports loss of mucus plug and intermittent pelvic pain/ligthening crotch. Notes vaginal itching. Request SVE. 36 week cultures collected today, see orders. Pre-labor checklist, herbal prep guide, and birth affirmations given. Reviewed red flag symptoms and when to call. RTC x 1 week for ROB or sooner if needed.

## 2020-07-02 NOTE — Addendum Note (Signed)
Addended by: Tommie Raymond on: 07/02/2020 02:56 PM   Modules accepted: Orders

## 2020-07-02 NOTE — Progress Notes (Signed)
ROB: She is having some vaginal itching.

## 2020-07-02 NOTE — Patient Instructions (Signed)
Fetal Movement Counts Patient Name: ________________________________________________ Patient Due Date: ____________________  What is a fetal movement count? A fetal movement count is the number of times that you feel your baby move during a certain amount of time. This may also be called a fetal kick count. A fetal movement count is recommended for every pregnant woman. You may be asked to start counting fetal movements as early as week 28 of your pregnancy. Pay attention to when your baby is most active. You may notice your baby's sleep and wake cycles. You may also notice things that make your baby move more. You should do a fetal movement count:  When your baby is normally most active.  At the same time each day. A good time to count movements is while you are resting, after having something to eat and drink. How do I count fetal movements? 1. Find a quiet, comfortable area. Sit, or lie down on your side. 2. Write down the date, the start time and stop time, and the number of movements that you felt between those two times. Take this information with you to your health care visits. 3. Write down your start time when you feel the first movement. 4. Count kicks, flutters, swishes, rolls, and jabs. You should feel at least 10 movements. 5. You may stop counting after you have felt 10 movements, or if you have been counting for 2 hours. Write down the stop time. 6. If you do not feel 10 movements in 2 hours, contact your health care provider for further instructions. Your health care provider may want to do additional tests to assess your baby's well-being. Contact a health care provider if:  You feel fewer than 10 movements in 2 hours.  Your baby is not moving like he or she usually does. Date: ____________ Start time: ____________ Stop time: ____________ Movements: ____________ Date: ____________ Start time: ____________ Stop time: ____________ Movements: ____________ Date: ____________  Start time: ____________ Stop time: ____________ Movements: ____________ Date: ____________ Start time: ____________ Stop time: ____________ Movements: ____________ Date: ____________ Start time: ____________ Stop time: ____________ Movements: ____________ Date: ____________ Start time: ____________ Stop time: ____________ Movements: ____________ Date: ____________ Start time: ____________ Stop time: ____________ Movements: ____________ Date: ____________ Start time: ____________ Stop time: ____________ Movements: ____________ Date: ____________ Start time: ____________ Stop time: ____________ Movements: ____________ This information is not intended to replace advice given to you by your health care provider. Make sure you discuss any questions you have with your health care provider. Document Revised: 12/06/2018 Document Reviewed: 12/06/2018 Elsevier Patient Education  2021 Elsevier Inc.  

## 2020-07-06 ENCOUNTER — Encounter: Payer: Self-pay | Admitting: Certified Nurse Midwife

## 2020-07-06 ENCOUNTER — Other Ambulatory Visit: Payer: Self-pay

## 2020-07-06 ENCOUNTER — Ambulatory Visit (INDEPENDENT_AMBULATORY_CARE_PROVIDER_SITE_OTHER): Payer: Medicaid Other | Admitting: Certified Nurse Midwife

## 2020-07-06 VITALS — BP 127/79 | HR 116 | Wt 195.4 lb

## 2020-07-06 DIAGNOSIS — Z3A36 36 weeks gestation of pregnancy: Secondary | ICD-10-CM

## 2020-07-06 DIAGNOSIS — Z3403 Encounter for supervision of normal first pregnancy, third trimester: Secondary | ICD-10-CM

## 2020-07-06 LAB — POCT URINALYSIS DIPSTICK OB
Bilirubin, UA: NEGATIVE
Blood, UA: NEGATIVE
Glucose, UA: NEGATIVE
Ketones, UA: NEGATIVE
Leukocytes, UA: NEGATIVE
Nitrite, UA: NEGATIVE
Spec Grav, UA: 1.025 (ref 1.010–1.025)
Urobilinogen, UA: 0.2 E.U./dL
pH, UA: 6.5 (ref 5.0–8.0)

## 2020-07-06 LAB — CERVICOVAGINAL ANCILLARY ONLY
Bacterial Vaginitis (gardnerella): NEGATIVE
Candida Glabrata: NEGATIVE
Candida Vaginitis: POSITIVE — AB
Chlamydia: NEGATIVE
Comment: NEGATIVE
Comment: NEGATIVE
Comment: NEGATIVE
Comment: NEGATIVE
Comment: NEGATIVE
Comment: NORMAL
Neisseria Gonorrhea: NEGATIVE
Trichomonas: NEGATIVE

## 2020-07-06 LAB — CULTURE, BETA STREP (GROUP B ONLY): Strep Gp B Culture: NEGATIVE

## 2020-07-06 MED ORDER — FLUCONAZOLE 150 MG PO TABS: 150.0000 mg | ORAL_TABLET | Freq: Once | ORAL | 1 refills | Status: AC

## 2020-07-06 NOTE — Addendum Note (Signed)
Addended by: Mechele Claude on: 07/06/2020 03:45 PM   Modules accepted: Orders

## 2020-07-06 NOTE — Patient Instructions (Signed)

## 2020-07-06 NOTE — Progress Notes (Signed)
OB-Pt present for routine prenatal care. Pt stated having lower abd pain and vaginal pain and pressure.

## 2020-07-06 NOTE — Progress Notes (Addendum)
ROB doing well. Feeling good fetal movement. Discussed labor/SROM precautions she verbalizes understanding . SVE per pt request 2/50/-2 station .  Pt states she would prefer the table for treatment for yeast, orders placed.  Follow up 1 wk for ROB with Marcelino Duster or PRN.   Doreene Burke, CNM

## 2020-07-16 ENCOUNTER — Encounter: Payer: Self-pay | Admitting: Certified Nurse Midwife

## 2020-07-16 ENCOUNTER — Other Ambulatory Visit: Payer: Self-pay

## 2020-07-16 ENCOUNTER — Ambulatory Visit (INDEPENDENT_AMBULATORY_CARE_PROVIDER_SITE_OTHER): Payer: Medicaid Other | Admitting: Certified Nurse Midwife

## 2020-07-16 VITALS — BP 129/84 | HR 112 | Wt 195.5 lb

## 2020-07-16 DIAGNOSIS — Z3403 Encounter for supervision of normal first pregnancy, third trimester: Secondary | ICD-10-CM

## 2020-07-16 DIAGNOSIS — Z3A38 38 weeks gestation of pregnancy: Secondary | ICD-10-CM

## 2020-07-16 LAB — POCT URINALYSIS DIPSTICK OB
Bilirubin, UA: NEGATIVE
Blood, UA: NEGATIVE
Glucose, UA: NEGATIVE
Ketones, UA: NEGATIVE
Leukocytes, UA: NEGATIVE
Nitrite, UA: NEGATIVE
POC,PROTEIN,UA: NEGATIVE
Spec Grav, UA: 1.01 (ref 1.010–1.025)
Urobilinogen, UA: 0.2 E.U./dL
pH, UA: 5 (ref 5.0–8.0)

## 2020-07-16 NOTE — Progress Notes (Signed)
I have seen, interviewed, and examined the patient in conjunction with the [facility] [employer] [facility] and affirm the diagnosis and management plan.   Gunnar Bulla, CNM [facility], [facility] 07/16/20 11:47 AM

## 2020-07-16 NOTE — Patient Instructions (Signed)
 Fetal Movement Counts Patient Name: ________________________________________________ Patient Due Date: ____________________  What is a fetal movement count? A fetal movement count is the number of times that you feel your baby move during a certain amount of time. This may also be called a fetal kick count. A fetal movement count is recommended for every pregnant woman. You may be asked to start counting fetal movements as early as week 28 of your pregnancy. Pay attention to when your baby is most active. You may notice your baby's sleep and wake cycles. You may also notice things that make your baby move more. You should do a fetal movement count:  When your baby is normally most active.  At the same time each day. A good time to count movements is while you are resting, after having something to eat and drink. How do I count fetal movements? 1. Find a quiet, comfortable area. Sit, or lie down on your side. 2. Write down the date, the start time and stop time, and the number of movements that you felt between those two times. Take this information with you to your health care visits. 3. Write down your start time when you feel the first movement. 4. Count kicks, flutters, swishes, rolls, and jabs. You should feel at least 10 movements. 5. You may stop counting after you have felt 10 movements, or if you have been counting for 2 hours. Write down the stop time. 6. If you do not feel 10 movements in 2 hours, contact your health care provider for further instructions. Your health care provider may want to do additional tests to assess your baby's well-being. Contact a health care provider if:  You feel fewer than 10 movements in 2 hours.  Your baby is not moving like he or she usually does. Date: ____________ Start time: ____________ Stop time: ____________ Movements: ____________ Date: ____________ Start time: ____________ Stop time: ____________ Movements: ____________ Date: ____________  Start time: ____________ Stop time: ____________ Movements: ____________ Date: ____________ Start time: ____________ Stop time: ____________ Movements: ____________ Date: ____________ Start time: ____________ Stop time: ____________ Movements: ____________ Date: ____________ Start time: ____________ Stop time: ____________ Movements: ____________ Date: ____________ Start time: ____________ Stop time: ____________ Movements: ____________ Date: ____________ Start time: ____________ Stop time: ____________ Movements: ____________ Date: ____________ Start time: ____________ Stop time: ____________ Movements: ____________ This information is not intended to replace advice given to you by your health care provider. Make sure you discuss any questions you have with your health care provider. Document Revised: 12/06/2018 Document Reviewed: 12/06/2018 Elsevier Patient Education  2021 Elsevier Inc.    Third Trimester of Pregnancy  The third trimester of pregnancy is from week 28 through week 40. This is also called months 7 through 9. This trimester is when your unborn baby (fetus) is growing very fast. At the end of the ninth month, the unborn baby is about 20 inches long. It weighs about 6-10 pounds. Body changes during your third trimester Your body continues to go through many changes during this time. The changes vary and generally return to normal after the baby is born. Physical changes  Your weight will continue to increase. You may gain 25-35 pounds (11-16 kg) by the end of the pregnancy. If you are underweight, you may gain 28-40 lb (about 13-18 kg). If you are overweight, you may gain 15-25 lb (about 7-11 kg).  You may start to get stretch marks on your hips, belly (abdomen), and breasts.  Your breasts will continue to   grow and may hurt. A yellow fluid (colostrum) may leak from your breasts. This is the first milk you are making for your baby.  You may have changes in your hair.  Your  belly button may stick out.  You may have more swelling in your hands, face, or ankles. Health changes  You may have heartburn.  You may have trouble pooping (constipation).  You may get hemorrhoids. These are swollen veins in the butt that can itch or get painful.  You may have swollen veins (varicose veins) in your legs.  You may have more body aches in the pelvis, back, or thighs.  You may have more tingling or numbness in your hands, arms, and legs. The skin on your belly may also feel numb.  You may feel short of breath as your womb (uterus) gets bigger. Other changes  You may pee (urinate) more often.  You may have more problems sleeping.  You may notice the unborn baby "dropping," or moving lower in your belly.  You may have more discharge coming from your vagina.  Your joints may feel loose, and you may have pain around your pelvic bone. Follow these instructions at home: Medicines  Take over-the-counter and prescription medicines only as told by your doctor. Some medicines are not safe during pregnancy.  Take a prenatal vitamin that contains at least 600 micrograms (mcg) of folic acid. Eating and drinking  Eat healthy meals that include: ? Fresh fruits and vegetables. ? Whole grains. ? Good sources of protein, such as meat, eggs, or tofu. ? Low-fat dairy products.  Avoid raw meat and unpasteurized juice, milk, and cheese. These carry germs that can harm you and your baby.  Eat 4 or 5 small meals rather than 3 large meals a day.  You may need to take these actions to prevent or treat trouble pooping: ? Drink enough fluids to keep your pee (urine) pale yellow. ? Eat foods that are high in fiber. These include beans, whole grains, and fresh fruits and vegetables. ? Limit foods that are high in fat and sugar. These include fried or sweet foods. Activity  Exercise only as told by your doctor. Stop exercising if you start to have cramps in your womb.  Avoid  heavy lifting.  Do not exercise if it is too hot or too humid, or if you are in a place of great height (high altitude).  If you choose to, you may have sex unless your doctor tells you not to. Relieving pain and discomfort  Take breaks often, and rest with your legs raised (elevated) if you have leg cramps or low back pain.  Take warm water baths (sitz baths) to soothe pain or discomfort caused by hemorrhoids. Use hemorrhoid cream if your doctor approves.  Wear a good support bra if your breasts are tender.  If you develop bulging, swollen veins in your legs: ? Wear support hose as told by your doctor. ? Raise your feet for 15 minutes, 3-4 times a day. ? Limit salt in your food. Safety  Talk to your doctor before traveling far distances.  Do not use hot tubs, steam rooms, or saunas.  Wear your seat belt at all times when you are in a car.  Talk with your doctor if someone is hurting you or yelling at you a lot. Preparing for your baby's arrival To prepare for the arrival of your baby:  Take prenatal classes.  Visit the hospital and tour the maternity area.  Buy   a rear-facing car seat. Learn how to install it in your car.  Prepare the baby's room. Take out all pillows and stuffed animals from the baby's crib. General instructions  Avoid cat litter boxes and soil used by cats. These carry germs that can cause harm to the baby and can cause a loss of your baby by miscarriage or stillbirth.  Do not douche or use tampons. Do not use scented sanitary pads.  Do not smoke or use any products that contain nicotine or tobacco. If you need help quitting, ask your doctor.  Do not drink alcohol.  Do not use herbal medicines, illegal drugs, or medicines that were not approved by your doctor. Chemicals in these products can affect your baby.  Keep all follow-up visits. This is important. Where to find more information  American Pregnancy Association:  americanpregnancy.org  American College of Obstetricians and Gynecologists: www.acog.org  Office on Women's Health: womenshealth.gov/pregnancy Contact a doctor if:  You have a fever.  You have mild cramps or pressure in your lower belly.  You have a nagging pain in your belly area.  You vomit, or you have watery poop (diarrhea).  You have bad-smelling fluid coming from your vagina.  You have pain when you pee, or your pee smells bad.  You have a headache that does not go away when you take medicine.  You have changes in how you see, or you see spots in front of your eyes. Get help right away if:  Your water breaks.  You have regular contractions that are less than 5 minutes apart.  You are spotting or bleeding from your vagina.  You have very bad belly cramps or pain.  You have trouble breathing.  You have chest pain.  You faint.  You have not felt the baby move for the amount of time told by your doctor.  You have new or increased pain, swelling, or redness in an arm or leg. Summary  The third trimester is from week 28 through week 40 (months 7 through 9). This is the time when your unborn baby is growing very fast.  During this time, your discomfort may increase as you gain weight and as your baby grows.  Get ready for your baby to arrive by taking prenatal classes, buying a rear-facing car seat, and preparing the baby's room.  Get help right away if you are bleeding from your vagina, you have chest pain and trouble breathing, or you have not felt the baby move for the amount of time told by your doctor. This information is not intended to replace advice given to you by your health care provider. Make sure you discuss any questions you have with your health care provider. Document Revised: 09/25/2019 Document Reviewed: 08/01/2019 Elsevier Patient Education  2021 Elsevier Inc.  

## 2020-07-16 NOTE — Progress Notes (Signed)
ROB- Reports of restless legs no relief with use of Vistaril. Encouraged twice daily Magnesium oxide supplement, see MyChart message. Request SVE, unchanged since previous visit. Discussed home labor preparation techniques and use of herbal prep. Anticipatory guidance regarding course of prenatal care. Reviewed red flag symptoms and when to call. RTC x 1 week for ROB with [name]; and x 2 weeks for ROB with JML or sooner if needed.  Juliann Pares, Student-MidWife [employer] 07/16/20 11:33 AM

## 2020-07-21 ENCOUNTER — Ambulatory Visit (INDEPENDENT_AMBULATORY_CARE_PROVIDER_SITE_OTHER): Payer: Medicaid Other | Admitting: Certified Nurse Midwife

## 2020-07-21 ENCOUNTER — Other Ambulatory Visit: Payer: Self-pay

## 2020-07-21 ENCOUNTER — Encounter: Payer: Self-pay | Admitting: Certified Nurse Midwife

## 2020-07-21 VITALS — BP 120/77 | HR 109 | Wt 196.0 lb

## 2020-07-21 DIAGNOSIS — Z3A38 38 weeks gestation of pregnancy: Secondary | ICD-10-CM

## 2020-07-21 LAB — POCT URINALYSIS DIPSTICK OB
Bilirubin, UA: NEGATIVE
Blood, UA: NEGATIVE
Clarity, UA: NEGATIVE
Glucose, UA: NEGATIVE
Ketones, UA: NEGATIVE
Leukocytes, UA: NEGATIVE
Nitrite, UA: NEGATIVE
Odor: NEGATIVE
POC,PROTEIN,UA: NEGATIVE
Spec Grav, UA: 1.015 (ref 1.010–1.025)
Urobilinogen, UA: 0.2 E.U./dL
pH, UA: 7.5 (ref 5.0–8.0)

## 2020-07-21 NOTE — Patient Instructions (Signed)

## 2020-07-21 NOTE — Progress Notes (Signed)
ROB doing well. Feels good fetal movement. Pt c/o thin discharge, speculum placed no fluid in vaginal vult, clear/white discharge noted. Nitrazine negative. No fluid seen with cough.  Labor precautions reviewed. All questions answered . SVE and stripping of membranes per pt request. 3-4/50/-2.   Follow up 1 wk with Marcelino Duster for ROB.   Doreene Burke, CNM

## 2020-07-21 NOTE — Progress Notes (Signed)
ROB- watery discharge since last night after IC.

## 2020-07-23 ENCOUNTER — Encounter: Payer: Self-pay | Admitting: Obstetrics and Gynecology

## 2020-07-23 ENCOUNTER — Observation Stay (HOSPITAL_BASED_OUTPATIENT_CLINIC_OR_DEPARTMENT_OTHER)
Admission: EM | Admit: 2020-07-23 | Discharge: 2020-07-23 | Disposition: A | Discharge status: Home / Self Care | Payer: Medicaid Other | Source: Home / Self Care | Admitting: Certified Nurse Midwife

## 2020-07-23 DIAGNOSIS — Z3A39 39 weeks gestation of pregnancy: Secondary | ICD-10-CM | POA: Insufficient documentation

## 2020-07-23 DIAGNOSIS — O471 False labor at or after 37 completed weeks of gestation: Secondary | ICD-10-CM | POA: Insufficient documentation

## 2020-07-23 MED ORDER — ZOLPIDEM TARTRATE 5 MG PO TABS
5.0000 mg | ORAL_TABLET | Freq: Once | ORAL | Status: AC
  Administered 2020-07-23: 5 mg via ORAL

## 2020-07-23 MED ORDER — ZOLPIDEM TARTRATE 5 MG PO TABS
ORAL_TABLET | ORAL | Status: AC
  Filled 2020-07-23: qty 1

## 2020-07-23 NOTE — OB Triage Note (Signed)
    L&D OB Triage Note  SUBJECTIVE Veronica Harmon is a 30 y.o. [id number] female at [redacted]w[redacted]d, EDD Estimated Date of Delivery: 07/30/20 who presented to triage with complaints of contractions. She denies loss of fluid, vaginal bleeding and is feeling good fetal movement..   OB History  Gravida Para Term Preterm AB Living  5 2 2  0 1 2  SAB IAB Ectopic Multiple Live Births  0 1 0 0 2    # Outcome Date GA Lbr Len/2nd Weight Sex Delivery Anes PTL Lv  5 Current           4 Term 12/20/12   3402 g F Vag-Spont EPI N LIV     Name: 12/22/12  3 IAB 2009          2 Term 04/19/07   3260 g M Vag-Spont EPI N LIV     Name: 04/21/07  1 Gravida             Medications Prior to Admission  Medication Sig Dispense Refill Last Dose  . Iron, Ferrous Sulfate, 325 (65 Fe) MG TABS Take 1 tablet by mouth daily. 60 tablet 1 07/22/2020 at Unknown time  . Prenatal Vit-Fe Fumarate-FA (MULTIVITAMIN-PRENATAL) 27-0.8 MG TABS tablet Take 1 tablet by mouth daily.    07/22/2020 at Unknown time  . valACYclovir (VALTREX) 500 MG tablet Take 1 tablet (500 mg total) by mouth 2 (two) times daily. Start at 36 weeks of pregnancy 30 tablet 6 07/22/2020 at Unknown time  . acetaminophen (TYLENOL) 500 MG tablet Take 500-1,000 mg by mouth every 6 (six) hours as needed for mild pain or fever.     . Butalbital-APAP-Caffeine 50-325-40 MG capsule Take 2 capsules by mouth every 6 (six) hours as needed for headache. 10 capsule 0   . hydrOXYzine (ATARAX/VISTARIL) 10 MG tablet Take 1 tablet (10 mg total) by mouth 3 (three) times daily as needed. 30 tablet 0   . ondansetron (ZOFRAN-ODT) 4 MG disintegrating tablet Take 1 tablet (4 mg total) by mouth every 8 (eight) hours as needed for nausea or vomiting. 20 tablet 0   . triamcinolone ointment (KENALOG) 0.5 % Apply 1 application topically 2 (two) times daily. 30 g 0      OBJECTIVE  Nursing Evaluation:   BP 122/72 (BP Location: Right Arm)   Pulse (!) 105   Temp 98.2 F (36.8 C) (Oral)   Resp 18    Ht 5\' 6"  (1.676 m)   Wt 88.9 kg   LMP 10/19/2019 Comment: spotting only  BMI 31.64 kg/m    Findings:        Irregular contractions      NST was performed and has been reviewed by me.  NST INTERPRETATION: Category I  Mode: External Baseline Rate (A): 125 bpm Variability: Moderate Accelerations: 15 x 15 Decelerations: None     Contraction Frequency (min): irregular (difficult to trace due to pt position)  ASSESSMENT Impression:  1.  Pregnancy:  at [redacted]w[redacted]d , EDD Estimated Date of Delivery: 07/30/20 2.  Reassuring fetal and maternal status 3.  No cervical change in 2 hrs.  PLAN 1. Discussed current condition and above findings with patient and reassurance given.  All questions answered. 2. Discharge home with standard labor precautions given to return to L&D or call the office for problems. 3. Continue routine prenatal care.    [redacted]w[redacted]d, CNM

## 2020-07-23 NOTE — Discharge Instructions (Signed)
Return to hospital for any of the following symptoms:  -contractions at least 5 minutes apart -leakage of fluid -decreased fluid movement -vaginal bleeding

## 2020-07-23 NOTE — OB Triage Note (Signed)
Discharge instructions and labor precautions reviewed with patient and significant other. All questions answered.

## 2020-07-23 NOTE — OB Triage Note (Signed)
Pt reports to unit c/o ctx that began after getting her membranes swept in the office yesterday. Pt reports hav ing irregular ctx since then and also having decreased FM. Pt denies LOF and vaginal bleeding. External monitors applied and assessing. Initial FHTs 130s. Vital signs WDL.

## 2020-07-24 ENCOUNTER — Inpatient Hospital Stay
Admission: EM | Admit: 2020-07-24 | Discharge: 2020-07-26 | DRG: 797 | Disposition: A | Discharge status: Home / Self Care | Payer: Medicaid Other | Attending: Certified Nurse Midwife | Admitting: Certified Nurse Midwife

## 2020-07-24 ENCOUNTER — Encounter: Payer: Self-pay | Admitting: Obstetrics and Gynecology

## 2020-07-24 ENCOUNTER — Inpatient Hospital Stay: Payer: Medicaid Other | Admitting: Certified Registered Nurse Anesthetist

## 2020-07-24 ENCOUNTER — Other Ambulatory Visit: Payer: Self-pay

## 2020-07-24 DIAGNOSIS — O4202 Full-term premature rupture of membranes, onset of labor within 24 hours of rupture: Secondary | ICD-10-CM

## 2020-07-24 DIAGNOSIS — Z8616 Personal history of COVID-19: Secondary | ICD-10-CM

## 2020-07-24 DIAGNOSIS — O4292 Full-term premature rupture of membranes, unspecified as to length of time between rupture and onset of labor: Principal | ICD-10-CM | POA: Diagnosis present

## 2020-07-24 DIAGNOSIS — Z302 Encounter for sterilization: Secondary | ICD-10-CM | POA: Diagnosis not present

## 2020-07-24 DIAGNOSIS — O9832 Other infections with a predominantly sexual mode of transmission complicating childbirth: Secondary | ICD-10-CM | POA: Diagnosis present

## 2020-07-24 DIAGNOSIS — O9902 Anemia complicating childbirth: Secondary | ICD-10-CM | POA: Diagnosis present

## 2020-07-24 DIAGNOSIS — Z3A39 39 weeks gestation of pregnancy: Secondary | ICD-10-CM

## 2020-07-24 DIAGNOSIS — Z20822 Contact with and (suspected) exposure to covid-19: Secondary | ICD-10-CM | POA: Diagnosis present

## 2020-07-24 DIAGNOSIS — O26893 Other specified pregnancy related conditions, third trimester: Secondary | ICD-10-CM | POA: Diagnosis present

## 2020-07-24 DIAGNOSIS — Z87891 Personal history of nicotine dependence: Secondary | ICD-10-CM

## 2020-07-24 DIAGNOSIS — B009 Herpesviral infection, unspecified: Secondary | ICD-10-CM | POA: Diagnosis present

## 2020-07-24 DIAGNOSIS — O99019 Anemia complicating pregnancy, unspecified trimester: Secondary | ICD-10-CM | POA: Diagnosis present

## 2020-07-24 DIAGNOSIS — O4200 Premature rupture of membranes, onset of labor within 24 hours of rupture, unspecified weeks of gestation: Secondary | ICD-10-CM

## 2020-07-24 DIAGNOSIS — A6000 Herpesviral infection of urogenital system, unspecified: Secondary | ICD-10-CM | POA: Diagnosis present

## 2020-07-24 LAB — CBC
HCT: 31.8 % — ABNORMAL LOW (ref 36.0–46.0)
Hemoglobin: 10.6 g/dL — ABNORMAL LOW (ref 12.0–15.0)
MCH: 29.3 pg (ref 26.0–34.0)
MCHC: 33.3 g/dL (ref 30.0–36.0)
MCV: 87.8 fL (ref 80.0–100.0)
Platelets: 411 10*3/uL — ABNORMAL HIGH (ref 150–400)
RBC: 3.62 MIL/uL — ABNORMAL LOW (ref 3.87–5.11)
RDW: 14 % (ref 11.5–15.5)
WBC: 12.3 10*3/uL — ABNORMAL HIGH (ref 4.0–10.5)
nRBC: 0 % (ref 0.0–0.2)

## 2020-07-24 LAB — RESP PANEL BY RT-PCR (FLU A&B, COVID) ARPGX2
Influenza A by PCR: NEGATIVE
Influenza B by PCR: NEGATIVE
SARS Coronavirus 2 by RT PCR: NEGATIVE

## 2020-07-24 LAB — TYPE AND SCREEN
ABO/RH(D): O POS
Antibody Screen: NEGATIVE

## 2020-07-24 LAB — RPR: RPR Ser Ql: NONREACTIVE

## 2020-07-24 MED ORDER — DIPHENHYDRAMINE HCL 50 MG/ML IJ SOLN: 12.5000 mg | INTRAMUSCULAR | Status: DC | PRN

## 2020-07-24 MED ORDER — MISOPROSTOL 25 MCG QUARTER TABLET
50.0000 ug | ORAL_TABLET | ORAL | Status: DC
  Administered 2020-07-24: 50 ug via ORAL
  Filled 2020-07-24 (×4): qty 2
  Filled 2020-07-24: qty 1

## 2020-07-24 MED ORDER — TERBUTALINE SULFATE 1 MG/ML IJ SOLN: 0.2500 mg | Freq: Once | INTRAMUSCULAR | Status: DC | PRN

## 2020-07-24 MED ORDER — LIDOCAINE HCL (PF) 1 % IJ SOLN
INTRAMUSCULAR | Status: DC | PRN
  Administered 2020-07-24: 2 mL via SUBCUTANEOUS

## 2020-07-24 MED ORDER — FENTANYL 2.5 MCG/ML W/ROPIVACAINE 0.15% IN NS 100 ML EPIDURAL (ARMC)
12.0000 mL/h | EPIDURAL | Status: DC
  Administered 2020-07-24: 12 mL/h via EPIDURAL

## 2020-07-24 MED ORDER — LACTATED RINGERS IV SOLN
500.0000 mL | Freq: Once | INTRAVENOUS | Status: AC
  Administered 2020-07-24: 500 mL via INTRAVENOUS

## 2020-07-24 MED ORDER — LACTATED RINGERS IV SOLN: INTRAVENOUS | Status: DC

## 2020-07-24 MED ORDER — EPHEDRINE 5 MG/ML INJ
10.0000 mg | INTRAVENOUS | Status: DC | PRN
  Filled 2020-07-24: qty 2

## 2020-07-24 MED ORDER — MISOPROSTOL 200 MCG PO TABS
ORAL_TABLET | ORAL | Status: AC
  Filled 2020-07-24: qty 4

## 2020-07-24 MED ORDER — SOD CITRATE-CITRIC ACID 500-334 MG/5ML PO SOLN
30.0000 mL | ORAL | Status: DC | PRN
Start: 2020-07-24 — End: 2020-07-25

## 2020-07-24 MED ORDER — MISOPROSTOL 50MCG HALF TABLET: 50.0000 ug | ORAL_TABLET | Freq: Once | ORAL | Status: DC

## 2020-07-24 MED ORDER — OXYTOCIN-SODIUM CHLORIDE 30-0.9 UT/500ML-% IV SOLN
2.5000 [IU]/h | INTRAVENOUS | Status: DC
  Administered 2020-07-24 (×2): 2.5 [IU]/h via INTRAVENOUS
  Filled 2020-07-24 (×2): qty 500

## 2020-07-24 MED ORDER — ACETAMINOPHEN 500 MG PO TABS
1000.0000 mg | ORAL_TABLET | Freq: Four times a day (QID) | ORAL | Status: DC | PRN
  Administered 2020-07-24: 1000 mg via ORAL
  Filled 2020-07-24: qty 2

## 2020-07-24 MED ORDER — OXYTOCIN-SODIUM CHLORIDE 30-0.9 UT/500ML-% IV SOLN
1.0000 m[IU]/min | INTRAVENOUS | Status: DC
  Administered 2020-07-24: 2 m[IU]/min via INTRAVENOUS

## 2020-07-24 MED ORDER — OXYTOCIN BOLUS FROM INFUSION: 333.0000 mL | Freq: Once | INTRAVENOUS | Status: DC

## 2020-07-24 MED ORDER — OXYTOCIN 10 UNIT/ML IJ SOLN
INTRAMUSCULAR | Status: AC
  Filled 2020-07-24: qty 2

## 2020-07-24 MED ORDER — AMMONIA AROMATIC IN INHA
RESPIRATORY_TRACT | Status: AC
  Filled 2020-07-24: qty 10

## 2020-07-24 MED ORDER — FENTANYL 2.5 MCG/ML W/ROPIVACAINE 0.15% IN NS 100 ML EPIDURAL (ARMC)
EPIDURAL | Status: AC
  Filled 2020-07-24: qty 100

## 2020-07-24 MED ORDER — BUPIVACAINE HCL (PF) 0.25 % IJ SOLN
INTRAMUSCULAR | Status: DC | PRN
  Administered 2020-07-24 (×2): 4 mL via EPIDURAL

## 2020-07-24 MED ORDER — PHENYLEPHRINE 40 MCG/ML (10ML) SYRINGE FOR IV PUSH (FOR BLOOD PRESSURE SUPPORT)
80.0000 ug | PREFILLED_SYRINGE | INTRAVENOUS | Status: DC | PRN
  Filled 2020-07-24: qty 10

## 2020-07-24 MED ORDER — FENTANYL CITRATE (PF) 100 MCG/2ML IJ SOLN
50.0000 ug | INTRAMUSCULAR | Status: DC | PRN
  Administered 2020-07-24: 50 ug via INTRAVENOUS
  Filled 2020-07-24: qty 2

## 2020-07-24 MED ORDER — LIDOCAINE HCL (PF) 1 % IJ SOLN
INTRAMUSCULAR | Status: AC
  Filled 2020-07-24: qty 30

## 2020-07-24 MED ORDER — ONDANSETRON HCL 4 MG/2ML IJ SOLN: 4.0000 mg | Freq: Four times a day (QID) | INTRAMUSCULAR | Status: DC | PRN

## 2020-07-24 MED ORDER — LIDOCAINE HCL (PF) 1 % IJ SOLN: 30.0000 mL | INTRAMUSCULAR | Status: DC | PRN

## 2020-07-24 MED ORDER — LACTATED RINGERS IV SOLN: 500.0000 mL | INTRAVENOUS | Status: DC | PRN

## 2020-07-24 MED ORDER — ZOLPIDEM TARTRATE 5 MG PO TABS: 5.0000 mg | ORAL_TABLET | Freq: Every evening | ORAL | Status: DC | PRN

## 2020-07-24 MED ORDER — LIDOCAINE-EPINEPHRINE (PF) 1.5 %-1:200000 IJ SOLN
INTRAMUSCULAR | Status: DC | PRN
  Administered 2020-07-24: 3 mL via PERINEURAL

## 2020-07-24 NOTE — Progress Notes (Signed)
Patient ID: Veronica Harmon, female   DOB: [birth year], 30 y.o.   MRN: [id number]  Veronica Harmon is a 30 y.o. [id number] at [redacted]w[redacted]d by ultrasound admitted for rupture of membranes  Subjective:  Patient resting quietly in bed on left side using peanut ball. Notes pain relief since epidural placement.   Family member at bedside for continuous labor support.   Denies difficulty breathing or respiratory distress, chest pain, abdominal pain, dysuria, and leg pain or swelling.   Objective:  Temp:  [97.8 F (36.6 C)-98.3 F (36.8 C)] 97.8 F (36.6 C) (03/25 1141) Pulse Rate:  [91-100] 91 (03/25 1141) Resp:  [17-18] 17 (03/25 0721) BP: (117-123)/(69-79) 117/75 (03/25 1141) SpO2:  [98 %-99 %] 99 % (03/25 1141) Weight:  [88.9 kg] 88.9 kg (03/25 0503)  Fetal Wellbeing:  Category I  UC:  irregular, every one (1) to five (5) minutes; soft resting tone  SVE:   Dilation: 4.5 Effacement (%): 100 Station: -2 Exam by:: Lawhorn CNM  Labs: Lab Results  Component Value Date   WBC 12.3 (H) 07/24/2020   HGB 10.6 (L) 07/24/2020   HCT 31.8 (L) 07/24/2020   MCV 87.8 07/24/2020   PLT 411 (H) 07/24/2020    Assessment:  Veronica Harmon is a G5P2 at 39 weeks 1 day admitted to full term premature rupture of membranes, Rh positive, GBS negative  FHR Category I  Plan:  Discussed labor augmentation options with patient. Decision for rest and pitocin augmentation in two (2) hours.   Encouraged position change and use of peanut ball.   Reviewed red flag symptoms and when to call.   Continue orders as written. Reassess as needed.    Serafina Royals, CNM [facility], [facility] 07/24/2020, 12:49 PM

## 2020-07-24 NOTE — H&P (Signed)
History and Physical   HPI  Veronica Harmon is a 30 y.o. [id number] at [redacted]w[redacted]d Estimated Date of Delivery: 07/30/20 who is being admitted for SROM @ 0215 this morning. She feels good movement, denies vaginal bleeding and is feeling irregular contractions.    OB History  OB History  Gravida Para Term Preterm AB Living  5 2 2  0 1 2  SAB IAB Ectopic Multiple Live Births  0 1 0 0 2    # Outcome Date GA Lbr Len/2nd Weight Sex Delivery Anes PTL Lv  5 Current           4 Term 12/20/12   3402 g F Vag-Spont EPI N LIV     Name: 12/22/12  3 IAB 2009          2 Term 04/19/07   3260 g M Vag-Spont EPI N LIV     Name: 04/21/07  1 Gravida             PROBLEM LIST  Pregnancy complications or risks: Patient Active Problem List   Diagnosis Date Noted  . Labor and delivery, indication for care 07/23/2020  . 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 Close exposure to COVID-19 virus 05/17/2020  . Anemia affecting pregnancy 04/30/2020  . Smoker 01/12/2020  . Type O blood, Rh positive 01/10/2020  . Family history of uterine fibroid 01/22/2019  . History of ovarian cyst 01/22/2019  . Cervical herniated disc 06/28/2018  . Anxiety 12/10/2015    Prenatal labs and studies: ABO, Rh: O/Positive/-- (09/09 1224) Antibody: Negative (09/09 1224) Rubella: 1.87 (09/09 1224) RPR: Non Reactive (12/27 1123)  HBsAg: Negative (09/09 1224)  HIV: Non Reactive (09/09 1224)  02-17-2002-- (03/03 1527)   Past Medical History:  Diagnosis Date  . Anxiety   . Chlamydia infection   . Chronic neck pain   . COVID-19 04/2019  . Depression      Past Surgical History:  Procedure Laterality Date  . CERVICAL DISC ARTHROPLASTY N/A 06/28/2018   Procedure: Cervical five-six Artificial disc replacement;  Surgeon: 06/30/2018, MD;  Location: [facility];  Service: Neurosurgery;  Laterality: N/A;  Cervical five-six Artificial disc replacement  . WISDOM TOOTH EXTRACTION       Medications    Current Discharge Medication  List    CONTINUE these medications which have NOT CHANGED   Details  acetaminophen (TYLENOL) 500 MG tablet Take 500-1,000 mg by mouth every 6 (six) hours as needed for mild pain or fever.    hydrOXYzine (ATARAX/VISTARIL) 10 MG tablet Take 1 tablet (10 mg total) by mouth 3 (three) times daily as needed. Qty: 30 tablet, Refills: 0    Iron, Ferrous Sulfate, 325 (65 Fe) MG TABS Take 1 tablet by mouth daily. Qty: 60 tablet, Refills: 1    ondansetron (ZOFRAN-ODT) 4 MG disintegrating tablet Take 1 tablet (4 mg total) by mouth every 8 (eight) hours as needed for nausea or vomiting. Qty: 20 tablet, Refills: 0    Prenatal Vit-Fe Fumarate-FA (MULTIVITAMIN-PRENATAL) 27-0.8 MG TABS tablet Take 1 tablet by mouth daily.     triamcinolone ointment (KENALOG) 0.5 % Apply 1 application topically 2 (two) times daily. Qty: 30 g, Refills: 0    zolpidem (AMBIEN) 5 MG tablet Take 5 mg by mouth at bedtime as needed for sleep.    Butalbital-APAP-Caffeine 50-325-40 MG capsule Take 2 capsules by mouth every 6 (six) hours as needed for headache. Qty: 10 capsule, Refills: 0    valACYclovir (VALTREX) 500 MG tablet  Take 1 tablet (500 mg total) by mouth 2 (two) times daily. Start at 36 weeks of pregnancy Qty: 30 tablet, Refills: 6         Allergies  Allyl isothiocyanate, Sulfa antibiotics, and Vicodin [hydrocodone-acetaminophen]  Review of Systems  Constitutional: negative Eyes: negative Ears, nose, mouth, throat, and face: negative Respiratory: negative Cardiovascular: negative Gastrointestinal: negative Genitourinary:negative Integument/breast: negative Hematologic/lymphatic: negative Musculoskeletal:negative Neurological: negative Behavioral/Psych: negative Endocrine: negative Allergic/Immunologic: negative  Physical Exam  BP 123/79 (BP Location: Left Arm)   Pulse 100   Temp 97.8 F (36.6 C) (Oral)   Resp 18   LMP 10/19/2019 Comment: spotting only  Lungs:  CTA B Cardio: RRR Abd:  Soft, gravid, NT Presentation: cephalic EXT: No C/C/ 1+ Edema DTRs: 2+ B CERVIX: Dilation: 3 Effacement (%): 50 Station: -2 Exam by:: K Allred RN  See Prenatal records for more detailed PE.     FHR:  Baseline: 130 bpm, Variability: Good {> 6 bpm), Accelerations: Reactive and Decelerations: Absent  Toco: Uterine Contractions: Frequency: Every 7-8 minutes, Duration: 50-90 seconds and Intensity: mild to moderate.  Test Results  No results found for this or any previous visit (from the past 24 hour(s)). Group B Strep negative  Assessment   G5P2012 at [redacted]w[redacted]d Estimated Date of Delivery: 07/30/20  The fetus is reassuring.   Patient Active Problem List   Diagnosis Date Noted  . Labor and delivery, indication for care 07/23/2020  . 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 Close exposure to COVID-19 virus 05/17/2020  . Anemia affecting pregnancy 04/30/2020  . Smoker 01/12/2020  . Type O blood, Rh positive 01/10/2020  . Family history of uterine fibroid 01/22/2019  . History of ovarian cyst 01/22/2019  . Cervical herniated disc 06/28/2018  . Anxiety 12/10/2015    Plan  1. Admit to L&D :   2. EFM:-- Category 1 3. Stadol or Epidural if desired.   4. Admission labs  5. Cytotec ordered  6. Dr. Logan Bores notified of pt admission 7.Anticipate NSVD.   Doreene Burke, CNM  07/24/2020 6:04 AM

## 2020-07-24 NOTE — OB Triage Note (Signed)
[facility] with complaints of LOF. Pt states that she had a gush of fluid around 0215 and it continues to leak. Pt states positive FM. Pt reports some contractions and pressure. Pt denies vaginal bleeding. Monitors applied and assessing. Initial FHT 130.

## 2020-07-24 NOTE — Progress Notes (Signed)
Patient ID: Veronica Harmon, female   DOB: 04/23/1991, 30 y.o.   MRN: [id number]  Veronica Harmon is a 30 y.o. [id number] at [redacted]w[redacted]d by ultrasound admitted for rupture of membranes  Subjective:  Patient reports pressure with contractions and mild headache, tylenol given.   Family members at bedside for continuous labor support.   Denies difficulty breathing or respiratory distress, chest pain, abdominal pain, dysuria and leg pain or swelling.   Objective:  Temp:  [97.8 F (36.6 C)-98.3 F (36.8 C)] 98.1 F (36.7 C) (03/25 1530) Pulse Rate:  [85-102] 92 (03/25 1615) Resp:  [17-18] 17 (03/25 0721) BP: (97-124)/(51-79) 119/56 (03/25 1615) SpO2:  [96 %-100 %] 100 % (03/25 1615) Weight:  [88.9 kg] 88.9 kg (03/25 0503)  Fetal Wellbeing:  Category I   UC:   regular, every two (2) to three (3) minutes; soft resting tone, pitocin 6 mu/min  SVE:   Dilation: 8 Effacement (%): 100 Station: Plus 1 Exam by:: J Lunsford RN  Labs: Lab Results  Component Value Date   WBC 12.3 (H) 07/24/2020   HGB 10.6 (L) 07/24/2020   HCT 31.8 (L) 07/24/2020   MCV 87.8 07/24/2020   PLT 411 (H) 07/24/2020    Assessment:  Veronica S Chavisis a G5P2 at 32 weeks 1 day admitted to full term premature rupture of membranes, Rh positive, GBS negative  FHR Category I  Plan:  Discussed position changes with RN, will attempt Walcher's position.   Reviewed red flag symptoms and when to call.   Continue orders as written. Reassess as needed.   Room prepared for second stage.    Veronica Harmon, CNM [facility], [facility] 07/24/2020, 5:26 PM

## 2020-07-24 NOTE — Anesthesia Procedure Notes (Addendum)
Epidural Patient location during procedure: OB Start time: 07/24/2020 11:16 AM End time: 07/24/2020 11:23 AM  Staffing Anesthesiologist: Alver Fisher, MD Resident/CRNA: Hezzie Bump, CRNA Performed: resident/CRNA   Preanesthetic Checklist Completed: patient identified, IV checked, site marked, risks and benefits discussed, surgical consent, monitors and equipment checked, pre-op evaluation and timeout performed  Epidural Patient position: sitting Prep: ChloraPrep Patient monitoring: heart rate, continuous pulse ox and blood pressure Approach: midline Location: L3-L4 Injection technique: LOR saline  Needle:  Needle type: Tuohy  Needle gauge: 17 G Needle length: 9 cm and 9 Needle insertion depth: 6 cm Catheter type: closed end flexible Catheter size: 19 Gauge Catheter at skin depth: 12 cm Test dose: negative and 1.5% lidocaine with Epi 1:200 K  Assessment Sensory level: T10 Events: blood not aspirated, injection not painful, no injection resistance, no paresthesia and negative IV test  Additional Notes 1 attempt Pt. Evaluated and documentation done after procedure finished. Patient identified. Risks/Benefits/Options discussed with patient including but not limited to bleeding, infection, nerve damage, paralysis, failed block, incomplete pain control, headache, blood pressure changes, nausea, vomiting, reactions to medication both or allergic, itching and postpartum back pain. Confirmed with bedside nurse the patient's most recent platelet count. Confirmed with patient that they are not currently taking any anticoagulation, have any bleeding history or any family history of bleeding disorders. Patient expressed understanding and wished to proceed. All questions were answered. Sterile technique was used throughout the entire procedure. Please see nursing notes for vital signs. Test dose was given through epidural catheter and negative prior to continuing to dose epidural or  start infusion. Warning signs of high block given to the patient including shortness of breath, tingling/numbness in hands, complete motor block, or any concerning symptoms with instructions to call for help. Patient was given instructions on fall risk and not to get out of bed. All questions and concerns addressed with instructions to call with any issues or inadequate analgesia.   Patient tolerated the insertion well without immediate complications.Reason for block:procedure for pain

## 2020-07-24 NOTE — Progress Notes (Signed)
Patient ID: Veronica Harmon, female   DOB: August 09, 1990, 30 y.o.   MRN: [id number]  Veronica Harmon is a 30 y.o. [id number] at [redacted]w[redacted]d by ultrasound admitted for rupture of membranes  Subjective:  Patient sitting in bed, watching television. Wishes to eat breakfast.   Denies difficulty breathing or respiratory distress, chest pain, abdominal pain, excessive vaginal bleeding, dysuria, and leg pain or swelling.   Objective:  BP 121/69 (BP Location: Right Arm)   Pulse 92   Temp 97.8 F (36.6 C) (Oral)   Resp 17   Ht 5\' 6"  (1.676 m)   Wt 88.9 kg   LMP 10/19/2019 Comment: spotting only  BMI 31.64 kg/m   Fetal Wellbeing:  Category I  UC:   regular, every two (2) to four (4) minutes  SVE:   Dilation: 3 Effacement (%): 50 Station: -2 Exam by:: K Allred RN  Labs: Lab Results  Component Value Date   WBC 12.3 (H) 07/24/2020   HGB 10.6 (L) 07/24/2020   HCT 31.8 (L) 07/24/2020   MCV 87.8 07/24/2020   PLT 411 (H) 07/24/2020    Assessment:  Veronica Harmon is a G5P2 at 39 weeks 1 day admitted to full term premature rupture of membranes, Rh positive, GBS negative  FHR Category I  Plan:  Regular diet, see orders.   Saline lock IV, see orders.   Encouraged position change and use of peanut ball.   Reviewed red flag symptoms and when to call.   Continue orders as written. Reassess as needed.    Nickie Retort, CNM [facility], [facility] 07/24/2020, 7:46 AM

## 2020-07-24 NOTE — Anesthesia Preprocedure Evaluation (Signed)
Anesthesia Evaluation  Patient identified by MRN, date of birth, ID band Patient awake    Reviewed: Allergy & Precautions, NPO status , Patient's Chart, lab work & pertinent test results  Airway Mallampati: II   Neck ROM: full    Dental no notable dental hx.    Pulmonary Current SmokerPatient did not abstain from smoking.,    Pulmonary exam normal        Cardiovascular negative cardio ROS Normal cardiovascular exam     Neuro/Psych Anxiety Depression negative neurological ROS     GI/Hepatic negative GI ROS, Neg liver ROS,   Endo/Other  negative endocrine ROS  Renal/GU negative Renal ROS  negative genitourinary   Musculoskeletal   Abdominal   Peds  Hematology  (+) Blood dyscrasia, anemia ,   Anesthesia Other Findings   Reproductive/Obstetrics (+) Pregnancy                             Anesthesia Physical Anesthesia Plan  ASA: II  Anesthesia Plan:    Post-op Pain Management:    Induction:   PONV Risk Score and Plan:   Airway Management Planned:   Additional Equipment:   Intra-op Plan:   Post-operative Plan:   Informed Consent: I have reviewed the patients History and Physical, chart, labs and discussed the procedure including the risks, benefits and alternatives for the proposed anesthesia with the patient or authorized representative who has indicated his/her understanding and acceptance.       Plan Discussed with:   Anesthesia Plan Comments:         Anesthesia Quick Evaluation

## 2020-07-25 ENCOUNTER — Inpatient Hospital Stay: Payer: Medicaid Other | Admitting: Registered Nurse

## 2020-07-25 ENCOUNTER — Encounter: Payer: Self-pay | Admitting: Certified Nurse Midwife

## 2020-07-25 ENCOUNTER — Encounter
Admission: EM | Disposition: A | Discharge status: Home / Self Care | Payer: Self-pay | Source: Home / Self Care | Attending: Certified Nurse Midwife

## 2020-07-25 DIAGNOSIS — Z302 Encounter for sterilization: Secondary | ICD-10-CM

## 2020-07-25 HISTORY — PX: TUBAL LIGATION: SHX77

## 2020-07-25 LAB — CBC
HCT: 30.2 % — ABNORMAL LOW (ref 36.0–46.0)
Hemoglobin: 9.8 g/dL — ABNORMAL LOW (ref 12.0–15.0)
MCH: 29.3 pg (ref 26.0–34.0)
MCHC: 32.5 g/dL (ref 30.0–36.0)
MCV: 90.4 fL (ref 80.0–100.0)
Platelets: 354 10*3/uL (ref 150–400)
RBC: 3.34 MIL/uL — ABNORMAL LOW (ref 3.87–5.11)
RDW: 14.1 % (ref 11.5–15.5)
WBC: 15.5 10*3/uL — ABNORMAL HIGH (ref 4.0–10.5)
nRBC: 0 % (ref 0.0–0.2)

## 2020-07-25 SURGERY — LIGATION, FALLOPIAN TUBE, POSTPARTUM
Anesthesia: General | Laterality: Bilateral

## 2020-07-25 MED ORDER — PRENATAL MULTIVITAMIN CH
1.0000 | ORAL_TABLET | Freq: Every day | ORAL | Status: DC
  Filled 2020-07-25: qty 1

## 2020-07-25 MED ORDER — FENTANYL CITRATE (PF) 100 MCG/2ML IJ SOLN
INTRAMUSCULAR | Status: DC | PRN
  Administered 2020-07-25 (×2): 25 ug via INTRAVENOUS
  Administered 2020-07-25: 50 ug via INTRAVENOUS

## 2020-07-25 MED ORDER — SUGAMMADEX SODIUM 200 MG/2ML IV SOLN
INTRAVENOUS | Status: DC | PRN
  Administered 2020-07-25: 355.6 mg via INTRAVENOUS

## 2020-07-25 MED ORDER — SODIUM CHLORIDE 0.9% FLUSH: 3.0000 mL | Freq: Two times a day (BID) | INTRAVENOUS | Status: DC

## 2020-07-25 MED ORDER — PROMETHAZINE HCL 25 MG/ML IJ SOLN: 6.2500 mg | INTRAMUSCULAR | Status: DC | PRN

## 2020-07-25 MED ORDER — METHYLERGONOVINE MALEATE 0.2 MG/ML IJ SOLN: 0.2000 mg | INTRAMUSCULAR | Status: DC | PRN

## 2020-07-25 MED ORDER — COCONUT OIL OIL: 1.0000 "application " | TOPICAL_OIL | Status: DC | PRN

## 2020-07-25 MED ORDER — METOCLOPRAMIDE HCL 10 MG PO TABS
10.0000 mg | ORAL_TABLET | Freq: Once | ORAL | Status: DC
  Filled 2020-07-25: qty 1

## 2020-07-25 MED ORDER — ONDANSETRON HCL 4 MG/2ML IJ SOLN: 4.0000 mg | INTRAMUSCULAR | Status: DC | PRN

## 2020-07-25 MED ORDER — IBUPROFEN 600 MG PO TABS
600.0000 mg | ORAL_TABLET | Freq: Four times a day (QID) | ORAL | Status: DC
  Administered 2020-07-25 – 2020-07-26 (×5): 600 mg via ORAL
  Filled 2020-07-25 (×5): qty 1

## 2020-07-25 MED ORDER — SIMETHICONE 80 MG PO CHEW
80.0000 mg | CHEWABLE_TABLET | ORAL | Status: DC | PRN
  Administered 2020-07-26: 80 mg via ORAL
  Filled 2020-07-25: qty 1

## 2020-07-25 MED ORDER — DIPHENHYDRAMINE HCL 25 MG PO CAPS: 25.0000 mg | ORAL_CAPSULE | Freq: Four times a day (QID) | ORAL | Status: DC | PRN

## 2020-07-25 MED ORDER — ROCURONIUM BROMIDE 10 MG/ML (PF) SYRINGE
PREFILLED_SYRINGE | INTRAVENOUS | Status: AC
  Filled 2020-07-25: qty 10

## 2020-07-25 MED ORDER — DEXMEDETOMIDINE (PRECEDEX) IN NS 20 MCG/5ML (4 MCG/ML) IV SYRINGE
PREFILLED_SYRINGE | INTRAVENOUS | Status: AC
  Filled 2020-07-25: qty 5

## 2020-07-25 MED ORDER — LACTATED RINGERS IV SOLN: INTRAVENOUS | Status: DC

## 2020-07-25 MED ORDER — FENTANYL CITRATE (PF) 100 MCG/2ML IJ SOLN
INTRAMUSCULAR | Status: AC
  Filled 2020-07-25: qty 2

## 2020-07-25 MED ORDER — MIDAZOLAM HCL 2 MG/2ML IJ SOLN
INTRAMUSCULAR | Status: DC | PRN
  Administered 2020-07-25: 2 mg via INTRAVENOUS

## 2020-07-25 MED ORDER — WITCH HAZEL-GLYCERIN EX PADS: 1.0000 "application " | MEDICATED_PAD | CUTANEOUS | Status: DC | PRN

## 2020-07-25 MED ORDER — SENNOSIDES-DOCUSATE SODIUM 8.6-50 MG PO TABS
2.0000 | ORAL_TABLET | ORAL | Status: DC
  Administered 2020-07-25 – 2020-07-26 (×2): 2 via ORAL
  Filled 2020-07-25 (×2): qty 2

## 2020-07-25 MED ORDER — BUPIVACAINE HCL 0.5 % IJ SOLN
INTRAMUSCULAR | Status: DC | PRN
  Administered 2020-07-25: 20 mL

## 2020-07-25 MED ORDER — DEXAMETHASONE SODIUM PHOSPHATE 10 MG/ML IJ SOLN
INTRAMUSCULAR | Status: AC
  Filled 2020-07-25: qty 1

## 2020-07-25 MED ORDER — BENZOCAINE-MENTHOL 20-0.5 % EX AERO: 1.0000 "application " | INHALATION_SPRAY | CUTANEOUS | Status: DC | PRN

## 2020-07-25 MED ORDER — FERROUS SULFATE 325 (65 FE) MG PO TABS
325.0000 mg | ORAL_TABLET | Freq: Every day | ORAL | Status: DC
  Filled 2020-07-25: qty 1

## 2020-07-25 MED ORDER — FENTANYL CITRATE (PF) 100 MCG/2ML IJ SOLN
25.0000 ug | INTRAMUSCULAR | Status: DC | PRN
  Administered 2020-07-25 (×4): 25 ug via INTRAVENOUS

## 2020-07-25 MED ORDER — DEXAMETHASONE SODIUM PHOSPHATE 10 MG/ML IJ SOLN
INTRAMUSCULAR | Status: DC | PRN
  Administered 2020-07-25: 10 mg via INTRAVENOUS

## 2020-07-25 MED ORDER — PROPOFOL 10 MG/ML IV BOLUS
INTRAVENOUS | Status: DC | PRN
  Administered 2020-07-25: 150 mg via INTRAVENOUS

## 2020-07-25 MED ORDER — ONDANSETRON HCL 4 MG/2ML IJ SOLN
INTRAMUSCULAR | Status: DC | PRN
  Administered 2020-07-25: 4 mg via INTRAVENOUS

## 2020-07-25 MED ORDER — SODIUM CHLORIDE 0.9 % IV SOLN: 250.0000 mL | INTRAVENOUS | Status: DC | PRN

## 2020-07-25 MED ORDER — SODIUM CHLORIDE 0.9% FLUSH: 3.0000 mL | INTRAVENOUS | Status: DC | PRN

## 2020-07-25 MED ORDER — PROPOFOL 10 MG/ML IV BOLUS
INTRAVENOUS | Status: AC
  Filled 2020-07-25: qty 20

## 2020-07-25 MED ORDER — LACTATED RINGERS IV SOLN: INTRAVENOUS | Status: DC | PRN

## 2020-07-25 MED ORDER — SEVOFLURANE IN SOLN
RESPIRATORY_TRACT | Status: AC
  Filled 2020-07-25: qty 250

## 2020-07-25 MED ORDER — ONDANSETRON HCL 4 MG PO TABS: 4.0000 mg | ORAL_TABLET | ORAL | Status: DC | PRN

## 2020-07-25 MED ORDER — LIDOCAINE HCL (CARDIAC) PF 100 MG/5ML IV SOSY
PREFILLED_SYRINGE | INTRAVENOUS | Status: DC | PRN
  Administered 2020-07-25: 100 mg via INTRAVENOUS

## 2020-07-25 MED ORDER — ACETAMINOPHEN 10 MG/ML IV SOLN
INTRAVENOUS | Status: DC | PRN
  Administered 2020-07-25: 1000 mg via INTRAVENOUS

## 2020-07-25 MED ORDER — METHYLERGONOVINE MALEATE 0.2 MG PO TABS: 0.2000 mg | ORAL_TABLET | ORAL | Status: DC | PRN

## 2020-07-25 MED ORDER — MIDAZOLAM HCL 2 MG/2ML IJ SOLN
INTRAMUSCULAR | Status: AC
  Filled 2020-07-25: qty 2

## 2020-07-25 MED ORDER — ONDANSETRON HCL 4 MG/2ML IJ SOLN
INTRAMUSCULAR | Status: AC
  Filled 2020-07-25: qty 2

## 2020-07-25 MED ORDER — DIBUCAINE (PERIANAL) 1 % EX OINT: 1.0000 "application " | TOPICAL_OINTMENT | CUTANEOUS | Status: DC | PRN

## 2020-07-25 MED ORDER — FAMOTIDINE 20 MG PO TABS
40.0000 mg | ORAL_TABLET | Freq: Once | ORAL | Status: DC
  Filled 2020-07-25: qty 2

## 2020-07-25 MED ORDER — ACETAMINOPHEN 325 MG PO TABS
650.0000 mg | ORAL_TABLET | ORAL | Status: DC | PRN
  Administered 2020-07-25 – 2020-07-26 (×2): 650 mg via ORAL
  Filled 2020-07-25 (×2): qty 2

## 2020-07-25 MED ORDER — ACETAMINOPHEN 10 MG/ML IV SOLN
INTRAVENOUS | Status: AC
  Filled 2020-07-25: qty 100

## 2020-07-25 MED ORDER — ROCURONIUM BROMIDE 100 MG/10ML IV SOLN
INTRAVENOUS | Status: DC | PRN
  Administered 2020-07-25: 50 mg via INTRAVENOUS

## 2020-07-25 MED ORDER — OXYCODONE-ACETAMINOPHEN 5-325 MG PO TABS
1.0000 | ORAL_TABLET | Freq: Four times a day (QID) | ORAL | Status: DC | PRN
  Administered 2020-07-25 – 2020-07-26 (×3): 1 via ORAL
  Filled 2020-07-25 (×3): qty 1

## 2020-07-25 SURGICAL SUPPLY — 27 items
ADH SKN CLS APL DERMABOND .7 (GAUZE/BANDAGES/DRESSINGS) ×1
APL PRP STRL LF DISP 70% ISPRP (MISCELLANEOUS) ×1
BLADE SURG SZ11 CARB STEEL (BLADE) ×2 IMPLANT
CHLORAPREP W/TINT 26 (MISCELLANEOUS) ×2 IMPLANT
DERMABOND ADVANCED (GAUZE/BANDAGES/DRESSINGS) ×1
DERMABOND ADVANCED .7 DNX12 (GAUZE/BANDAGES/DRESSINGS) ×1 IMPLANT
DRAPE LAPAROTOMY 100X77 ABD (DRAPES) ×2 IMPLANT
GLOVE SURG ENC MOIS LTX SZ6.5 (GLOVE) ×2 IMPLANT
GLOVE SURG UNDER LTX SZ7 (GLOVE) ×2 IMPLANT
GOWN STRL REUS W/ TWL LRG LVL3 (GOWN DISPOSABLE) ×2 IMPLANT
GOWN STRL REUS W/TWL LRG LVL3 (GOWN DISPOSABLE) ×4
KIT TURNOVER CYSTO (KITS) ×2 IMPLANT
MANIFOLD NEPTUNE II (INSTRUMENTS) ×2 IMPLANT
NDL HYPO 25GX1X1/2 BEV (NEEDLE) ×1 IMPLANT
NEEDLE HYPO 25GX1X1/2 BEV (NEEDLE) ×2 IMPLANT
NS IRRIG 500ML POUR BTL (IV SOLUTION) ×2 IMPLANT
PACK BASIN MINOR ARMC (MISCELLANEOUS) ×2 IMPLANT
SUT MNCRL 4-0 (SUTURE) ×2
SUT MNCRL 4-0 27XMFL (SUTURE) ×1
SUT PLAIN GUT 0 (SUTURE) ×4 IMPLANT
SUT VIC AB 0 CT1 36 (SUTURE) IMPLANT
SUT VIC AB 0 SH 27 (SUTURE) IMPLANT
SUT VIC AB 3-0 SH 27 (SUTURE)
SUT VIC AB 3-0 SH 27X BRD (SUTURE) ×1 IMPLANT
SUT VICRYL 0 AB UR-6 (SUTURE) ×4 IMPLANT
SUTURE MNCRL 4-0 27XMF (SUTURE) ×1 IMPLANT
SYR 10ML LL (SYRINGE) ×2 IMPLANT

## 2020-07-25 NOTE — Anesthesia Postprocedure Evaluation (Signed)
Anesthesia Post Note  Patient: Veronica Harmon  Procedure(s) Performed: POST PARTUM TUBAL LIGATION (Bilateral )  Patient location during evaluation: PACU Anesthesia Type: General Level of consciousness: awake and alert Pain management: pain level controlled Vital Signs Assessment: post-procedure vital signs reviewed and stable Respiratory status: spontaneous breathing, nonlabored ventilation, respiratory function stable and patient connected to nasal cannula oxygen Cardiovascular status: blood pressure returned to baseline and stable Postop Assessment: no apparent nausea or vomiting Anesthetic complications: no   No complications documented.   Last Vitals:  Vitals:   07/25/20 1115 07/25/20 1141  BP: 103/84 119/82  Pulse: 90 87  Resp: 15 18  Temp: 36.7 C (!) 36.4 C  SpO2: 99% 100%    Last Pain:  Vitals:   07/25/20 1115  TempSrc:   PainSc: 4                  Lenard Simmer

## 2020-07-25 NOTE — Anesthesia Postprocedure Evaluation (Signed)
Anesthesia Post Note  Patient: Veronica Harmon  Procedure(s) Performed: AN AD HOC LABOR EPIDURAL  Patient location during evaluation: Mother Baby Anesthesia Type: Epidural Level of consciousness: awake and alert Pain management: pain level controlled Vital Signs Assessment: post-procedure vital signs reviewed and stable Respiratory status: spontaneous breathing, nonlabored ventilation and respiratory function stable Cardiovascular status: stable Postop Assessment: no headache, able to ambulate and adequate PO intake Anesthetic complications: no   No complications documented.   Last Vitals:  Vitals:   07/25/20 1115 07/25/20 1141  BP: 103/84 119/82  Pulse: 90 87  Resp: 15 18  Temp: 36.7 C (!) 36.4 C  SpO2: 99% 100%    Last Pain:  Vitals:   07/25/20 1115  TempSrc:   PainSc: 4                  Lenard Simmer

## 2020-07-25 NOTE — Progress Notes (Signed)
Patient ID: Veronica Harmon, female   DOB: Mar 07, 1991, 30 y.o.   MRN: [id number]  Post Partum Day # 1, s/p spontaneous vaginal birth, Rh positive, GBS negative, Breastfeeding  Subjective:  Patient sitting in bed, eating applesauce. FOB at bedside for continuous support. Infant sleeping at bedside in bassinet.   Denies difficulty breathing or respiratory distress, chest pain, abdominal pain, excessive vaginal bleeding, dysuria, and leg pain.   Objective: Temp:  [96.9 F (36.1 C)-98.1 F (36.7 C)] 97.5 F (36.4 C) (03/26 1141) Pulse Rate:  [78-103] 87 (03/26 1141) Resp:  [10-20] 18 (03/26 1141) BP: (97-136)/(51-84) 119/82 (03/26 1141) SpO2:  [96 %-100 %] 100 % (03/26 1141)  Physical Exam:   General: alert and cooperative   Lungs: clear to auscultation bilaterally  Breasts: deferred, no complaints  Heart: normal apical impulse  Abdomen: soft, non-tender; bowel sounds normal; no masses,  no organomegaly; incision clean, dry and intact  Pelvis: Lochia: appropriate, Uterine Fundus: firm  Extremities: DVT Evaluation: no evidence of DVT seen on physical exam.  Recent Labs    07/24/20 0607 07/25/20 0723  HGB 10.6* 9.8*  HCT 31.8* 30.2*    Assessment:  30 year old female G5P3, Post Partum Day # 1, s/p spontaneous vaginal birth, Rh positive, GBS negative  Breastfeeding  Plan:  Routine postpartum care and education.   Reviewed red flag symptoms and when to call.   Lactation consult as needed.   Continue orders as written. Reassess as needed.   Anticipated discharge tomorrow.    LOS: 1 day   Serafina Royals, CNM [facility], [facility] 07/25/2020 12:50 PM

## 2020-07-25 NOTE — Lactation Note (Signed)
This note was copied from a baby's chart. Lactation Consultation Note  Patient Name: Boy Christen Wardrop [id number] Date: 07/25/2020 Reason for consult: Follow-up assessment Age:32 hours Lactation to the room for initial visit. Mother has gone for BTL surgery. FOB taught how to spoon feed colostrum since baby is gaggy and not wanting to suck finger to take milk from syringe.   Mother called LC back to room when she was back. She is wanting to learn HE, since "she is not able to express easily". Encouraged feeding on demand and with cues. If baby is not cueing encouraged hand expression and skin to skin. Taught proper technique for hand expression and spoon feeding. LC and Mother expressed and fed to baby. Baby tolerated spoon feed well. Baby was placed in bassinet, Mother is wanting to nap. Encouraged 8 or more attempts in the first 24 hours and 8 or more good feeds after 24 HOL. Reviewed appropriate diapers for days of life and How to know your baby is getting enough to eat. Reviewed "Understanding Postpartum and Newborn Care" INJOY booklet at bedside. [facility] # left on board, encouraged to call for any assistance. Mother has no further questions at this time.    Maternal Data Has patient been taught Hand Expression?: Yes  Feeding Mother's Current Feeding Choice: Breast Milk   Interventions Interventions: Breast feeding basics reviewed;Assisted with latch;Hand express;Education  Discharge Pump: Personal  Consult Status Consult Status: Follow-up Date: 07/26/20 Follow-up type: Call as needed    Emmerson Shuffield D Evellyn Tuff 07/25/2020, 4:41 PM

## 2020-07-25 NOTE — Op Note (Signed)
Procedure(s): POST PARTUM TUBAL LIGATION Procedure Note  Veronica Harmon female 30 y.o. 07/25/2020  Indications: The patient is a 30 y.o. [id number] multiparous female s/p NSVD, with undesired fertility.   Pre-operative Diagnosis: Multiparous female who is postpartum Day #1 s/p NSVD, undesired fertility  Post-operative Diagnosis: Same  Surgeon: Hildred Laser, MD  Assistants:  Surgical scrub tech  Anesthesia: General endotracheal anesthesia  Findings:  Normal uterus, tubes, and ovaries.  Procedure Details: The patient was seen in the Holding Room. The risks, benefits, complications, treatment options, and expected outcomes were discussed with the patient.  The patient concurred with the proposed plan, giving informed consent.  The site of surgery properly noted/marked. The patient was taken to the Operating Room, identified as Veronica Harmon and the procedure verified as Procedure(s) (LRB): POST PARTUM TUBAL LIGATION (Bilateral).   She was then placed under general anesthesia without difficulty. She was placed in the supine position, and was prepped and draped in a sterile manner.  A Time Out was held and the above information confirmed.  Attention was turned to the patient's abdomen, where 10 ml of 0.5% Marcaine was administered at the umbilical fold for  local analgesia.  A small transverse skin incision was made under the umbilical fold. The incision was taken down to the layer of fascia using the scalpel, and fascia was incised, and extended bilaterally using the bovie. The peritoneum was entered in a sharp fashion. Attention was then turned to the patient's uterus, and right fallopian tube was identified and followed out to the fimbriated end.  The Babcock clamp was then used to grasp the tube approximately 4 cm from the cornual region.  A 3 cm segment of tube was then ligated with a free tie of 0-Chromic using the [place] method and excised.  The left fallopian tube was then ligated  in a similar fashion and excised. The tubal lumens were cauterized bilaterally.  Good hemostasis was noted with bilateral fallopian tubes. The instruments were then removed from the patient's abdomen and the fascial incision was repaired with 0 Vicryl, and injected with an additional 10 ml of the local analgesia.  The skin was closed with a 4-0 Monocryl subcuticular stitch. Dermabond was placed over the incision site. The patient tolerated the procedure well.  Instrument, sponge, and needle counts were correct times three.  The patient was then taken to the recovery room awake and in stable condition.   Estimated Blood Loss:  minimal      Drains: None.  Patient voided prior to procedure         Total IV Fluids:  400 ml  Specimens: Segments of bilateral fallopian tubes         Implants: None         Complications:  None; patient tolerated the procedure well.         Disposition: PACU - hemodynamically stable.         Condition: stable   Hildred Laser, MD [facility]

## 2020-07-25 NOTE — Anesthesia Procedure Notes (Signed)
Procedure Name: Intubation Date/Time: 07/25/2020 9:40 AM Performed by: Doreen Salvage, CRNA Pre-anesthesia Checklist: Patient identified, Emergency Drugs available, Suction available and Patient being monitored Patient Re-evaluated:Patient Re-evaluated prior to induction Oxygen Delivery Method: Circle system utilized Preoxygenation: Pre-oxygenation with 100% oxygen Induction Type: IV induction, Cricoid Pressure applied and Rapid sequence Ventilation: Mask ventilation without difficulty Laryngoscope Size: Mac and 3 Grade View: Grade II Tube type: Oral Tube size: 7.0 mm Number of attempts: 1 Airway Equipment and Method: Stylet Placement Confirmation: ETT inserted through vocal cords under direct vision,  positive ETCO2 and breath sounds checked- equal and bilateral Secured at: 19 cm Tube secured with: Tape Dental Injury: Teeth and Oropharynx as per pre-operative assessment

## 2020-07-25 NOTE — Anesthesia Preprocedure Evaluation (Signed)
Anesthesia Evaluation  Patient identified by MRN, date of birth, ID band Patient awake    Reviewed: Allergy & Precautions, NPO status , Patient's Chart, lab work & pertinent test results  History of Anesthesia Complications Negative for: history of anesthetic complications  Airway Mallampati: I   Neck ROM: full    Dental  (+) Teeth Intact, Dental Advidsory Given   Pulmonary neg shortness of breath, neg COPD, neg recent URI, Current SmokerPatient did not abstain from smoking.,    Pulmonary exam normal        Cardiovascular Exercise Tolerance: Good negative cardio ROS Normal cardiovascular exam     Neuro/Psych PSYCHIATRIC DISORDERS Anxiety Depression negative neurological ROS     GI/Hepatic Neg liver ROS, GERD  ,  Endo/Other  negative endocrine ROS  Renal/GU negative Renal ROS  negative genitourinary   Musculoskeletal   Abdominal   Peds  Hematology  (+) Blood dyscrasia, anemia ,   Anesthesia Other Findings Past Medical History: No date: Anxiety No date: Chlamydia infection No date: Chronic neck pain 04/2019: COVID-19 No date: Depression   Reproductive/Obstetrics negative OB ROS                             Anesthesia Physical  Anesthesia Plan  ASA: II  Anesthesia Plan: General   Post-op Pain Management:    Induction: Intravenous  PONV Risk Score and Plan: 2 and Ondansetron, Dexamethasone, Midazolam and Treatment may vary due to age or medical condition  Airway Management Planned: Oral ETT  Additional Equipment:   Intra-op Plan:   Post-operative Plan: Extubation in OR  Informed Consent: I have reviewed the patients History and Physical, chart, labs and discussed the procedure including the risks, benefits and alternatives for the proposed anesthesia with the patient or authorized representative who has indicated his/her understanding and acceptance.       Plan  Discussed with:   Anesthesia Plan Comments:         Anesthesia Quick Evaluation

## 2020-07-25 NOTE — Transfer of Care (Signed)
Immediate Anesthesia Transfer of Care Note  Patient: Veronica Harmon  Procedure(s) Performed: Procedure(s): POST PARTUM TUBAL LIGATION (Bilateral)  Patient Location: PACU  Anesthesia Type:General  Level of Consciousness: sedated  Airway & Oxygen Therapy: Patient Spontanous Breathing and Patient connected to face mask oxygen  Post-op Assessment: Report given to RN and Post -op Vital signs reviewed and stable  Post vital signs: Reviewed and stable  Last Vitals:  Vitals:   07/25/20 0759 07/25/20 1035  BP: 117/80   Pulse: 86   Resp: 20   Temp: 36.6 C 36.6 C  SpO2: 99%     Complications: No apparent anesthesia complications

## 2020-07-25 NOTE — Progress Notes (Signed)
OBSTETRICS AND GYNECOLOGY PRE-OPERATIVE NOTE   Pre-Op Diagnosis: Multiparity, desiring permanent sterilization  Planned Procedure: Postpartum tubal ligation  Surgeons: Hildred Laser, MD  Anesthesia: General  Blood: Type and screened, O+/-  Labs:  CBC Latest Ref Rng & Units 07/25/2020 07/24/2020 04/27/2020  WBC 4.0 - 10.5 K/uL 15.5(H) 12.3(H) 11.5(H)  Hemoglobin 12.0 - 15.0 g/dL 5.1(W) 10.6(L) 10.6(L)  Hematocrit 36.0 - 46.0 % 30.2(L) 31.8(L) 30.8(L)  Platelets 150 - 400 K/uL 354 411(H) 324    Antibiotics: None Needed   Consent: Signed and on chart. Medicaid sterilization form also previously completed, dates compliant.    Pre-Op BTL Consent Consent: Surgical consent and tubal sterilization. Alternatives to sterilizations are documented on the surgical consent that has been signed by the patient. Patient confirms that she has previously been counseled about permanent sterilization on mutiple occasions during her antepartum course and during this admission. She understands the alternatives to permanents include: oral contraceptive pills, depot provera injection, patch, ring, intrauterine device, Nexplanon, condoms, natural fertility awareness methods, and cervical caps/diaphram. She states that she desires the procedure. She has been NPO since midnight for her procedure.  To OR when ready. Anesthesia aware.    Hildred Laser, MD [facility]

## 2020-07-25 NOTE — Anesthesia Post-op Follow-up Note (Signed)
  Anesthesia Pain Follow-up Note  Patient: RANEEN JAFFER  Day #: 1  Date of Follow-up: 07/25/2020 Time: 12:49 PM  Last Vitals:  Vitals:   07/25/20 1115 07/25/20 1141  BP: 103/84 119/82  Pulse: 90 87  Resp: 15 18  Temp: 36.7 C (!) 36.4 C  SpO2: 99% 100%    Level of Consciousness: alert  Pain: mild   Side Effects:None  Catheter Site Exam:clean, dry     Plan: D/C from anesthesia care at surgeon's request  Lenard Simmer

## 2020-07-26 ENCOUNTER — Encounter: Payer: Self-pay | Admitting: Obstetrics and Gynecology

## 2020-07-26 DIAGNOSIS — Z3A39 39 weeks gestation of pregnancy: Secondary | ICD-10-CM

## 2020-07-26 DIAGNOSIS — O4200 Premature rupture of membranes, onset of labor within 24 hours of rupture, unspecified weeks of gestation: Secondary | ICD-10-CM

## 2020-07-26 MED ORDER — IBUPROFEN 600 MG PO TABS
600.0000 mg | ORAL_TABLET | Freq: Four times a day (QID) | ORAL | Status: DC
  Administered 2020-07-26: 600 mg via ORAL
  Filled 2020-07-26: qty 1

## 2020-07-26 MED ORDER — IBUPROFEN 600 MG PO TABS: 600.0000 mg | ORAL_TABLET | Freq: Four times a day (QID) | ORAL | 0 refills | Status: DC

## 2020-07-26 MED ORDER — OXYCODONE-ACETAMINOPHEN 5-325 MG PO TABS: 1.0000 | ORAL_TABLET | Freq: Four times a day (QID) | ORAL | 0 refills | Status: DC | PRN

## 2020-07-26 NOTE — Discharge Summary (Signed)
Postpartum Discharge Summary      Patient Name: Veronica Harmon DOB: 1991/05/01 MRN: [id number]  Date of admission: 07/24/2020 Delivery date:07/24/2020  Delivering provider: Diona Fanti  Date of discharge: 07/26/2020  Admitting diagnosis: Labor and delivery, indication for care [O75.9] Intrauterine pregnancy: [redacted]w[redacted]d    Secondary diagnosis:  Principal Problem:   PROM with onset of labor within 24 hours of rupture Active Problems:   Anemia affecting pregnancy   HSV infection   Labor and delivery, indication for care   Vaginal delivery   [redacted] weeks gestation of pregnancy  Additional problems: History of COVID infection in pregnancy    Discharge diagnosis: Term Pregnancy Delivered and Anemia                                              Post partum procedures:postpartum tubal ligation Augmentation: Pitocin and Cytotec Complications: None  Hospital course: Induction of Labor With Vaginal Delivery   30y.o. yo [id number] 32w1das admitted to the hospital 07/24/2020 for induction of labor.  Indication for induction: PROM.  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 Patient had an uncomplicated labor course as follows: Membrane Rupture Time/Date: 2:00 AM ,07/24/2020   Delivery Method:Vaginal, Spontaneous  Episiotomy: None  Lacerations:  None  Details of delivery can be found in separate delivery note.  Patient had a routine postpartum course. Patient is discharged home 07/26/20.  Newborn Data: Birth date:07/24/2020  Birth time:6:13 PM  Gender:Female  Living status:Living  Apgars:9 ,9  Weight:3880 g   Magnesium Sulfate received: No BMZ received: No Rhophylac:No MMR:No T-DaP:Given prenatally Flu: No  (declined) Transfusion:No  Physical exam  Vitals:   07/25/20 2017 07/25/20 2248 07/26/20 0331 07/26/20 0817  BP: 120/75 120/78 119/75 117/74  Pulse: 83 94 81 79  Resp: _0 Temp: 98.3 F (36.8 C) 98.3 F (36.8 C) 98.1 F (36.7 C) 98.2 F (36.8 C)   TempSrc:    Oral  SpO2: 99% 98% 99% 99%  Weight:      Height:       General: alert, cooperative and no distress Lochia: appropriate Uterine Fundus: firm Incision: Healing well with no significant drainage, No significant erythema, Dressing is clean, dry, and intact DVT Evaluation: No evidence of DVT seen on physical exam. Negative Homan's sign. No cords or calf tenderness. No significant calf/ankle edema.   Labs: Lab Results  Component Value Date   WBC 15.5 (H) 07/25/2020   HGB 9.8 (L) 07/25/2020   HCT 30.2 (L) 07/25/2020   MCV 90.4 07/25/2020   PLT 354 07/25/2020   CMP Latest Ref Rng & Units 06/11/2020  Glucose 65 - 99 mg/dL 104(H)  BUN 6 - 20 mg/dL 5(L)  Creatinine 0.57 - 1.00 mg/dL 0.45(L)  Sodium 134 - 144 mmol/L 139  Potassium 3.5 - 5.2 mmol/L 4.1  Chloride 96 - 106 mmol/L 105  CO2 20 - 29 mmol/L 17(L)  Calcium 8.7 - 10.2 mg/dL 8.8  Total Protein 6.0 - 8.5 g/dL 5.8(L)  Total Bilirubin 0.0 - 1.2 mg/dL <0.2  Alkaline Phos 44 - 121 IU/L 137(H)  AST 0 - 40 IU/L 15  ALT 0 - 32 IU/L 10   Edinburgh Score: No flowsheet data found.    After visit meds:  Allergies as of 07/26/2020      Reactions   Allyl Isothiocyanate  Hives   Sulfa Antibiotics Other (See Comments)   UNKNOWN    Vicodin [hydrocodone-acetaminophen] Itching, Nausea And Vomiting, Other (See Comments)   feels like something crawling.      Medication List    STOP taking these medications   Butalbital-APAP-Caffeine 50-325-40 MG capsule   hydrOXYzine 10 MG tablet Commonly known as: ATARAX/VISTARIL   ondansetron 4 MG disintegrating tablet Commonly known as: ZOFRAN-ODT   zolpidem 5 MG tablet Commonly known as: AMBIEN     TAKE these medications   acetaminophen 500 MG tablet Commonly known as: TYLENOL Take 500-1,000 mg by mouth every 6 (six) hours as needed for mild pain or fever.   ibuprofen 600 MG tablet Commonly known as: ADVIL Take 1 tablet (600 mg total) by mouth every 6 (six) hours.    Iron (Ferrous Sulfate) 325 (65 Fe) MG Tabs Take 1 tablet by mouth daily.   multivitamin-prenatal 27-0.8 MG Tabs tablet Take 1 tablet by mouth daily.   oxyCODONE-acetaminophen 5-325 MG tablet Commonly known as: PERCOCET/ROXICET Take 1-2 tablets by mouth every 6 (six) hours as needed for moderate pain or severe pain.   triamcinolone ointment 0.5 % Commonly known as: KENALOG Apply 1 application topically 2 (two) times daily.   valACYclovir 500 MG tablet Commonly known as: VALTREX Take 1 tablet (500 mg total) by mouth 2 (two) times daily. Start at 36 weeks of pregnancy        Discharge home in stable condition Infant Feeding: Breast Infant Disposition:home with mother Discharge instruction: per After Visit Summary and Postpartum booklet. Activity: Advance as tolerated. Pelvic rest for 6 weeks.  Diet: routine diet Anticipated Birth Control: BTL done PP Postpartum Appointment:2 weeks Additional Postpartum F/U: Postpartum Depression checkup Future Appointments: Future Appointments  Date Time Provider Department Center  07/27/2020  1:45 PM Lawhorn, Jenkins Michelle, CNM [facility] None   Follow up Visit:  Follow-up Information    Lawhorn, Jenkins Michelle, CNM Follow up.   Specialties: Certified Nurse Midwife, Obstetrics and Gynecology, Radiology Why: Someone from the office will call you to schedule a two (2) week TELEVISIT and six (6) week POSTPARTUM VISIT with JML Contact information: [address] [place] [address] [phone]                   07/26/2020  , MD  [facility]  

## 2020-07-26 NOTE — Lactation Note (Addendum)
This note was copied from a baby's chart. Lactation Consultation Note  Patient Name: Veronica Harmon SHFWY'O Date: 07/26/2020 Reason for consult: Follow-up assessment;Term Age:32 hours  LC student provided consult follow up.  Infant was feeding at R breast in cradle position following circumcision. Latch looked good. Mother wants to ensure that she is latching infant correctly. Infant popped off so LC student was able to see infant latch. Reminded mother to keep infant tummy to tummy and cheek to breast. Mother was able to correct position unassisted. Infant had few audile swallows with stimulation, he came off the breast after 15 minutes, over all good feeding.   LC student reviewed INJOY booklet. Reviewed the importance and benefits of STS, I/Os, infant behavior and feeding cues, breast and nipple care, and position options. Parents aware of outpatient lactation support.  Plan: - Contineu to feed infant 8-12x in 24 hour period, following feeding cues.  - Continue STS    Feeding Mother's Current Feeding Choice: Breast Milk  LATCH Score Latch: Repeated attempts needed to sustain latch, nipple held in mouth throughout feeding, stimulation needed to elicit sucking reflex.  Audible Swallowing: A few with stimulation  Type of Nipple: Everted at rest and after stimulation  Comfort (Breast/Nipple): Soft / non-tender  Hold (Positioning): No assistance needed to correctly position infant at breast.  LATCH Score: 8   Lactation Tools Discussed/Used    Interventions Interventions: Breast feeding basics reviewed;Skin to skin;Education  Discharge Discharge Education: Engorgement and breast care;Warning signs for feeding baby  Consult Status Consult Status: Complete    Zola Button 07/26/2020, 11:51 AM

## 2020-07-26 NOTE — Progress Notes (Signed)
Reviewed D/C instructions with pt and family. Pt verbalized understanding of teaching. Discharged to home via W/C. Pt to schedule f/u appt.  

## 2020-07-26 NOTE — Discharge Instructions (Signed)
Breastfeeding  Choosing to breastfeed is one of the best decisions you can make for yourself and your baby. A change in hormones during pregnancy causes your breasts to make breast milk in your milk-producing glands. Hormones prevent breast milk from being released before your baby is born. They also prompt milk flow after birth. Once breastfeeding has begun, thoughts of your baby, as well as his or her sucking or crying, can stimulate the release of milk from your milk-producing glands. Benefits of breastfeeding Research shows that breastfeeding offers many health benefits for infants and mothers. It also offers a cost-free and convenient way to feed your baby. For your baby  Your first milk (colostrum) helps your baby's digestive system to function better.  Special cells in your milk (antibodies) help your baby to fight off infections.  Breastfed babies are less likely to develop asthma, allergies, obesity, or type 2 diabetes. They are also at lower risk for sudden infant death syndrome (SIDS).  Nutrients in breast milk are better able to meet your baby's needs compared to infant formula.  Breast milk improves your baby's brain development. For you  Breastfeeding helps to create a very special bond between you and your baby.  Breastfeeding is convenient. Breast milk costs nothing and is always available at the correct temperature.  Breastfeeding helps to burn calories. It helps you to lose the weight that you gained during pregnancy.  Breastfeeding makes your uterus return faster to its size before pregnancy. It also slows bleeding (lochia) after you give birth.  Breastfeeding helps to lower your risk of developing type 2 diabetes, osteoporosis, rheumatoid arthritis, cardiovascular disease, and breast, ovarian, uterine, and endometrial cancer later in life. Breastfeeding basics Starting breastfeeding  Find a comfortable place to sit or lie down, with your neck and back  well-supported.  Place a pillow or a rolled-up blanket under your baby to bring him or her to the level of your breast (if you are seated). Nursing pillows are specially designed to help support your arms and your baby while you breastfeed.  Make sure that your baby's tummy (abdomen) is facing your abdomen.  Gently massage your breast. With your fingertips, massage from the outer edges of your breast inward toward the nipple. This encourages milk flow. If your milk flows slowly, you may need to continue this action during the feeding.  Support your breast with 4 fingers underneath and your thumb above your nipple (make the letter "C" with your hand). Make sure your fingers are well away from your nipple and your baby's mouth.  Stroke your baby's lips gently with your finger or nipple.  When your baby's mouth is open wide enough, quickly bring your baby to your breast, placing your entire nipple and as much of the areola as possible into your baby's mouth. The areola is the colored area around your nipple. ? More areola should be visible above your baby's upper lip than below the lower lip. ? Your baby's lips should be opened and extended outward (flanged) to ensure an adequate, comfortable latch. ? Your baby's tongue should be between his or her lower gum and your breast.  Make sure that your baby's mouth is correctly positioned around your nipple (latched). Your baby's lips should create a seal on your breast and be turned out (everted).  It is common for your baby to suck about 2-3 minutes in order to start the flow of breast milk. Latching Teaching your baby how to latch onto your breast properly is  very important. An improper latch can cause nipple pain, decreased milk supply, and poor weight gain in your baby. Also, if your baby is not latched onto your nipple properly, he or she may swallow some air during feeding. This can make your baby fussy. Burping your baby when you switch breasts  during the feeding can help to get rid of the air. However, teaching your baby to latch on properly is still the best way to prevent fussiness from swallowing air while breastfeeding. Signs that your baby has successfully latched onto your nipple  Silent tugging or silent sucking, without causing you pain. Infant's lips should be extended outward (flanged).  Swallowing heard between every 3-4 sucks once your milk has started to flow (after your let-down milk reflex occurs).  Muscle movement above and in front of his or her ears while sucking. Signs that your baby has not successfully latched onto your nipple  Sucking sounds or smacking sounds from your baby while breastfeeding.  Nipple pain. If you think your baby has not latched on correctly, slip your finger into the corner of your baby's mouth to break the suction and place it between your baby's gums. Attempt to start breastfeeding again. Signs of successful breastfeeding Signs from your baby  Your baby will gradually decrease the number of sucks or will completely stop sucking.  Your baby will fall asleep.  Your baby's body will relax.  Your baby will retain a small amount of milk in his or her mouth.  Your baby will let go of your breast by himself or herself. Signs from you  Breasts that have increased in firmness, weight, and size 1-3 hours after feeding.  Breasts that are softer immediately after breastfeeding.  Increased milk volume, as well as a change in milk consistency and color by the fifth day of breastfeeding.  Nipples that are not sore, cracked, or bleeding. Signs that your baby is getting enough milk  Wetting at least 1-2 diapers during the first 24 hours after birth.  Wetting at least 5-6 diapers every 24 hours for the first week after birth. The urine should be clear or pale yellow by the age of 5 days.  Wetting 6-8 diapers every 24 hours as your baby continues to grow and develop.  At least 3 stools in  a 24-hour period by the age of 5 days. The stool should be soft and yellow.  At least 3 stools in a 24-hour period by the age of 7 days. The stool should be seedy and yellow.  No loss of weight greater than 10% of birth weight during the first 3 days of life.  Average weight gain of 4-7 oz (113-198 g) per week after the age of 4 days.  Consistent daily weight gain by the age of 5 days, without weight loss after the age of 2 weeks. After a feeding, your baby may spit up a small amount of milk. This is normal. Breastfeeding frequency and duration Frequent feeding will help you make more milk and can prevent sore nipples and extremely full breasts (breast engorgement). Breastfeed when you feel the need to reduce the fullness of your breasts or when your baby shows signs of hunger. This is called "breastfeeding on demand." Signs that your baby is hungry include:  Increased alertness, activity, or restlessness.  Movement of the head from side to side.  Opening of the mouth when the corner of the mouth or cheek is stroked (rooting).  Increased sucking sounds, smacking lips, cooing,   sighing, or squeaking.  Hand-to-mouth movements and sucking on fingers or hands.  Fussing or crying. Avoid introducing a pacifier to your baby in the first 4-6 weeks after your baby is born. After this time, you may choose to use a pacifier. Research has shown that pacifier use during the first year of a baby's life decreases the risk of sudden infant death syndrome (SIDS). Allow your baby to feed on each breast as long as he or she wants. When your baby unlatches or falls asleep while feeding from the first breast, offer the second breast. Because newborns are often sleepy in the first few weeks of life, you may need to awaken your baby to get him or her to feed. Breastfeeding times will vary from baby to baby. However, the following rules can serve as a guide to help you make sure that your baby is properly  fed:  Newborns (babies 9 weeks of age or younger) may breastfeed every 1-3 hours.  Newborns should not go without breastfeeding for longer than 3 hours during the day or 5 hours during the night.  You should breastfeed your baby a minimum of 8 times in a 24-hour period. Breast milk pumping Pumping and storing breast milk allows you to make sure that your baby is exclusively fed your breast milk, even at times when you are unable to breastfeed. This is especially important if you go back to work while you are still breastfeeding, or if you are not able to be present during feedings. Your lactation consultant can help you find a method of pumping that works best for you and give you guidelines about how long it is safe to store breast milk.      Caring for your breasts while you breastfeed Nipples can become dry, cracked, and sore while breastfeeding. The following recommendations can help keep your breasts moisturized and healthy:  Avoid using soap on your nipples.  Wear a supportive bra designed especially for nursing. Avoid wearing underwire-style bras or extremely tight bras (sports bras).  Air-dry your nipples for 3-4 minutes after each feeding.  Use only cotton bra pads to absorb leaked breast milk. Leaking of breast milk between feedings is normal.  Use lanolin on your nipples after breastfeeding. Lanolin helps to maintain your skin's normal moisture barrier. Pure lanolin is not harmful (not toxic) to your baby. You may also hand express a few drops of breast milk and gently massage that milk into your nipples and allow the milk to air-dry. In the first few weeks after giving birth, some women experience breast engorgement. Engorgement can make your breasts feel heavy, warm, and tender to the touch. Engorgement peaks within 3-5 days after you give birth. The following recommendations can help to ease engorgement:  Completely empty your breasts while breastfeeding or pumping. You may  want to start by applying warm, moist heat (in the shower or with warm, water-soaked hand towels) just before feeding or pumping. This increases circulation and helps the milk flow. If your baby does not completely empty your breasts while breastfeeding, pump any extra milk after he or she is finished.  Apply ice packs to your breasts immediately after breastfeeding or pumping, unless this is too uncomfortable for you. To do this: ? Put ice in a plastic bag. ? Place a towel between your skin and the bag. ? Leave the ice on for 20 minutes, 2-3 times a day.  Make sure that your baby is latched on and positioned properly while breastfeeding.  If engorgement persists after 48 hours of following these recommendations, contact your health care provider or a Advertising [employer]. Overall health care recommendations while breastfeeding  Eat 3 healthy meals and 3 snacks every day. Well-nourished mothers who are breastfeeding need an additional 450-500 calories a day. You can meet this requirement by increasing the amount of a balanced diet that you eat.  Drink enough water to keep your urine pale yellow or clear.  Rest often, relax, and continue to take your prenatal vitamins to prevent fatigue, stress, and low vitamin and mineral levels in your body (nutrient deficiencies).  Do not use any products that contain nicotine or tobacco, such as cigarettes and e-cigarettes. Your baby may be harmed by chemicals from cigarettes that pass into breast milk and exposure to secondhand smoke. If you need help quitting, ask your health care provider.  Avoid alcohol.  Do not use illegal drugs or marijuana.  Talk with your health care provider before taking any medicines. These include over-the-counter and prescription medicines as well as vitamins and herbal supplements. Some medicines that may be harmful to your baby can pass through breast milk.  It is possible to become pregnant while breastfeeding. If birth  control is desired, ask your health care provider about options that will be safe while breastfeeding your baby. Where to find more information: [employer]: www.llli.org Contact a health care provider if:  You feel like you want to stop breastfeeding or have become frustrated with breastfeeding.  Your nipples are cracked or bleeding.  Your breasts are red, tender, or warm.  You have: ? Painful breasts or nipples. ? A swollen area on either breast. ? A fever or chills. ? Nausea or vomiting. ? Drainage other than breast milk from your nipples.  Your breasts do not become full before feedings by the fifth day after you give birth.  You feel sad and depressed.  Your baby is: ? Too sleepy to eat well. ? Having trouble sleeping. ? More than 66 week old and wetting fewer than 6 diapers in a 24-hour period. ? Not gaining weight by 16 days of age.  Your baby has fewer than 3 stools in a 24-hour period.  Your baby's skin or the white parts of his or her eyes become yellow. Get help right away if:  Your baby is overly tired (lethargic) and does not want to wake up and feed.  Your baby develops an unexplained fever. Summary  Breastfeeding offers many health benefits for infant and mothers.  Try to breastfeed your infant when he or she shows early signs of hunger.  Gently tickle or stroke your baby's lips with your finger or nipple to allow the baby to open his or her mouth. Bring the baby to your breast. Make sure that much of the areola is in your baby's mouth. Offer one side and burp the baby before you offer the other side.  Talk with your health care provider or lactation consultant if you have questions or you face problems as you breastfeed. This information is not intended to replace advice given to you by your health care provider. Make sure you discuss any questions you have with your health care provider. Document Revised: 07/13/2017 Document Reviewed:  05/20/2016 Elsevier Patient Education  2021 [employer] Inc[employer]   Postpartum Care After Vaginal Delivery The following information offers guidance about how to care for yourself from the time you deliver your baby to 6-12 weeks after delivery (postpartum period). If you have  problems or questions, contact your health care provider for more specific instructions. Follow these instructions at home: Vaginal bleeding  It is normal to have vaginal bleeding (lochia) after delivery. Wear a sanitary pad for bleeding and discharge. ? During the first week after delivery, the amount and appearance of lochia is often similar to a menstrual period. ? Over the next few weeks, it will gradually decrease to a dry, yellow-brown discharge. ? For most women, lochia stops completely by 4-6 weeks after delivery, but can vary.  Change your sanitary pads frequently. Watch for any changes in your flow, such as: ? A sudden increase in volume. ? A change in color. ? Large blood clots.  If you pass a blood clot from your vagina, save it and call your health care provider. Do not flush blood clots down the toilet before talking with your health care provider.  Do not use tampons or douches until your health care provider approves.  If you are not breastfeeding, your period should return 6-8 weeks after delivery. If you are feeding your baby breast milk only, your period may not return until you stop breastfeeding. Perineal care  Keep the area between the vagina and the anus (perineum) clean and dry. Use medicated pads and pain-relieving sprays and creams as directed.  If you had a surgical cut in the perineum (episiotomy) or a tear, check the area for signs of infection until you are healed. Check for: ? More redness, swelling, or pain. ? Fluid or blood coming from the cut or tear. ? Warmth. ? Pus or a bad smell.  You may be given a squirt bottle to use instead of wiping to clean the perineum area after you use  the bathroom. Pat the area gently to dry it.  To relieve pain caused by an episiotomy, a tear, or swollen veins in the anus (hemorrhoids), take a warm sitz bath 2-3 times a day. In a sitz bath, the warm water should only come up to your hips and cover your buttocks.   Breast care  In the first few days after delivery, your breasts may feel heavy, full, and uncomfortable (breast engorgement). Milk may also leak from your breasts. Ask your health care provider about ways to help relieve the discomfort.  If you are breastfeeding: ? Wear a bra that supports your breasts and fits well. Use breast pads to absorb milk that leaks. ? Keep your nipples clean and dry. Apply creams and ointments as told. ? You may have uterine contractions every time you breastfeed for up to several weeks after delivery. This helps your uterus return to its normal size. ? If you have any problems with breastfeeding, notify your health care provider or lactation consultant.  If you are not breastfeeding: ? Avoid touching your breasts. Do not squeeze out (express) milk. Doing this can make your breasts produce more milk. ? Wear a good-fitting bra and use cold packs to help with swelling. Intimacy and sexuality  Ask your health care provider when you can engage in sexual activity. This may depend upon: ? Your risk of infection. ? How fast you are healing. ? Your comfort and desire to engage in sexual activity.  You are able to get pregnant after delivery, even if you have not had your period. Talk with your health care provider about methods of birth control (contraception) or family planning if you desire future pregnancies. Medicines  Take over-the-counter and prescription medicines only as told by your health care provider.  Take an over-the-counter stool softener to help ease bowel movements as told by your health care provider.  If you were prescribed an antibiotic medicine, take it as told by your health care  provider. Do not stop taking the antibiotic even if you start to feel better.  Review all previous and current prescriptions to check for possible transfer into breast milk. Activity  Gradually return to your normal activities as told by your health care provider.  Rest as much as possible. Nap while your baby is sleeping. Eating and drinking  Drink enough fluid to keep your urine pale yellow.  To help prevent or relieve constipation, eat high-fiber foods every day.  Choose healthy eating to support breastfeeding or weight loss goals.  Take your prenatal vitamins until your health care provider tells you to stop.   General tips/recommendations  Do not use any products that contain nicotine or tobacco. These products include cigarettes, chewing tobacco, and vaping devices, such as e-cigarettes. If you need help quitting, ask your health care provider.  Do not drink alcohol, especially if you are breastfeeding.  Do not take medications or drugs that are not prescribed to you, especially if you are breastfeeding.  Visit your health care provider for a postpartum checkup within the first 3-6 weeks after delivery.  Complete a comprehensive postpartum visit no later than 12 weeks after delivery.  Keep all follow-up visits for you and your baby. Contact a health care provider if:  You feel unusually sad or worried.  Your breasts become red, painful, or hard.  You have a fever or other signs of an infection.  You have bleeding that is soaking through one pad an hour or you have blood clots.  You have a severe headache that doesn't go away or you have vision changes.  You have nausea and vomiting and are unable to eat or drink anything for 24 hours. Get help right away if:  You have chest pain or difficulty breathing.  You have sudden, severe leg pain.  You faint or have a seizure.  You have thoughts about hurting yourself or your baby. If you ever feel like you may hurt  yourself or others, or have thoughts about taking your own life, get help right away. Go to your nearest emergency department or:  Call your local emergency services (911 in the [place]  The National Suicide Prevention Lifeline at 408-736-2612. This suicide crisis helpline is open 24 hours a day.  Text the Crisis Text Line at 478-435-0854 (in the [place].). Summary  The period of time after you deliver your newborn up to 6-12 weeks after delivery is called the postpartum period.  Keep all follow-up visits for you and your baby.  Review all previous and current prescriptions to check for possible transfer into breast milk.  Contact a health care provider if you feel unusually sad or worried during the postpartum period. This information is not intended to replace advice given to you by your health care provider. Make sure you discuss any questions you have with your health care provider. Document Revised: 01/02/2020 Document Reviewed: 01/02/2020 Elsevier Patient Education  2021 [employer].

## 2020-07-27 ENCOUNTER — Encounter: Payer: Medicaid Other | Admitting: Certified Nurse Midwife

## 2020-07-27 ENCOUNTER — Ambulatory Visit: Admit: 2020-07-27 | Payer: Medicaid Other | Admitting: Obstetrics and Gynecology

## 2020-07-27 DIAGNOSIS — Z3403 Encounter for supervision of normal first pregnancy, third trimester: Secondary | ICD-10-CM

## 2020-07-27 DIAGNOSIS — Z3A39 39 weeks gestation of pregnancy: Secondary | ICD-10-CM

## 2020-07-27 SURGERY — LIGATION, FALLOPIAN TUBE, POSTPARTUM
Anesthesia: Choice | Laterality: Bilateral

## 2020-07-28 ENCOUNTER — Other Ambulatory Visit: Payer: Self-pay

## 2020-07-28 ENCOUNTER — Encounter: Payer: Self-pay | Admitting: Certified Nurse Midwife

## 2020-07-28 ENCOUNTER — Telehealth: Payer: Self-pay

## 2020-07-28 ENCOUNTER — Ambulatory Visit (INDEPENDENT_AMBULATORY_CARE_PROVIDER_SITE_OTHER): Payer: Medicaid Other | Admitting: Certified Nurse Midwife

## 2020-07-28 VITALS — BP 133/88 | HR 79 | Temp 98.6°F | Resp 16 | Ht 66.0 in | Wt 187.3 lb

## 2020-07-28 DIAGNOSIS — R609 Edema, unspecified: Secondary | ICD-10-CM

## 2020-07-28 LAB — SURGICAL PATHOLOGY

## 2020-07-28 NOTE — Progress Notes (Signed)
HPI:Pt is 30 yr old status post SVD ppBTL 07/24/20 with complaints of swelling in her feet.   Subjective: pt state she has more swelling today and is having some tingling in her feet. She denies epigastric pain, headaches, and visual changes  Objective: BP: 133/88 P79 O2 99%  She has 2 + swelling bilaterally to the knee , notes that she has been standing a little more today. Reflexes are 2+ right leg, 1+ left leg with negative clonus bilaterally. She has slight tenderness in the epigastric region with palpation but state she is not sure if that is related to the BTL. Lungs are clear bilaterally. Pt states she is voiding regular and without difficulty.   Assessment: Pre eclampsia unlikely. Labs collected , probable swelling due to pregnancy and iv fluids in Labor. Pt encouraged to not stand for long periods of time, to elevated her feet, and to use compression socks . Encouraged PO hydration .   Plan: As described above, will follow up with lab results. Red flag symptoms reviewed with pt. She verbalizes and agrees.   Doreene Burke, CNM

## 2020-07-28 NOTE — Telephone Encounter (Signed)
Pt c/o of swelling - feet, ankles, calves- getting worse each day.   S/p svd 3/25 with pp BTL.   NO h/a + abd pain - sore from tubal Neg vision changes + dizziness  No issues urinating.  + eating and drinking  AT aware- Advise pt to come in now. CB aware.

## 2020-07-29 LAB — CBC WITH DIFFERENTIAL/PLATELET
Basophils Absolute: 0 10*3/uL (ref 0.0–0.2)
Basos: 0 %
EOS (ABSOLUTE): 0.2 10*3/uL (ref 0.0–0.4)
Eos: 3 %
Hematocrit: 30.9 % — ABNORMAL LOW (ref 34.0–46.6)
Hemoglobin: 10.4 g/dL — ABNORMAL LOW (ref 11.1–15.9)
Immature Grans (Abs): 0.1 10*3/uL (ref 0.0–0.1)
Immature Granulocytes: 1 %
Lymphocytes Absolute: 2.5 10*3/uL (ref 0.7–3.1)
Lymphs: 28 %
MCH: 29.1 pg (ref 26.6–33.0)
MCHC: 33.7 g/dL (ref 31.5–35.7)
MCV: 87 fL (ref 79–97)
Monocytes Absolute: 0.7 10*3/uL (ref 0.1–0.9)
Monocytes: 7 %
Neutrophils Absolute: 5.6 10*3/uL (ref 1.4–7.0)
Neutrophils: 61 %
Platelets: 478 10*3/uL — ABNORMAL HIGH (ref 150–450)
RBC: 3.57 x10E6/uL — ABNORMAL LOW (ref 3.77–5.28)
RDW: 13.2 % (ref 11.7–15.4)
WBC: 9.1 10*3/uL (ref 3.4–10.8)

## 2020-07-29 LAB — COMPREHENSIVE METABOLIC PANEL
ALT: 30 IU/L (ref 0–32)
AST: 25 IU/L (ref 0–40)
Albumin/Globulin Ratio: 1.6 (ref 1.2–2.2)
Albumin: 3.4 g/dL — ABNORMAL LOW (ref 3.8–4.8)
Alkaline Phosphatase: 168 IU/L — ABNORMAL HIGH (ref 44–121)
BUN/Creatinine Ratio: 14 (ref 9–23)
BUN: 8 mg/dL (ref 6–20)
Bilirubin Total: 0.3 mg/dL (ref 0.0–1.2)
CO2: 21 mmol/L (ref 20–29)
Calcium: 8.8 mg/dL (ref 8.7–10.2)
Chloride: 104 mmol/L (ref 96–106)
Creatinine, Ser: 0.57 mg/dL (ref 0.57–1.00)
Globulin, Total: 2.1 g/dL (ref 1.5–4.5)
Glucose: 73 mg/dL (ref 65–99)
Potassium: 4.6 mmol/L (ref 3.5–5.2)
Sodium: 139 mmol/L (ref 134–144)
Total Protein: 5.5 g/dL — ABNORMAL LOW (ref 6.0–8.5)
eGFR: 124 mL/min/{1.73_m2} (ref >59)

## 2020-07-29 LAB — PROTEIN / CREATININE RATIO, URINE
Creatinine, Urine: 129.3 mg/dL
Protein, Ur: 25.1 mg/dL
Protein/Creat Ratio: 194 mg/g creat (ref 0–200)

## 2020-07-31 ENCOUNTER — Emergency Department: Payer: Medicaid Other

## 2020-07-31 ENCOUNTER — Emergency Department
Admission: EM | Admit: 2020-07-31 | Discharge: 2020-07-31 | Disposition: A | Discharge status: Home / Self Care | Payer: Medicaid Other | Attending: Emergency Medicine | Admitting: Emergency Medicine

## 2020-07-31 ENCOUNTER — Other Ambulatory Visit: Payer: Self-pay

## 2020-07-31 DIAGNOSIS — R079 Chest pain, unspecified: Secondary | ICD-10-CM

## 2020-07-31 DIAGNOSIS — F17210 Nicotine dependence, cigarettes, uncomplicated: Secondary | ICD-10-CM | POA: Insufficient documentation

## 2020-07-31 DIAGNOSIS — L0231 Cutaneous abscess of buttock: Secondary | ICD-10-CM | POA: Insufficient documentation

## 2020-07-31 DIAGNOSIS — R0789 Other chest pain: Secondary | ICD-10-CM | POA: Diagnosis not present

## 2020-07-31 DIAGNOSIS — G8918 Other acute postprocedural pain: Secondary | ICD-10-CM | POA: Insufficient documentation

## 2020-07-31 DIAGNOSIS — Z8616 Personal history of COVID-19: Secondary | ICD-10-CM | POA: Insufficient documentation

## 2020-07-31 LAB — CBC
HCT: 32.5 % — ABNORMAL LOW (ref 36.0–46.0)
Hemoglobin: 10.6 g/dL — ABNORMAL LOW (ref 12.0–15.0)
MCH: 29.4 pg (ref 26.0–34.0)
MCHC: 32.6 g/dL (ref 30.0–36.0)
MCV: 90 fL (ref 80.0–100.0)
Platelets: 450 10*3/uL — ABNORMAL HIGH (ref 150–400)
RBC: 3.61 MIL/uL — ABNORMAL LOW (ref 3.87–5.11)
RDW: 14 % (ref 11.5–15.5)
WBC: 11.3 10*3/uL — ABNORMAL HIGH (ref 4.0–10.5)
nRBC: 0 % (ref 0.0–0.2)

## 2020-07-31 LAB — BASIC METABOLIC PANEL
Anion gap: 8 (ref 5–15)
BUN: 13 mg/dL (ref 6–20)
CO2: 19 mmol/L — ABNORMAL LOW (ref 22–32)
Calcium: 9 mg/dL (ref 8.9–10.3)
Chloride: 107 mmol/L (ref 98–111)
Creatinine, Ser: 0.61 mg/dL (ref 0.44–1.00)
GFR, Estimated: >60 mL/min (ref >60)
Glucose, Bld: 113 mg/dL — ABNORMAL HIGH (ref 70–99)
Potassium: 4 mmol/L (ref 3.5–5.1)
Sodium: 134 mmol/L — ABNORMAL LOW (ref 135–145)

## 2020-07-31 LAB — HEPATIC FUNCTION PANEL
ALT: 20 U/L (ref 0–44)
AST: 17 U/L (ref 15–41)
Albumin: 2.9 g/dL — ABNORMAL LOW (ref 3.5–5.0)
Alkaline Phosphatase: 121 U/L (ref 38–126)
Bilirubin, Direct: <0.1 mg/dL (ref 0.0–0.2)
Total Bilirubin: 0.5 mg/dL (ref 0.3–1.2)
Total Protein: 6.1 g/dL — ABNORMAL LOW (ref 6.5–8.1)

## 2020-07-31 LAB — TROPONIN I (HIGH SENSITIVITY)
Troponin I (High Sensitivity): 2 ng/L (ref <18)
Troponin I (High Sensitivity): 2 ng/L (ref <18)

## 2020-07-31 LAB — LIPASE, BLOOD: Lipase: 24 U/L (ref 11–51)

## 2020-07-31 MED ORDER — ONDANSETRON HCL 4 MG/2ML IJ SOLN
4.0000 mg | Freq: Once | INTRAMUSCULAR | Status: AC
  Administered 2020-07-31: 4 mg via INTRAVENOUS
  Filled 2020-07-31: qty 2

## 2020-07-31 MED ORDER — CEPHALEXIN 500 MG PO CAPS
500.0000 mg | ORAL_CAPSULE | Freq: Once | ORAL | Status: AC
  Administered 2020-07-31: 500 mg via ORAL
  Filled 2020-07-31: qty 1

## 2020-07-31 MED ORDER — SODIUM CHLORIDE 0.9 % IV BOLUS
1000.0000 mL | Freq: Once | INTRAVENOUS | Status: AC
  Administered 2020-07-31: 1000 mL via INTRAVENOUS

## 2020-07-31 MED ORDER — CEPHALEXIN 500 MG PO CAPS: 500.0000 mg | ORAL_CAPSULE | Freq: Three times a day (TID) | ORAL | 0 refills | Status: DC

## 2020-07-31 MED ORDER — IOHEXOL 350 MG/ML SOLN
75.0000 mL | Freq: Once | INTRAVENOUS | Status: AC | PRN
  Administered 2020-07-31: 75 mL via INTRAVENOUS

## 2020-07-31 MED ORDER — LIDOCAINE HCL (PF) 1 % IJ SOLN
5.0000 mL | Freq: Once | INTRAMUSCULAR | Status: AC
  Administered 2020-07-31: 5 mL
  Filled 2020-07-31: qty 5

## 2020-07-31 MED ORDER — FENTANYL CITRATE (PF) 100 MCG/2ML IJ SOLN
50.0000 ug | Freq: Once | INTRAMUSCULAR | Status: AC
  Administered 2020-07-31: 50 ug via INTRAVENOUS
  Filled 2020-07-31: qty 2

## 2020-07-31 NOTE — ED Provider Notes (Signed)
[facility] Emergency Department Provider Note   ____________________________________________   Event Date/Time   First MD Initiated Contact with Patient 07/31/20 0255     (approximate)  I have reviewed the triage vital signs and the nursing notes.   HISTORY  Chief Complaint Abscess and Chest Pain    HPI Veronica Harmon is a 30 y.o. female who presents to the ED from home with a chief complaint of right buttock abscess and chest pain.  Patient is 1 week postpartum NSVD without vaginal tearing, breast-feeding.  Reports painful abscess to right buttock.  Also complains of pain underneath her rib cage particularly on the right.  Her OB did some lab and urine testing 2 days ago to check for preeclampsia; patient reports lab work was normal.  No prior history of preeclampsia and patient was not started on antihypertensive.  Denies fever, chills, cough, shortness of breath, abdominal pain, nausea, vomiting or dysuria.  Denies blurry vision or headaches.      Past Medical History:  Diagnosis Date  . Anxiety   . Chlamydia infection   . Chronic neck pain   . COVID-19 04/2019  . Depression     Patient Active Problem List   Diagnosis Date Noted  . PROM with onset of labor within 24 hours of rupture 07/26/2020  . Vaginal delivery   . [redacted] weeks gestation of pregnancy   . Labor and delivery, indication for care 07/23/2020  . 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 Close exposure to COVID-19 virus 05/17/2020  . Anemia affecting pregnancy 04/30/2020  . Smoker 01/12/2020  . Type O blood, Rh positive 01/10/2020  . Family history of uterine fibroid 01/22/2019  . History of ovarian cyst 01/22/2019  . Cervical herniated disc 06/28/2018  . Anxiety 12/10/2015    Past Surgical History:  Procedure Laterality Date  . CERVICAL DISC ARTHROPLASTY N/A 06/28/2018   Procedure: Cervical five-six Artificial disc replacement;  Surgeon: Tressie Stalker, MD;  Location: [facility] OR;  Service:  Neurosurgery;  Laterality: N/A;  Cervical five-six Artificial disc replacement  . TUBAL LIGATION Bilateral 07/25/2020   Procedure: POST PARTUM TUBAL LIGATION;  Surgeon: Hildred Laser, MD;  Location: [facility] ORS;  Service: Gynecology;  Laterality: Bilateral;  . WISDOM TOOTH EXTRACTION      Prior to Admission medications   Medication Sig Start Date End Date Taking? Authorizing Provider  cephALEXin (KEFLEX) 500 MG capsule Take 1 capsule (500 mg total) by mouth 3 (three) times daily. 07/31/20  Yes Irean Hong, MD  acetaminophen (TYLENOL) 500 MG tablet Take 500-1,000 mg by mouth every 6 (six) hours as needed for mild pain or fever.    [provider]  ibuprofen (ADVIL) 600 MG tablet Take 1 tablet (600 mg total) by mouth every 6 (six) hours. 07/26/20   Lawhorn, Vanessa Morehouse, CNM  Iron, Ferrous Sulfate, 325 (65 Fe) MG TABS Take 1 tablet by mouth daily. 05/01/20   Lawhorn, Vanessa Wildwood, CNM  oxyCODONE-acetaminophen (PERCOCET/ROXICET) 5-325 MG tablet Take 1-2 tablets by mouth every 6 (six) hours as needed for moderate pain or severe pain. 07/26/20   Gunnar Bulla, CNM  Prenatal Vit-Fe Fumarate-FA (MULTIVITAMIN-PRENATAL) 27-0.8 MG TABS tablet Take 1 tablet by mouth daily.     [provider]  triamcinolone ointment (KENALOG) 0.5 % Apply 1 application topically 2 (two) times daily. 06/05/20   Lawhorn, Vanessa , CNM  valACYclovir (VALTREX) 500 MG tablet Take 1 tablet (500 mg total) by mouth 2 (two) times daily. Start at 32  weeks of pregnancy 06/05/20   Gunnar Bulla, CNM    Allergies Allyl isothiocyanate, Sulfa antibiotics, and Vicodin [hydrocodone-acetaminophen]  Family History  Problem Relation Age of Onset  . Cancer Paternal Aunt        breast  . Healthy Mother   . Healthy Father   . Ovarian cancer Neg Hx   . Colon cancer Neg Hx     Social History Social History   Tobacco Use  . Smoking status: Current Every Day Smoker    Packs/day: 0.50     Types: Cigarettes  . Smokeless tobacco: Never Used  Vaping Use  . Vaping Use: Former  Substance Use Topics  . Alcohol use: Not Currently  . Drug use: No    Review of Systems  Constitutional: No fever/chills Eyes: No visual changes. ENT: No sore throat. Cardiovascular: Positive for chest pain. Respiratory: Denies shortness of breath. Gastrointestinal: No abdominal pain.  No nausea, no vomiting.  No diarrhea.  No constipation. Genitourinary: Negative for dysuria. Musculoskeletal: Negative for back pain. Skin: Positive for right buttock abscess. Negative for rash. Neurological: Negative for headaches, focal weakness or numbness.   ____________________________________________   PHYSICAL EXAM:  VITAL SIGNS: ED Triage Vitals  Enc Vitals Group     BP 07/31/20 0214 (!) 151/96     Pulse Rate 07/31/20 0214 82     Resp 07/31/20 0214 18     Temp 07/31/20 0214 97.9 F (36.6 C)     Temp Source 07/31/20 0214 Oral     SpO2 07/31/20 0214 98 %     Weight 07/31/20 0215 185 lb (83.9 kg)     Height 07/31/20 0215 5\' 6"  (1.676 m)     Head Circumference --      Peak Flow --      Pain Score 07/31/20 0215 7     Pain Loc --      Pain Edu? --      Excl. in GC? --     Constitutional: Alert and oriented. Well appearing and in mild acute distress. Eyes: Conjunctivae are normal. PERRL. EOMI. Head: Atraumatic. Nose: No congestion/rhinnorhea. Mouth/Throat: Mucous membranes are moist.   Neck: No stridor.   Cardiovascular: Normal rate, regular rhythm. Grossly normal heart sounds.  Good peripheral circulation. Respiratory: Normal respiratory effort.  No retractions. Lungs CTAB. Gastrointestinal: Soft and mildly tender to epigastrium without rebound or tenderness. No distention. No abdominal bruits. No CVA tenderness. Musculoskeletal: No lower extremity tenderness nor edema.  No joint effusions. Neurologic:  Normal speech and language. No gross focal neurologic deficits are appreciated. No gait  instability. Skin: Right inner buttock along anal crease reveals small, less than 0.75cm in diameter fluctuant abscess with central darkening and surrounding induration.  Skin is warm, dry and intact. No rash noted. Psychiatric: Mood and affect are normal. Speech and behavior are normal.  ____________________________________________   LABS (all labs ordered are listed, but only abnormal results are displayed)  Labs Reviewed  BASIC METABOLIC PANEL - Abnormal; Notable for the following components:      Result Value   Sodium 134 (*)    CO2 19 (*)    Glucose, Bld 113 (*)    All other components within normal limits  CBC - Abnormal; Notable for the following components:   WBC 11.3 (*)    RBC 3.61 (*)    Hemoglobin 10.6 (*)    HCT 32.5 (*)    Platelets 450 (*)    All other components within normal limits  HEPATIC FUNCTION PANEL - Abnormal; Notable for the following components:   Total Protein 6.1 (*)    Albumin 2.9 (*)    All other components within normal limits  LIPASE, BLOOD  POC URINE PREG, ED  TROPONIN I (HIGH SENSITIVITY)  TROPONIN I (HIGH SENSITIVITY)   ____________________________________________  EKG  ED ECG REPORT I, Demorio Seeley J, the attending physician, personally viewed and interpreted this ECG.   Date: 07/31/2020  EKG Time: 0218  Rate: 73  Rhythm: normal EKG, normal sinus rhythm  Axis: Normal  Intervals:none  ST&T Change: Nonspecific  ____________________________________________  RADIOLOGY I, Makyle Eslick J, personally viewed and evaluated these images (plain radiographs) as part of my medical decision making, as well as reviewing the written report by the radiologist.  ED MD interpretation: No acute cardiopulmonary process  Official radiology report(s): DG Chest 2 View  Result Date: 07/31/2020 CLINICAL DATA:  Chest pain, recently postpartum EXAM: CHEST - 2 VIEW COMPARISON:  10/26/2017 FINDINGS: The heart size and mediastinal contours are within normal  limits. Both lungs are clear. The visualized skeletal structures are unremarkable. IMPRESSION: No active cardiopulmonary disease. Electronically Signed   By: Alcide CleverMark  Lukens M.D.   On: 07/31/2020 02:49   CT Angio Chest PE W/Cm &/Or Wo Cm  Result Date: 07/31/2020 CLINICAL DATA:  Postpartum right chest pain EXAM: CT ANGIOGRAPHY CHEST WITH CONTRAST TECHNIQUE: Multidetector CT imaging of the chest was performed using the standard protocol during bolus administration of intravenous contrast. Multiplanar CT image reconstructions and MIPs were obtained to evaluate the vascular anatomy. CONTRAST:  75mL OMNIPAQUE IOHEXOL 350 MG/ML SOLN COMPARISON:  None. FINDINGS: Cardiovascular: There is adequate opacification of the pulmonary arterial tree. No intraluminal filling defect identified to suggest acute pulmonary embolism. The central pulmonary arteries are of normal caliber. Cardiac size within normal limits. No significant coronary artery calcification. No pericardial effusion. The thoracic aorta is unremarkable. Mediastinum/Nodes: No pathologic thoracic adenopathy. Thyroid unremarkable. Residual thymic tissue within the anterior mediastinum. Esophagus unremarkable. Lungs/Pleura: Lungs are clear. No pleural effusion or pneumothorax. Upper Abdomen: No acute abnormality. Musculoskeletal: No chest wall abnormality. No acute or significant osseous findings. Review of the MIP images confirms the above findings. IMPRESSION: No pulmonary embolism.  No acute intrathoracic pathology identified. Electronically Signed   By: Helyn NumbersAshesh  Parikh MD   On: 07/31/2020 04:37    ____________________________________________   PROCEDURES  Procedure(s) performed (including Critical Care):  .1-3 Lead EKG Interpretation Performed by: Irean HongSung, Cain Fitzhenry J, MD Authorized by: Irean HongSung, Mckenna Boruff J, MD     Interpretation: normal     ECG rate:  73   ECG rate assessment: normal     Rhythm: sinus rhythm     Ectopy: none     Conduction: normal   Comments:      Patient placed on cardiac monitor to evaluate for arrhythmias  .Marland Kitchen.Incision and Drainage  Date/Time: 07/31/2020 4:05 AM Performed by: Irean HongSung, Severn Goddard J, MD Authorized by: Irean HongSung, Caydin Yeatts J, MD   Consent:    Consent obtained:  Verbal   Consent given by:  Patient   Risks discussed:  Bleeding, infection, incomplete drainage and pain   Alternatives discussed:  Alternative treatment, delayed treatment and observation Location:    Type:  Abscess   Size:  0.75cm   Location:  Anogenital   Anogenital location: Right buttock. Pre-procedure details:    Skin preparation:  Povidone-iodine Anesthesia:    Anesthesia method:  Local infiltration   Local anesthetic:  Lidocaine 1% w/o epi Procedure type:    Complexity:  Simple  Procedure details:    Incision types:  Stab incision   Incision depth:  Dermal   Wound management:  Probed and deloculated   Drainage:  Purulent   Drainage amount:  Moderate   Packing materials:  1/4 in gauze Post-procedure details:    Procedure completion:  Tolerated well, no immediate complications     ____________________________________________   INITIAL IMPRESSION / ASSESSMENT AND PLAN / ED COURSE  As part of my medical decision making, I reviewed the following data within the electronic MEDICAL RECORD NUMBER Nursing notes reviewed and incorporated, Labs reviewed, EKG interpreted, Old chart reviewed (07/28/2020 office visit and telephone communication, lab work and urine results), Radiograph reviewed and Notes from prior ED visits     30 year old female 1 week postpartum presenting with small right buttock abscess and chest/epigastric pain. Differential diagnosis includes, but is not limited to, ACS, aortic dissection, pulmonary embolism, cardiac tamponade, pneumothorax, pneumonia, pericarditis, myocarditis, GI-related causes including esophagitis/gastritis, and musculoskeletal chest wall pain.    Initial lab work including troponin unremarkable.  Will check LFTs/lipase.   Obtain CTA chest to evaluate for PE.  Monitor blood pressure.  Will attempt to I&D small buttock abscess for symptomatic relief of symptoms.  Clinical Course as of 07/31/20 0516  Fri Jul 31, 2020  [id number] Patient could not tolerate lidocaine injection for abscess I&D.  Had to stop for a minute and administer IV Fentanyl for pain control before resuming numbing and I&D. [JS]  [id number] Updated patient of all laboratory and imaging results.  Blood pressure normalized without intervention.  Patient to have wound check in 2 days and to follow-up closely with her OB.  Strict return precautions given.  Patient verbalizes understanding agrees with plan of care. [JS]    Clinical Course User Index [JS] Irean Hong, MD     ____________________________________________   FINAL CLINICAL IMPRESSION(S) / ED DIAGNOSES  Final diagnoses:  Abscess of buttock, right  Chest pain, unspecified type     ED Discharge Orders         Ordered    cephALEXin (KEFLEX) 500 MG capsule  3 times daily        07/31/20 0502          *Please note:  Veronica Harmon was evaluated in Emergency Department on 07/31/2020 for the symptoms described in the history of present illness. She was evaluated in the context of the global COVID-19 pandemic, which necessitated consideration that the patient might be at risk for infection with the SARS-CoV-2 virus that causes COVID-19. Institutional protocols and algorithms that pertain to the evaluation of patients at risk for COVID-19 are in a state of rapid change based on information released by regulatory bodies including the CDC and federal and state organizations. These policies and algorithms were followed during the patient's care in the ED.  Some ED evaluations and interventions may be delayed as a result of limited staffing during and the pandemic.*   Note:  This document was prepared using Dragon voice recognition software and may include unintentional dictation errors.   Irean Hong, MD 07/31/20 [phone]

## 2020-07-31 NOTE — ED Triage Notes (Addendum)
Pt states back in November she had an abscess on her right side of her butt drained and has since come back and has been causing pt pain. Pt denies any drainage coming from abscess. Pt is 1 week post partum from normal vaginal delivery. Pt states she is also having right sided chest pain

## 2020-07-31 NOTE — Discharge Instructions (Addendum)
1.  Take antibiotic as prescribed (Keflex 500mg  3 times daily x7 days). 2.  Wound check in 2 days, either with your doctor or you are welcome to return to the ER. 3.  Return to the ER for worsening symptoms, persistent vomiting, difficulty breathing or other concerns

## 2020-08-07 ENCOUNTER — Ambulatory Visit (INDEPENDENT_AMBULATORY_CARE_PROVIDER_SITE_OTHER): Payer: Medicaid Other | Admitting: Certified Nurse Midwife

## 2020-08-07 ENCOUNTER — Other Ambulatory Visit: Payer: Self-pay

## 2020-08-07 ENCOUNTER — Encounter: Payer: Self-pay | Admitting: Certified Nurse Midwife

## 2020-08-07 DIAGNOSIS — Z1331 Encounter for screening for depression: Secondary | ICD-10-CM

## 2020-08-07 NOTE — Progress Notes (Signed)
Virtual Visit via Video Note  I connected with Veronica Harmon on 08/07/20 at 11:30 AM EDT by a video enabled telemedicine application and verified that I am speaking with the correct person using two identifiers.  Location:  Patient: Veronica Harmon (name)  Provider: Serafina Royals, CNM ([facility], [facility])   I discussed the limitations of evaluation and management by telemedicine and the availability of in person appointments. The patient expressed understanding and agreed to proceed.  History of Present Illness:  Patient is two (2) week status postpartum after spontaneous vaginal birth and bilateral tubal ligation for permanent sterilization.   Notes some "baby blues", getting better daily.   Breastfeeding and supplementing with bottle, going well.   Bleeding is light to moderate.   Denies difficulty breathing or respiratory distress, chest pain, abdominal pain, excessive vaginal bleeding, and dysuria.   Seen in ER on 07/31/2020 for incision and drainage of abscess.    Observations/Objective:  Depression screen [facility] 2/9 08/07/2020 07/28/2020 07/02/2020  Decreased Interest 1 0 0  Down, Depressed, Hopeless 1 0 0  PHQ - 2 Score 2 0 0  Altered sleeping 0 - -  Tired, decreased energy 1 - -  Change in appetite 0 - -  Feeling bad or failure about yourself  0 - -  Trouble concentrating 0 - -  Moving slowly or fidgety/restless 0 - -  Suicidal thoughts 0 - -  PHQ-9 Score 3 - -  Difficult doing work/chores Somewhat difficult - -   GAD 7 : Generalized Anxiety Score 08/07/2020 07/12/2018  Nervous, Anxious, on Edge 1 1  Control/stop worrying 1 1  Worry too much - different things 1 1  Trouble relaxing 0 1  Restless 0 0  Easily annoyed or irritable 0 1  Afraid - awful might happen 0 1  Total GAD 7 Score 3 6  Anxiety Difficulty Not difficult at all Somewhat difficult    Assessment:  Postpartum care and examination  Lactating mother  Depression screening  negative  Plan:  Routine postpartum care and education.   Reviewed red flag symptoms and when to call.   RTC for PPV on 09/10/2020 as previously scheduled or sooner if needed.   Follow Up Instructions:    I discussed the assessment and treatment plan with the patient. The patient was provided an opportunity to ask questions and all were answered. The patient agreed with the plan and demonstrated an understanding of the instructions.   The patient was advised to call back or seek an in-person evaluation if the symptoms worsen or if the condition fails to improve as anticipated.  I provided 6 minutes of non-face-to-face time during this encounter.   Serafina Royals, CNM [facility], [facility] 08/07/20 12:03 PM

## 2020-09-04 ENCOUNTER — Other Ambulatory Visit: Payer: Self-pay

## 2020-09-04 ENCOUNTER — Ambulatory Visit (INDEPENDENT_AMBULATORY_CARE_PROVIDER_SITE_OTHER): Payer: Medicaid Other | Admitting: Certified Nurse Midwife

## 2020-09-04 ENCOUNTER — Encounter: Payer: Self-pay | Admitting: Certified Nurse Midwife

## 2020-09-04 ENCOUNTER — Encounter: Payer: Medicaid Other | Admitting: Certified Nurse Midwife

## 2020-09-04 DIAGNOSIS — Z1331 Encounter for screening for depression: Secondary | ICD-10-CM

## 2020-09-04 NOTE — Progress Notes (Addendum)
Subjective:    Veronica Harmon is a 30 y.o. [id number] Caucasian female who presents for a postpartum visit. She is 6 weeks postpartum following a spontaneous vaginal delivery and tubal at [redacted]w[redacted]d gestation. Anesthesia: epidural. I have fully reviewed the prenatal and intrapartum course.   Postpartum course has been uncomplicaated.   Baby's course has been uncomplicated.   Baby is feeding by both breast and bottle - gerber.   Reports milk supply has decreased since introducing formula, has been trying to increase supply.  Bleeding no bleeding.   Bowel function is normal. Bladder function is normal.   Patient is sexually active. Contraception method is tubal ligation.   Postpartum depression screening: negative. Score 2/5.    Last pap 07/12/2018 and was Neg/Neg.  Patient questions if cleared to resume physical exercise at the gym.   Denies difficulty breathing or respiratory distress, chest pain, constipation, and/or leg pain or swelling.  The following portions of the patient's history were reviewed and updated as appropriate: allergies, current medications, past medical history, past surgical history and problem list.  Review of Systems  Pertinent items are noted in HPI.   Objective:   BP 116/83   Pulse 99   Resp 16   Ht 5\' 6"  (1.676 m)   Wt 77.2 kg   LMP 10/19/2019   Breastfeeding Yes   BMI 27.49 kg/m   General:  alert, cooperative and no distress   Breasts:  deferred, no complaints  Lungs: clear to auscultation bilaterally  Heart:  regular rate and rhythm  Abdomen: soft, nontender   Vulva: normal  Vagina: normal vagina  Cervix:  closed  Corpus: Well-involuted  Adnexa:  Non-palpable          Depression screen [facility] 2/9 09/04/2020 08/07/2020 07/28/2020 07/02/2020  Decreased Interest 0 1 0 0  Down, Depressed, Hopeless 0 1 0 0  PHQ - 2 Score 0 2 0 0  Altered sleeping 0 0 - -  Tired, decreased energy 2 1 - -  Change in appetite 0 0 - -  Feeling bad or failure about  yourself  0 0 - -  Trouble concentrating 0 0 - -  Moving slowly or fidgety/restless 0 0 - -  Suicidal thoughts 0 0 - -  PHQ-9 Score 2 3 - -  Difficult doing work/chores Somewhat difficult Somewhat difficult - -   GAD 7 : Generalized Anxiety Score 09/04/2020 08/07/2020 07/12/2018  Nervous, Anxious, on Edge 0 1 1  Control/stop worrying 0 1 1  Worry too much - different things 0 1 1  Trouble relaxing 0 0 1  Restless 0 0 0  Easily annoyed or irritable 3 0 1  Afraid - awful might happen 2 0 1  Total GAD 7 Score 5 3 6   Anxiety Difficulty Somewhat difficult Not difficult at all Somewhat difficult     Assessment:   Postpartum exam  6 wks s/p vaginal delivery  Breastfeeding and formula feeding  Depression screening  Plan:   Contraception: tubal ligation   Discussed ways to increase milk supply, encouraged patient to try power pumping again, incorporate more  and maintain hydration, reassurance provided.  Patient permitted to perform light exercise and increase intensity as her body allows and incorporate core engaging exercises.   Reviewed red flag symptoms and when to call.  RTC x 9 months for ANNUAL EXAM or sooner if needed.  09/11/2018, [employer] 09/04/20 2:19 PM

## 2020-09-04 NOTE — Progress Notes (Signed)
I have seen, interviewed, and examined the patient in conjunction with the [employer] and affirm the diagnosis and management plan.   Gunnar Bulla, CNM [facility], [facility] 09/04/20 5:01 PM

## 2020-09-04 NOTE — Patient Instructions (Signed)
Preventive Care 21-32 Years Old, Female Preventive care refers to lifestyle choices and visits with your health care provider that can promote health and wellness. This includes:  A yearly physical exam. This is also called an annual wellness visit.  Regular dental and eye exams.  Immunizations.  Screening for certain conditions.  Healthy lifestyle choices, such as: ? Eating a healthy diet. ? Getting regular exercise. ? Not using drugs or products that contain nicotine and tobacco. ? Limiting alcohol use. What can I expect for my preventive care visit? Physical exam Your health care provider may check your:  Height and weight. These may be used to calculate your BMI (body mass index). BMI is a measurement that tells if you are at a healthy weight.  Heart rate and blood pressure.  Body temperature.  Skin for abnormal spots. Counseling Your health care provider may ask you questions about your:  Past medical problems.  Family's medical history.  Alcohol, tobacco, and drug use.  Emotional well-being.  Home life and relationship well-being.  Sexual activity.  Diet, exercise, and sleep habits.  Work and work environment.  Access to firearms.  Method of birth control.  Menstrual cycle.  Pregnancy history. What immunizations do I need? Vaccines are usually given at various ages, according to a schedule. Your health care provider will recommend vaccines for you based on your age, medical history, and lifestyle or other factors, such as travel or where you work.   What tests do I need? Blood tests  Lipid and cholesterol levels. These may be checked every 5 years starting at age 20.  Hepatitis C test.  Hepatitis B test. Screening  Diabetes screening. This is done by checking your blood sugar (glucose) after you have not eaten for a while (fasting).  STD (sexually transmitted disease) testing, if you are at risk.  BRCA-related cancer screening. This may be  done if you have a family history of breast, ovarian, tubal, or peritoneal cancers.  Pelvic exam and Pap test. This may be done every 3 years starting at age 21. Starting at age 30, this may be done every 5 years if you have a Pap test in combination with an HPV test. Talk with your health care provider about your test results, treatment options, and if necessary, the need for more tests.   Follow these instructions at home: Eating and drinking  Eat a healthy diet that includes fresh fruits and vegetables, whole grains, lean protein, and low-fat dairy products.  Take vitamin and mineral supplements as recommended by your health care provider.  Do not drink alcohol if: ? Your health care provider tells you not to drink. ? You are pregnant, may be pregnant, or are planning to become pregnant.  If you drink alcohol: ? Limit how much you have to 0-1 drink a day. ? Be aware of how much alcohol is in your drink. In the [place] one drink equals one 12 oz bottle of beer (355 mL), one 5 oz glass of wine (148 mL), or one 1 oz glass of hard liquor (44 mL).   Lifestyle  Take daily care of your teeth and gums. Brush your teeth every morning and night with fluoride toothpaste. Floss one time each day.  Stay active. Exercise for at least 30 minutes 5 or more days each week.  Do not use any products that contain nicotine or tobacco, such as cigarettes, e-cigarettes, and chewing tobacco. If you need help quitting, ask your health care provider.  Do not   use drugs.  If you are sexually active, practice safe sex. Use a condom or other form of protection to prevent STIs (sexually transmitted infections).  If you do not wish to become pregnant, use a form of birth control. If you plan to become pregnant, see your health care provider for a prepregnancy visit.  Find healthy ways to cope with stress, such as: ? Meditation, yoga, or listening to music. ? Journaling. ? Talking to a trusted  person. ? Spending time with friends and family. Safety  Always wear your seat belt while driving or riding in a vehicle.  Do not drive: ? If you have been drinking alcohol. Do not ride with someone who has been drinking. ? When you are tired or distracted. ? While texting.  Wear a helmet and other protective equipment during sports activities.  If you have firearms in your house, make sure you follow all gun safety procedures.  Seek help if you have been physically or sexually abused. What's next?  Go to your health care provider once a year for an annual wellness visit.  Ask your health care provider how often you should have your eyes and teeth checked.  Stay up to date on all vaccines. This information is not intended to replace advice given to you by your health care provider. Make sure you discuss any questions you have with your health care provider. Document Revised: 12/15/2019 Document Reviewed: 12/28/2017 Elsevier Patient Education  2021 Elsevier Inc.  

## 2020-09-10 ENCOUNTER — Encounter: Payer: Medicaid Other | Admitting: Certified Nurse Midwife

## 2020-12-18 ENCOUNTER — Other Ambulatory Visit: Payer: Self-pay | Admitting: Certified Nurse Midwife

## 2020-12-18 MED ORDER — DOXYCYCLINE MONOHYDRATE 100 MG PO TABS: 100.0000 mg | ORAL_TABLET | Freq: Two times a day (BID) | ORAL | 0 refills | Status: AC

## 2020-12-28 ENCOUNTER — Ambulatory Visit (INDEPENDENT_AMBULATORY_CARE_PROVIDER_SITE_OTHER): Payer: Medicaid Other | Admitting: Certified Nurse Midwife

## 2020-12-28 ENCOUNTER — Other Ambulatory Visit: Payer: Self-pay

## 2020-12-28 ENCOUNTER — Encounter: Payer: Self-pay | Admitting: Certified Nurse Midwife

## 2020-12-28 DIAGNOSIS — F3289 Other specified depressive episodes: Secondary | ICD-10-CM | POA: Diagnosis not present

## 2020-12-28 DIAGNOSIS — F419 Anxiety disorder, unspecified: Secondary | ICD-10-CM

## 2020-12-28 MED ORDER — HYDROXYZINE PAMOATE 25 MG PO CAPS: 25.0000 mg | ORAL_CAPSULE | Freq: Three times a day (TID) | ORAL | 0 refills | Status: AC | PRN

## 2020-12-28 MED ORDER — FLUOXETINE HCL 10 MG PO CAPS: 10.0000 mg | ORAL_CAPSULE | Freq: Every day | ORAL | 0 refills | Status: DC

## 2020-12-28 NOTE — Progress Notes (Signed)
Virtual Visit via Telephone Note  I connected with Veronica Harmon on 12/28/20 at 11:30 AM EDT by telephone and verified that I am speaking with the correct person using two identifiers.  Location: Patient: at home Provider: at office    I discussed the limitations, risks, security and privacy concerns of performing an evaluation and management service by telephone and the availability of in person appointments. I also discussed with the patient that there may be a patient responsible charge related to this service. The patient expressed understanding and agreed to proceed.   History of Present Illness: Pt is 5 months post partum and is having anxiety and depression. States she more bad days than good. She does not want to get out of bed, does not want to take care of herself of her children. She is experiencing episode of irritability and rage, getting upset over things that usually do not bother her. She has tried to brush it off and feels like she is at a point that she is overwhelmed. She has taking Prozac in the past for anxiety.    Observations/Objective: PHQ9 SCORE ONLY 12/28/2020 09/04/2020 08/07/2020  PHQ-9 Total Score 18 2 3    GAD 7 : Generalized Anxiety Score 12/28/2020 09/04/2020 08/07/2020 07/12/2018  Nervous, Anxious, on Edge 2 0 1 1  Control/stop worrying 3 0 1 1  Worry too much - different things 3 0 1 1  Trouble relaxing 2 0 0 1  Restless 0 0 0 0  Easily annoyed or irritable 3 3 0 1  Afraid - awful might happen 1 2 0 1  Total GAD 7 Score 14 5 3 6   Anxiety Difficulty Somewhat difficult Somewhat difficult Not difficult at all Somewhat difficult      Assessment and Plan: Discused use of prozac, she is no longer breastfeeding. Will start with 10 mg daily. Stated that she ended up taking 20 mg daily when she was on it previously but would like to try the 10 mg for now. She also request medication for anxiety episode. Discussed vistaril. She verbalizes and agrees.   Follow Up  Instructions: 4 wks for medication check .    I discussed the assessment and treatment plan with the patient. The patient was provided an opportunity to ask questions and all were answered. The patient agreed with the plan and demonstrated an understanding of the instructions.   The patient was advised to call back or seek an in-person evaluation if the symptoms worsen or if the condition fails to improve as anticipated.  I provided 10 minutes of non-face-to-face time during this encounter.   09/11/2018, CNM

## 2021-01-25 ENCOUNTER — Encounter: Payer: Self-pay | Admitting: Certified Nurse Midwife

## 2021-01-25 ENCOUNTER — Other Ambulatory Visit: Payer: Self-pay

## 2021-01-25 ENCOUNTER — Ambulatory Visit (INDEPENDENT_AMBULATORY_CARE_PROVIDER_SITE_OTHER): Payer: Medicaid Other | Admitting: Certified Nurse Midwife

## 2021-01-25 DIAGNOSIS — O99345 Other mental disorders complicating the puerperium: Secondary | ICD-10-CM

## 2021-01-25 DIAGNOSIS — F53 Postpartum depression: Secondary | ICD-10-CM

## 2021-01-25 NOTE — Progress Notes (Signed)
Virtual Visit via Telephone Note  I connected with Veronica Harmon on 01/25/21 at 11:30 AM EDT by telephone and verified that I am speaking with the correct person using two identifiers.  Location: Patient: at home Provider: at the office    I discussed the limitations, risks, security and privacy concerns of performing an evaluation and management service by telephone and the availability of in person appointments. I also discussed with the patient that there may be a patient responsible charge related to this service. The patient expressed understanding and agreed to proceed.   History of Present Illness: Pt seen 12/28/20 for her PPV and was noted to have postpartum depression. She was ordered prozac 10 mg a day    Observations/Objective: Pt state that she was to scarred to take it and has not stated the medication. States she does not like to take medications and does not want to be dependent on it for her mood.   Assessment and Plan: Discussed use of exercise, herbal supplement, rest, good nutrition , and therapist . She is in agreement for referral for therapist. Janene Madeira that should things change and she decides to take the medicatoin she will reach out to me and let me know.   GAD 7 : Generalized Anxiety Score 01/25/2021 12/28/2020 09/04/2020 08/07/2020  Nervous, Anxious, on Edge 2 2 0 1  Control/stop worrying 2 3 0 1  Worry too much - different things 2 3 0 1  Trouble relaxing 1 2 0 0  Restless 0 0 0 0  Easily annoyed or irritable 3 3 3  0  Afraid - awful might happen 1 1 2  0  Total GAD 7 Score 11 14 5 3   Anxiety Difficulty Somewhat difficult Somewhat difficult Somewhat difficult Not difficult at all   Flowsheet Row Office Visit from 01/25/2021 in [facility]  PHQ-9 Total Score 11       Her scores have improved. Will placed order for therapist. Follow up prn .    Follow Up Instructions:    I discussed the assessment and treatment plan with the patient. The patient  was provided an opportunity to ask questions and all were answered. The patient agreed with the plan and demonstrated an understanding of the instructions.   The patient was advised to call back or seek an in-person evaluation if the symptoms worsen or if the condition fails to improve as anticipated.  I provided 6 minutes of non-face-to-face time during this encounter.   , CNM

## 2021-01-25 NOTE — Patient Instructions (Signed)
Postpartum Baby Blues The postpartum period begins right after the birth of a baby. During this time, there is often joy and excitement. It is also a time of many changes in the life of the parents. A mother may feel happy one minute and sad or stressed the next. These feelings of sadness, called the baby blues, usually happen in theperiod right after the baby is born and go away within a week or two. What are the causes? The exact cause of this condition is not known. Changes in hormone levels afterchildbirth are believed to trigger some of the symptoms. Other factors that can play a role in these mood changes include: Lack of sleep. Stressful life events, such as financial problems, caring for a loved one, or death of a loved one. Genetics. What are the signs or symptoms? Symptoms of this condition include: Changes in mood, such as going from extreme happiness to sadness. A decrease in concentration. Difficulty sleeping. Crying spells and tearfulness. Loss of appetite. Irritability. Anxiety. If these symptoms last for more than 2 weeks or become more severe, you mayhave postpartum depression. How is this diagnosed? This condition is diagnosed based on an evaluation of your symptoms. Your health care provider may use a screening tool that includes a list of questionsto help identify a person with the baby blues or postpartum depression. How is this treated? The baby blues usually go away on their own in 1-2 weeks. Social support isoften what is needed. You will be encouraged to get adequate sleep and rest. Follow these instructions at home: Lifestyle     Get as much rest as you can. Take a nap when the baby sleeps. Exercise regularly as told by your health care provider. Some women find yoga and walking to be helpful. Eat a balanced and nourishing diet. This includes plenty of fruits and vegetables, whole grains, and lean proteins. Do little things that you enjoy. Take a bubble bath,  read your favorite magazine, or listen to your favorite music. Avoid alcohol. Ask for help with household chores, cooking, grocery shopping, or running errands. Do not try to do everything yourself. Consider hiring a postpartum doula to help. This is a professional who specializes in providing support to new mothers. Try not to make any major life changes during pregnancy or right after giving birth. This can add stress. General instructions Talk to people close to you about how you are feeling. Get support from your partner, family members, friends, or other new moms. You may want to join a support group. Find ways to manage stress. This may include: Writing your thoughts and feelings in a journal. Spending time outside. Spending time with people who make you laugh. Try to stay positive in how you think. Think about the things you are grateful for. Take over-the-counter and prescription medicines only as told by your health care provider. Let your health care provider know if you have any concerns. Keep all postpartum visits. This is important. Contact a health care provider if: Your baby blues do not go away after 2 weeks. Get help right away if: You have thoughts of taking your own life (suicidal thoughts), or of harming your baby or someone else. You see or hear things that are not there (hallucinations). If you ever feel like you may hurt yourself or others, or have thoughts about taking your own life, get help right away. Go to your nearest emergency department or: Call your local emergency services (911 in the [place] Call a   suicide crisis helpline, such as the National Suicide Prevention Lifeline, at 1-800-273-8255. This is open 24 hours a day in the [place] Text the Crisis Text Line at 741741 (in the [place] Summary After giving birth, you may feel happy one minute and sad or stressed the next. Feelings of sadness that happen right after the baby is born and go away after a week or two  are called the baby blues. You can manage the baby blues by getting enough rest, eating a healthy diet, exercising, spending time with supportive people, and finding ways to manage stress. If feelings of sadness and stress last longer than 2 weeks or get in the way of caring for your baby, talk with your health care provider. This may mean you have postpartum depression. This information is not intended to replace advice given to you by your health care provider. Make sure you discuss any questions you have with your healthcare provider. Document Revised: 10/11/2019 Document Reviewed: 10/11/2019 Elsevier Patient Education  2022 Elsevier Inc.  

## 2021-04-12 ENCOUNTER — Encounter: Payer: Self-pay | Admitting: Certified Nurse Midwife

## 2021-04-21 ENCOUNTER — Other Ambulatory Visit: Payer: Self-pay | Admitting: Certified Nurse Midwife

## 2021-04-21 MED ORDER — FLUOXETINE HCL 20 MG PO CAPS: 20.0000 mg | ORAL_CAPSULE | Freq: Every day | ORAL | 3 refills | Status: AC

## 2021-05-11 ENCOUNTER — Encounter: Payer: Self-pay | Admitting: Certified Nurse Midwife

## 2021-05-11 ENCOUNTER — Other Ambulatory Visit: Payer: Self-pay

## 2021-05-11 DIAGNOSIS — N949 Unspecified condition associated with female genital organs and menstrual cycle: Secondary | ICD-10-CM

## 2021-05-11 MED ORDER — METRONIDAZOLE 0.75 % VA GEL: 1.0000 | Freq: Every day | VAGINAL | 1 refills | Status: AC

## 2021-06-17 ENCOUNTER — Other Ambulatory Visit: Payer: Self-pay | Admitting: Family Medicine

## 2021-06-17 DIAGNOSIS — M5020 Other cervical disc displacement, unspecified cervical region: Secondary | ICD-10-CM

## 2021-06-17 DIAGNOSIS — M5412 Radiculopathy, cervical region: Secondary | ICD-10-CM

## 2021-06-17 DIAGNOSIS — G43809 Other migraine, not intractable, without status migrainosus: Secondary | ICD-10-CM

## 2021-06-22 ENCOUNTER — Encounter: Payer: Medicaid Other | Admitting: Certified Nurse Midwife

## 2021-06-28 ENCOUNTER — Ambulatory Visit: Payer: Medicaid Other

## 2021-08-31 ENCOUNTER — Ambulatory Visit: Payer: Self-pay | Admitting: General Surgery

## 2021-08-31 NOTE — H&P (Signed)
?PATIENT PROFILE: ?Aubrey Blackard is a 31 y.o. female who presents to the Clinic for consultation at the request of Fields, NP for evaluation of recurrent infection of buttock cyst. ? ?PCP:  Areta Haber, NP ? ?HISTORY OF PRESENT ILLNESS: ?Ms. Lyn reports initially had an infection on the right buttock area on November 2021.  She endorses that she need incision and drainage.  She is treated with antibiotic and packing.  After he heals it came back again on April 2022.  Since then he has been having intermittent every month.  The patient endorses that she drained it at home at least 1 time per month.  She denies any fever or chills.  Currently is chronically draining but no cellulitis or pus.  The area is tender palpation.  Pain is aggravated by applying pressure.  There is no alleviating factors.  Pain does not radiate to other part of the body. ? ? ?PROBLEM LIST: ?Problem List  Date Reviewed: 08/26/2021  ? ?       Noted  ? Migraine without aura and without status migrainosus, not intractable 05/21/2021  ? 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 Close exposure to COVID-19 virus 05/17/2020  ? Smoker 01/12/2020  ? Type O blood, Rh positive 01/10/2020  ? Family history of uterine fibroid 01/22/2019  ? History of ovarian cyst 01/22/2019  ? Cervical herniated disc 06/28/2018  ? Anxiety 12/10/2015  ? Hereditary disease (father of the baby with congenital heart disease) in family possibly affecting fetus 08/21/2012  ? Tobacco smoking complicating pregnancy 08/14/2012  ? Other known or suspected fetal abnormality, not elsewhere classified, affecting management of mother, antepartum condition or complication 08/14/2012  ? ? ?GENERAL REVIEW OF SYSTEMS:  ? ?General ROS: negative for - chills, fatigue, fever, weight gain or weight loss ?Allergy and Immunology ROS: negative for - hives  ?Hematological and Lymphatic ROS: negative for - bleeding problems or bruising, negative for palpable nodes ?Endocrine ROS: negative for - heat or cold  intolerance, hair changes ?Respiratory ROS: negative for - cough, shortness of breath or wheezing ?Cardiovascular ROS: no chest pain or palpitations ?GI ROS: negative for nausea, vomiting, abdominal pain, diarrhea, constipation ?Musculoskeletal ROS: negative for - joint swelling or muscle pain ?Neurological ROS: negative for - confusion, syncope ?Dermatological ROS: negative for pruritus and rash ?Psychiatric: negative for anxiety, depression, difficulty sleeping and memory loss ? ?MEDICATIONS: ?Current Outpatient Medications  ?Medication Sig Dispense Refill  ? doxycycline (VIBRAMYCIN) 100 MG capsule Take 1 capsule (100 mg total) by mouth 2 (two) times daily for 7 days 14 capsule 0  ? FLUoxetine (PROZAC) 40 MG capsule Take 1 capsule (40 mg total) by mouth once daily 90 capsule 1  ? buPROPion (WELLBUTRIN XL) 150 MG XL tablet Take 150 mg by mouth once daily (Patient not taking: Reported on 08/31/2021)    ? methocarbamoL (ROBAXIN) 500 MG tablet 1/2-1 po qHS prn (Patient not taking: Reported on 08/31/2021) 30 tablet 3  ? ?No current facility-administered medications for this visit.  ? ? ?ALLERGIES: ?Hydrocodone-acetaminophen, Mustard, Sulfa (sulfonamide antibiotics), and Zoloft [sertraline] ? ?PAST MEDICAL HISTORY: ?Past Medical History:  ?Diagnosis Date  ? Acne   ? Anxiety   ? Depression   ? Headache(784.0)   ? ? ?PAST SURGICAL HISTORY: ?Past Surgical History:  ?Procedure Laterality Date  ? LAPAROSCOPIC TUBAL LIGATION Bilateral   ? REPLACEMENT DISC ANTERIOR LUMBAR SPINE Bilateral   ?  ? ?FAMILY HISTORY: ?Family History  ?Problem Relation Age of Onset  ? Depression Mother   ?  Anxiety Mother   ? Fibrocystic breast disease Mother   ? Alcohol abuse Father   ? Depression Father   ? Anxiety Sister   ? Migraines Sister   ? ADD / ADHD Sister   ? Migraines Sister   ? Cancer Paternal Aunt   ?     breast  ? Diabetes Cousin   ? Birth defects Neg Hx   ? Mental retardation Neg Hx   ? Congenital heart disease Neg Hx   ? Myocardial  Infarction (Heart attack) Neg Hx   ? Stroke Neg Hx   ? Thyroid disease Neg Hx   ? Lupus Neg Hx   ? High blood pressure (Hypertension) Neg Hx   ? Hyperlipidemia (Elevated cholesterol) Neg Hx   ? Irregular Heart Beat (Arrhythmia) Neg Hx   ? Pacemaker Neg Hx   ?  ? ?SOCIAL HISTORY: ?Social History  ? ?Socioeconomic History  ? Marital status: Single  ?Tobacco Use  ? Smoking status: Every Day  ?  Packs/day: 0.50  ?  Types: Cigarettes  ? Smokeless tobacco: Never  ?Vaping Use  ? Vaping Use: Never used  ?Substance and Sexual Activity  ? Alcohol use: Not Currently  ? Drug use: Never  ? Sexual activity: Yes  ? ? ?PHYSICAL EXAM: ?Vitals:  ? 08/31/21 1342  ?BP: 105/73  ?Pulse: 83  ? ?Body mass index is 24.53 kg/m?. ?Weight: 68.9 kg (152 lb) (per chart)  ? ?GENERAL: Alert, active, oriented x3 ? ?HEENT: Pupils equal reactive to light. Extraocular movements are intact. Sclera clear. Palpebral conjunctiva normal red color.Pharynx clear. ? ?NECK: Supple with no palpable mass and no adenopathy. ? ?LUNGS: Sound clear with no rales rhonchi or wheezes. ? ?HEART: Regular rhythm S1 and S2 without murmur. ? ?ABDOMEN: Soft and depressible, nontender with no palpable mass, no hepatomegaly. Wounds dry and clean. ? ?BACK: 2 cm area of the right buttock almost at the midline with chronic sinus most likely chronic cavity of an abscess.  No significant cellulitis or purulence. ? ?EXTREMITIES: Well-developed well-nourished symmetrical with no dependent edema. ? ?NEUROLOGICAL: Awake alert oriented, facial expression symmetrical, moving all extremities. ? ?REVIEW OF DATA: ?I have reviewed the following data today: ?Office Visit on 08/02/2021  ?Component Date Value  ? Xpress Strep A, PCR 08/02/2021 Not Detected   ?Office Visit on 07/22/2021  ?Component Date Value  ? Glucose 07/22/2021 71   ? Sodium 07/22/2021 139   ? Potassium 07/22/2021 4.1   ? Chloride 07/22/2021 106   ? Carbon Dioxide (CO2) 07/22/2021 29.0   ? Calcium 07/22/2021 9.5   ? Urea  Nitrogen (BUN) 07/22/2021 11   ? Creatinine 07/22/2021 0.7   ? Glomerular Filtration Ra* 07/22/2021 96   ? BUN/Crea Ratio 07/22/2021 15.7   ? Anion Gap w/K 07/22/2021 8.1   ? Uric Acid 07/22/2021 4.1   ? Sedimentation Rate-Autom* 07/22/2021 6   ? C Reactive Protein - Lab* 07/22/2021 2   ? ANA Direct - [facility] 07/22/2021 Negative   ? RA Latex Turbid. Bonnee Quin* 07/22/2021 <10.0   ?  ? ?ASSESSMENT: ?Ms. Lhommedieu is a 31 y.o. female presenting for consultation for recurrent infection of epidermal inclusion cyst. The patient has a mass on the right buttock that is causing some pain and discomfort on pressure and recurrent infections. Patient oriented about the diagnosis of epidermal inclusion cyst, not a malignant lesion but if it is causing symptoms, excision can be considered. Patient oriented about the procedure, benefits and risk.  ? ?Cyst  of buttocks [L72.9] ? ?PLAN: ?1.  Excision of right buttock cyst ([id number]) ?2.  Avoid taking aspirin 5 days before the procedure ?3.  Contact us if you have any concern ? ?Patient verbalized understanding, all questions were answered, and were agreeable with the plan outlined above.  ? ?Carolan ShiverEdgardo Cintron-Diaz, MD ? ?Electronically signed by Carolan ShiverEdgardo Cintron-Diaz, MD ?

## 2021-08-31 NOTE — H&P (View-Only) (Signed)
?PATIENT PROFILE: ?Veronica Harmon is a 31 y.o. female who presents to the Clinic for consultation at the request of Fields, NP for evaluation of recurrent infection of buttock cyst. ? ?PCP:  Veronica Haber, NP ? ?HISTORY OF PRESENT ILLNESS: ?Veronica Harmon reports initially had an infection on the right buttock area on November 2021.  She endorses that she need incision and drainage.  She is treated with antibiotic and packing.  After he heals it came back again on April 2022.  Since then he has been having intermittent every month.  The patient endorses that she drained it at home at least 1 time per month.  She denies any fever or chills.  Currently is chronically draining but no cellulitis or pus.  The area is tender palpation.  Pain is aggravated by applying pressure.  There is no alleviating factors.  Pain does not radiate to other part of the body. ? ? ?PROBLEM LIST: ?Problem List  Date Reviewed: 08/26/2021  ? ?       Noted  ? Migraine without aura and without status migrainosus, not intractable 05/21/2021  ? 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 Close exposure to COVID-19 virus 05/17/2020  ? Smoker 01/12/2020  ? Type O blood, Rh positive 01/10/2020  ? Family history of uterine fibroid 01/22/2019  ? History of ovarian cyst 01/22/2019  ? Cervical herniated disc 06/28/2018  ? Anxiety 12/10/2015  ? Hereditary disease (father of the baby with congenital heart disease) in family possibly affecting fetus 08/21/2012  ? Tobacco smoking complicating pregnancy 08/14/2012  ? Other known or suspected fetal abnormality, not elsewhere classified, affecting management of mother, antepartum condition or complication 08/14/2012  ? ? ?GENERAL REVIEW OF SYSTEMS:  ? ?General ROS: negative for - chills, fatigue, fever, weight gain or weight loss ?Allergy and Immunology ROS: negative for - hives  ?Hematological and Lymphatic ROS: negative for - bleeding problems or bruising, negative for palpable nodes ?Endocrine ROS: negative for - heat or cold  intolerance, hair changes ?Respiratory ROS: negative for - cough, shortness of breath or wheezing ?Cardiovascular ROS: no chest pain or palpitations ?GI ROS: negative for nausea, vomiting, abdominal pain, diarrhea, constipation ?Musculoskeletal ROS: negative for - joint swelling or muscle pain ?Neurological ROS: negative for - confusion, syncope ?Dermatological ROS: negative for pruritus and rash ?Psychiatric: negative for anxiety, depression, difficulty sleeping and memory loss ? ?MEDICATIONS: ?Current Outpatient Medications  ?Medication Sig Dispense Refill  ? doxycycline (VIBRAMYCIN) 100 MG capsule Take 1 capsule (100 mg total) by mouth 2 (two) times daily for 7 days 14 capsule 0  ? FLUoxetine (PROZAC) 40 MG capsule Take 1 capsule (40 mg total) by mouth once daily 90 capsule 1  ? buPROPion (WELLBUTRIN XL) 150 MG XL tablet Take 150 mg by mouth once daily (Patient not taking: Reported on 08/31/2021)    ? methocarbamoL (ROBAXIN) 500 MG tablet 1/2-1 po qHS prn (Patient not taking: Reported on 08/31/2021) 30 tablet 3  ? ?No current facility-administered medications for this visit.  ? ? ?ALLERGIES: ?Hydrocodone-acetaminophen, Mustard, Sulfa (sulfonamide antibiotics), and Zoloft [sertraline] ? ?PAST MEDICAL HISTORY: ?Past Medical History:  ?Diagnosis Date  ? Acne   ? Anxiety   ? Depression   ? Headache(784.0)   ? ? ?PAST SURGICAL HISTORY: ?Past Surgical History:  ?Procedure Laterality Date  ? LAPAROSCOPIC TUBAL LIGATION Bilateral   ? REPLACEMENT DISC ANTERIOR LUMBAR SPINE Bilateral   ?  ? ?FAMILY HISTORY: ?Family History  ?Problem Relation Age of Onset  ? Depression Mother   ?  Anxiety Mother   ? Fibrocystic breast disease Mother   ? Alcohol abuse Father   ? Depression Father   ? Anxiety Sister   ? Migraines Sister   ? ADD / ADHD Sister   ? Migraines Sister   ? Cancer Paternal Aunt   ?     breast  ? Diabetes Cousin   ? Birth defects Neg Hx   ? Mental retardation Neg Hx   ? Congenital heart disease Neg Hx   ? Myocardial  Infarction (Heart attack) Neg Hx   ? Stroke Neg Hx   ? Thyroid disease Neg Hx   ? Lupus Neg Hx   ? High blood pressure (Hypertension) Neg Hx   ? Hyperlipidemia (Elevated cholesterol) Neg Hx   ? Irregular Heart Beat (Arrhythmia) Neg Hx   ? Pacemaker Neg Hx   ?  ? ?SOCIAL HISTORY: ?Social History  ? ?Socioeconomic History  ? Marital status: Single  ?Tobacco Use  ? Smoking status: Every Day  ?  Packs/day: 0.50  ?  Types: Cigarettes  ? Smokeless tobacco: Never  ?Vaping Use  ? Vaping Use: Never used  ?Substance and Sexual Activity  ? Alcohol use: Not Currently  ? Drug use: Never  ? Sexual activity: Yes  ? ? ?PHYSICAL EXAM: ?Vitals:  ? 08/31/21 1342  ?BP: 105/73  ?Pulse: 83  ? ?Body mass index is 24.53 kg/m?. ?Weight: 68.9 kg (152 lb) (per chart)  ? ?GENERAL: Alert, active, oriented x3 ? ?HEENT: Pupils equal reactive to light. Extraocular movements are intact. Sclera clear. Palpebral conjunctiva normal red color.Pharynx clear. ? ?NECK: Supple with no palpable mass and no adenopathy. ? ?LUNGS: Sound clear with no rales rhonchi or wheezes. ? ?HEART: Regular rhythm S1 and S2 without murmur. ? ?ABDOMEN: Soft and depressible, nontender with no palpable mass, no hepatomegaly. Wounds dry and clean. ? ?BACK: 2 cm area of the right buttock almost at the midline with chronic sinus most likely chronic cavity of an abscess.  No significant cellulitis or purulence. ? ?EXTREMITIES: Well-developed well-nourished symmetrical with no dependent edema. ? ?NEUROLOGICAL: Awake alert oriented, facial expression symmetrical, moving all extremities. ? ?REVIEW OF DATA: ?I have reviewed the following data today: ?Office Visit on 08/02/2021  ?Component Date Value  ? Xpress Strep A, PCR 08/02/2021 Not Detected   ?Office Visit on 07/22/2021  ?Component Date Value  ? Glucose 07/22/2021 71   ? Sodium 07/22/2021 139   ? Potassium 07/22/2021 4.1   ? Chloride 07/22/2021 106   ? Carbon Dioxide (CO2) 07/22/2021 29.0   ? Calcium 07/22/2021 9.5   ? Urea  Nitrogen (BUN) 07/22/2021 11   ? Creatinine 07/22/2021 0.7   ? Glomerular Filtration Ra* 07/22/2021 96   ? BUN/Crea Ratio 07/22/2021 15.7   ? Anion Gap w/K 07/22/2021 8.1   ? Uric Acid 07/22/2021 4.1   ? Sedimentation Rate-Autom* 07/22/2021 6   ? C Reactive Protein - Lab* 07/22/2021 2   ? ANA Direct - [facility] 07/22/2021 Negative   ? RA Latex Turbid. Bonnee Quin* 07/22/2021 <10.0   ?  ? ?ASSESSMENT: ?Ms. Lhommedieu is a 31 y.o. female presenting for consultation for recurrent infection of epidermal inclusion cyst. The patient has a mass on the right buttock that is causing some pain and discomfort on pressure and recurrent infections. Patient oriented about the diagnosis of epidermal inclusion cyst, not a malignant lesion but if it is causing symptoms, excision can be considered. Patient oriented about the procedure, benefits and risk.  ? ?Cyst  of buttocks [L72.9] ? ?PLAN: ?1.  Excision of right buttock cyst (11404) ?2.  Avoid taking aspirin 5 days before the procedure ?3.  Contact us if you have any concern ? ?Patient verbalized understanding, all questions were answered, and were agreeable with the plan outlined above.  ? ?Henderson Frampton Cintron-Diaz, MD ? ?Electronically signed by Sederick Jacobsen Cintron-Diaz, MD ?

## 2021-09-02 ENCOUNTER — Other Ambulatory Visit
Admission: RE | Admit: 2021-09-02 | Discharge: 2021-09-02 | Disposition: A | Discharge status: Home / Self Care | Payer: Medicaid Other | Source: Ambulatory Visit | Attending: General Surgery | Admitting: General Surgery

## 2021-09-02 ENCOUNTER — Other Ambulatory Visit: Payer: Self-pay

## 2021-09-02 HISTORY — DX: Headache, unspecified: R51.9

## 2021-09-02 NOTE — Patient Instructions (Addendum)
Your procedure is scheduled on: 09/03/21 - Friday ?Report to the Registration Desk on the 1st floor of the [facility]. ?To find out your arrival time, please call [phone] between 1PM - 3PM on: 09/02/21 - Thursday ?If your arrival time is 6:00 am, do not arrive prior to that time as the [facility] entrance doors do not open until 6:00 am. ? ?REMEMBER: ?Instructions that are not followed completely may result in serious medical risk, up to and including death; or upon the discretion of your surgeon and anesthesiologist your surgery may need to be rescheduled. ? ?Do not eat food or drink any fluids after midnight the night before surgery.  ?No gum chewing, lozengers or hard candies. ? ?TAKE THESE MEDICATIONS THE MORNING OF SURGERY WITH A SIP OF WATER: ?- FLUoxetine (PROZAC) 20 MG capsule ? ?One week prior to surgery: ?Stop Anti-inflammatories (NSAIDS) such as Advil, Aleve, Ibuprofen, Motrin, Naproxen, Naprosyn and Aspirin based products such as Excedrin, Goodys Powder, BC Powder. ? ?Stop ANY OVER THE COUNTER supplements until after surgery. ? ?You may take Tylenol if needed for pain up until the day of surgery. ? ?No Alcohol for 24 hours before or after surgery. ? ?No Smoking including e-cigarettes for 24 hours prior to surgery.  ?No chewable tobacco products for at least 6 hours prior to surgery.  ?No nicotine patches on the day of surgery. ? ?Do not use any "recreational" drugs for at least a week prior to your surgery.  ?Please be advised that the combination of cocaine and anesthesia may have negative outcomes, up to and including death. ?If you test positive for cocaine, your surgery will be cancelled. ? ?On the morning of surgery brush your teeth with toothpaste and water, you may rinse your mouth with mouthwash if you wish. ?Do not swallow any toothpaste or mouthwash. ? ?Do not wear jewelry, make-up, hairpins, clips or nail polish. ? ?Do not wear lotions, powders, or perfumes.  ? ?Do not shave body  from the neck down 48 hours prior to surgery just in case you cut yourself which could leave a site for infection.  ?Also, freshly shaved skin may become irritated if using the CHG soap. ? ?Contact lenses, hearing aids and dentures may not be worn into surgery. ? ?Do not bring valuables to the hospital. [facility] is not responsible for any missing/lost belongings or valuables.  ? ?Notify your doctor if there is any change in your medical condition (cold, fever, infection). ? ?Wear comfortable clothing (specific to your surgery type) to the hospital. ? ?After surgery, you can help prevent lung complications by doing breathing exercises.  ?Take deep breaths and cough every 1-2 hours. Your doctor may order a device called an Incentive Spirometer to help you take deep breaths. ?When coughing or sneezing, hold a pillow firmly against your incision with both hands. This is called ?splinting.? Doing this helps protect your incision. It also decreases belly discomfort. ? ?If you are being admitted to the hospital overnight, leave your suitcase in the car. ?After surgery it may be brought to your room. ? ?If you are being discharged the day of surgery, you will not be allowed to drive home. ?You will need a responsible adult (18 years or older) to drive you home and stay with you that night.  ? ?If you are taking public transportation, you will need to have a responsible adult (18 years or older) with you. ?Please confirm with your physician that it is acceptable to use  public transportation.  ? ?Please call the Pre-admissions Testing Dept. at [phone] if you have any questions about these instructions. ? ?Surgery Visitation Policy: ? ?Patients undergoing a surgery or procedure may have two family members or support persons with them as long as the person is not COVID-19 positive or experiencing its symptoms.  ? ?Inpatient Visitation:   ? ?Visiting hours are 7 a.m. to 8 p.m. ?Up to four visitors are allowed at one  time in a patient room, including children. The visitors may rotate out with other people during the day. One designated support person (adult) may remain overnight.  ?

## 2021-09-03 ENCOUNTER — Other Ambulatory Visit: Payer: Self-pay

## 2021-09-03 ENCOUNTER — Ambulatory Visit: Payer: Medicaid Other | Admitting: Certified Registered Nurse Anesthetist

## 2021-09-03 ENCOUNTER — Encounter: Payer: Self-pay | Admitting: General Surgery

## 2021-09-03 ENCOUNTER — Ambulatory Visit
Admission: RE | Admit: 2021-09-03 | Discharge: 2021-09-03 | Disposition: A | Discharge status: Home / Self Care | Payer: Medicaid Other | Source: Ambulatory Visit | Attending: General Surgery | Admitting: General Surgery

## 2021-09-03 ENCOUNTER — Encounter
Admission: RE | Disposition: A | Discharge status: Home / Self Care | Payer: Self-pay | Source: Ambulatory Visit | Attending: General Surgery

## 2021-09-03 DIAGNOSIS — L0231 Cutaneous abscess of buttock: Secondary | ICD-10-CM | POA: Insufficient documentation

## 2021-09-03 DIAGNOSIS — R222 Localized swelling, mass and lump, trunk: Secondary | ICD-10-CM | POA: Diagnosis present

## 2021-09-03 DIAGNOSIS — F419 Anxiety disorder, unspecified: Secondary | ICD-10-CM | POA: Insufficient documentation

## 2021-09-03 DIAGNOSIS — F17200 Nicotine dependence, unspecified, uncomplicated: Secondary | ICD-10-CM | POA: Insufficient documentation

## 2021-09-03 DIAGNOSIS — Z79899 Other long term (current) drug therapy: Secondary | ICD-10-CM | POA: Insufficient documentation

## 2021-09-03 HISTORY — PX: MASS EXCISION: SHX2000

## 2021-09-03 LAB — POCT PREGNANCY, URINE: Preg Test, Ur: NEGATIVE

## 2021-09-03 SURGERY — EXCISION MASS
Anesthesia: General | Site: Buttocks | Laterality: Right | Wound class: Clean

## 2021-09-03 MED ORDER — ONDANSETRON HCL 4 MG/2ML IJ SOLN
INTRAMUSCULAR | Status: AC
  Filled 2021-09-03: qty 2

## 2021-09-03 MED ORDER — FENTANYL CITRATE (PF) 100 MCG/2ML IJ SOLN: 25.0000 ug | INTRAMUSCULAR | Status: DC | PRN

## 2021-09-03 MED ORDER — MIDAZOLAM HCL 2 MG/2ML IJ SOLN
INTRAMUSCULAR | Status: AC
  Filled 2021-09-03: qty 2

## 2021-09-03 MED ORDER — CHLORHEXIDINE GLUCONATE 0.12 % MT SOLN
15.0000 mL | Freq: Once | OROMUCOSAL | Status: AC
  Administered 2021-09-03: 15 mL via OROMUCOSAL

## 2021-09-03 MED ORDER — OXYCODONE-ACETAMINOPHEN 5-325 MG PO TABS: 1.0000 | ORAL_TABLET | ORAL | 0 refills | Status: AC | PRN

## 2021-09-03 MED ORDER — ORAL CARE MOUTH RINSE: 15.0000 mL | Freq: Once | OROMUCOSAL | Status: AC

## 2021-09-03 MED ORDER — BUPIVACAINE-EPINEPHRINE 0.5% -1:200000 IJ SOLN
INTRAMUSCULAR | Status: DC | PRN
  Administered 2021-09-03: 20 mL

## 2021-09-03 MED ORDER — PROPOFOL 500 MG/50ML IV EMUL
INTRAVENOUS | Status: DC | PRN
  Administered 2021-09-03: 50 ug/kg/min via INTRAVENOUS

## 2021-09-03 MED ORDER — LACTATED RINGERS IV SOLN: INTRAVENOUS | Status: DC

## 2021-09-03 MED ORDER — CHLORHEXIDINE GLUCONATE 0.12 % MT SOLN
OROMUCOSAL | Status: AC
  Filled 2021-09-03: qty 15

## 2021-09-03 MED ORDER — KETOROLAC TROMETHAMINE 30 MG/ML IJ SOLN
INTRAMUSCULAR | Status: AC
  Filled 2021-09-03: qty 1

## 2021-09-03 MED ORDER — OXYCODONE HCL 5 MG/5ML PO SOLN: 5.0000 mg | Freq: Once | ORAL | Status: DC | PRN

## 2021-09-03 MED ORDER — KETOROLAC TROMETHAMINE 30 MG/ML IJ SOLN
INTRAMUSCULAR | Status: DC | PRN
  Administered 2021-09-03: 30 mg via INTRAVENOUS

## 2021-09-03 MED ORDER — OXYCODONE HCL 5 MG PO TABS: 5.0000 mg | ORAL_TABLET | Freq: Once | ORAL | Status: DC | PRN

## 2021-09-03 MED ORDER — MIDAZOLAM HCL 2 MG/2ML IJ SOLN
INTRAMUSCULAR | Status: DC | PRN
  Administered 2021-09-03: 2 mg via INTRAVENOUS

## 2021-09-03 MED ORDER — LACTATED RINGERS IV SOLN
INTRAVENOUS | Status: DC
Start: 2021-09-03 — End: 2021-09-03

## 2021-09-03 MED ORDER — FENTANYL CITRATE (PF) 100 MCG/2ML IJ SOLN
INTRAMUSCULAR | Status: AC
  Filled 2021-09-03: qty 2

## 2021-09-03 MED ORDER — FAMOTIDINE 20 MG PO TABS
20.0000 mg | ORAL_TABLET | Freq: Once | ORAL | Status: AC
  Administered 2021-09-03: 20 mg via ORAL

## 2021-09-03 MED ORDER — FAMOTIDINE 20 MG PO TABS
ORAL_TABLET | ORAL | Status: AC
  Filled 2021-09-03: qty 1

## 2021-09-03 MED ORDER — PROPOFOL 500 MG/50ML IV EMUL
INTRAVENOUS | Status: AC
  Filled 2021-09-03: qty 50

## 2021-09-03 MED ORDER — BUPIVACAINE-EPINEPHRINE (PF) 0.5% -1:200000 IJ SOLN
INTRAMUSCULAR | Status: AC
  Filled 2021-09-03: qty 30

## 2021-09-03 MED ORDER — ACETAMINOPHEN 10 MG/ML IV SOLN: 1000.0000 mg | Freq: Once | INTRAVENOUS | Status: DC | PRN

## 2021-09-03 MED ORDER — ONDANSETRON HCL 4 MG/2ML IJ SOLN
4.0000 mg | Freq: Once | INTRAMUSCULAR | Status: AC | PRN
  Administered 2021-09-03: 4 mg via INTRAVENOUS

## 2021-09-03 MED ORDER — FENTANYL CITRATE (PF) 100 MCG/2ML IJ SOLN
INTRAMUSCULAR | Status: DC | PRN
Start: 2021-09-03 — End: 2021-09-03
  Administered 2021-09-03 (×2): 50 ug via INTRAVENOUS

## 2021-09-03 MED ORDER — PROPOFOL 10 MG/ML IV BOLUS
INTRAVENOUS | Status: DC | PRN
  Administered 2021-09-03: 10 mg via INTRAVENOUS
  Administered 2021-09-03 (×2): 20 mg via INTRAVENOUS

## 2021-09-03 SURGICAL SUPPLY — 35 items
ADH SKN CLS APL DERMABOND .7 (GAUZE/BANDAGES/DRESSINGS) ×1
APL PRP STRL LF DISP 70% ISPRP (MISCELLANEOUS) ×1
BLADE SURG 15 STRL LF DISP TIS (BLADE) ×1 IMPLANT
BLADE SURG 15 STRL SS (BLADE) ×2
CHLORAPREP W/TINT 26 (MISCELLANEOUS) ×2 IMPLANT
CNTNR SPEC 2.5X3XGRAD LEK (MISCELLANEOUS) ×1
CONT SPEC 4OZ STER OR WHT (MISCELLANEOUS) ×1
CONT SPEC 4OZ STRL OR WHT (MISCELLANEOUS) ×1
CONTAINER SPEC 2.5X3XGRAD LEK (MISCELLANEOUS) ×1 IMPLANT
DERMABOND ADVANCED (GAUZE/BANDAGES/DRESSINGS) ×1
DERMABOND ADVANCED .7 DNX12 (GAUZE/BANDAGES/DRESSINGS) ×1 IMPLANT
DRAPE LAPAROTOMY 100X77 ABD (DRAPES) ×2 IMPLANT
DRAPE UNDER BUTTOCK W/FLU (DRAPES) IMPLANT
ELECT REM PT RETURN 9FT ADLT (ELECTROSURGICAL) ×2
ELECTRODE REM PT RTRN 9FT ADLT (ELECTROSURGICAL) ×1 IMPLANT
GAUZE 4X4 16PLY ~~LOC~~+RFID DBL (SPONGE) ×2 IMPLANT
GLOVE BIO SURGEON STRL SZ 6.5 (GLOVE) ×2 IMPLANT
GLOVE BIOGEL PI IND STRL 6.5 (GLOVE) ×1 IMPLANT
GLOVE BIOGEL PI INDICATOR 6.5 (GLOVE) ×1
KIT TURNOVER KIT A (KITS) ×2 IMPLANT
LABEL OR SOLS (LABEL) ×2 IMPLANT
MANIFOLD NEPTUNE II (INSTRUMENTS) ×2 IMPLANT
MARGIN MAP 10MM (MISCELLANEOUS) ×2 IMPLANT
NDL HYPO 25X1 1.5 SAFETY (NEEDLE) ×1 IMPLANT
NEEDLE HYPO 25X1 1.5 SAFETY (NEEDLE) ×2 IMPLANT
NS IRRIG 500ML POUR BTL (IV SOLUTION) ×2 IMPLANT
PACK BASIN MINOR ARMC (MISCELLANEOUS) ×2 IMPLANT
SUT ETHILON 3-0 (SUTURE) ×2 IMPLANT
SUT MNCRL 4-0 (SUTURE) ×2
SUT MNCRL 4-0 27XMFL (SUTURE) ×1
SUT VIC AB 2-0 SH 27 (SUTURE) ×2
SUT VIC AB 2-0 SH 27XBRD (SUTURE) ×1 IMPLANT
SUTURE MNCRL 4-0 27XMF (SUTURE) ×1 IMPLANT
SYR 10ML LL (SYRINGE) ×2 IMPLANT
WATER STERILE IRR 500ML POUR (IV SOLUTION) ×2 IMPLANT

## 2021-09-03 NOTE — Interval H&P Note (Signed)
History and Physical Interval Note: ? ?09/03/2021 ?11:25 AM ? ?Veronica Harmon  has presented today for surgery, with the diagnosis of Right buttocks cyst.  The various methods of treatment have been discussed with the patient and family. After consideration of risks, benefits and other options for treatment, the patient has consented to  Procedure(s): ?EXCISION MASS (Right) as a surgical intervention.  The patient's history has been reviewed, patient examined, no change in status, stable for surgery.  I have reviewed the patient's chart and labs.  Questions were answered to the patient's satisfaction.   ? ? ?Modine Oppenheimer Cintron-Diaz ? ? ?

## 2021-09-03 NOTE — Transfer of Care (Signed)
Immediate Anesthesia Transfer of Care Note ? ?Patient: Veronica Harmon ? ?Procedure(s) Performed: EXCISION MASS (Right: Buttocks) ? ?Patient Location: PACU ? ?Anesthesia Type:General ? ?Level of Consciousness: awake, alert  and oriented ? ?Airway & Oxygen Therapy: Patient Spontanous Breathing and Patient connected to face mask oxygen ? ?Post-op Assessment: Report given to RN and Post -op Vital signs reviewed and stable ? ?Post vital signs: Reviewed and stable ? ?Last Vitals:  ?Vitals Value Taken Time  ?BP 119/75 09/03/21 1225  ?Temp    ?Pulse 69 09/03/21 1226  ?Resp 10 09/03/21 1226  ?SpO2 99 % 09/03/21 1226  ?Vitals shown include unvalidated device data. ? ?Last Pain:  ?Vitals:  ? 09/03/21 1050  ?PainSc: 0-No pain  ?   ? ?  ? ?Complications: No notable events documented. ?

## 2021-09-03 NOTE — Anesthesia Postprocedure Evaluation (Signed)
Anesthesia Post Note ? ?Patient: Veronica Harmon ? ?Procedure(s) Performed: EXCISION MASS (Right: Buttocks) ? ?Patient location during evaluation: PACU ?Anesthesia Type: General ?Level of consciousness: awake and alert, oriented and patient cooperative ?Pain management: pain level controlled ?Vital Signs Assessment: post-procedure vital signs reviewed and stable ?Respiratory status: spontaneous breathing, nonlabored ventilation and respiratory function stable ?Cardiovascular status: blood pressure returned to baseline and stable ?Postop Assessment: adequate PO intake ?Anesthetic complications: no ? ? ?No notable events documented. ? ? ?Last Vitals:  ?Vitals:  ? 09/03/21 1245 09/03/21 1251  ?BP: 108/75 103/67  ?Pulse: 69 73  ?Resp: 17 17  ?Temp: (!) 36.2 ?C   ?SpO2: 100% 99%  ?  ?Last Pain:  ?Vitals:  ? 09/03/21 1225  ?PainSc: 0-No pain  ? ? ?  ?  ?  ?  ?  ?  ? ?Darrin Nipper ? ? ? ? ?

## 2021-09-03 NOTE — Discharge Instructions (Addendum)
  Diet: Resume home heart healthy regular diet.   Activity: Increase activity as tolerated. Light activity and walking are encouraged. Do not drive or drink alcohol if taking narcotic pain medications.  Wound care: May shower with soapy water and pat dry (do not rub incisions), but no baths or submerging incision underwater until follow-up. (no swimming)   Medications: Resume all home medications. For mild to moderate pain: acetaminophen (Tylenol) or ibuprofen (if no kidney disease). Combining Tylenol with alcohol can substantially increase your risk of causing liver disease. Narcotic pain medications, if prescribed, can be used for severe pain, though may cause nausea, constipation, and drowsiness. If you do not need the narcotic pain medication, you do not need to fill the prescription.  Call office ([phone]) at any time if any questions, worsening pain, fevers/chills, bleeding, drainage from incision site, or other concerns.  AMBULATORY SURGERY  DISCHARGE INSTRUCTIONS   The drugs that you were given will stay in your system until tomorrow so for the next 24 hours you should not:  Drive an automobile Make any legal decisions Drink any alcoholic beverage   You may resume regular meals tomorrow.  Today it is better to start with liquids and gradually work up to solid foods.  You may eat anything you prefer, but it is better to start with liquids, then soup and crackers, and gradually work up to solid foods.   Please notify your doctor immediately if you have any unusual bleeding, trouble breathing, redness and pain at the surgery site, drainage, fever, or pain not relieved by medication.    Additional Instructions:  Please contact your physician with any problems or Same Day Surgery at [phone], Monday through Friday 6 am to 4 pm, or [facility] Main number at [phone].  

## 2021-09-03 NOTE — Op Note (Signed)
OPERATION REPORT ? ?Pre Operative Diagnosis: Right buttock mass ? ?Post operative diagnosis: Right buttock mass (cyst) ? ?Anesthesia: MAC and Local ?  ?Surgeon: Dr. Windell Moment ?  ?Indication: This 31 y.o. year old female with a soft tissue mass that is causing recurrent infections ?  ?Description of procedure: after orienting patient about the procedure steps and benefits and patient agreed to proceed. Time out was done identifying correct patient and location of procedure. After induction of monitored sedation, local anesthesia was infiltrated around the palpable lesion. With a blade #15, an elliptical incision was made using the skin lines. Sharp dissection was carried down and lesion was excised including dermal tissue. The mass measured 3.5 cm. Deep dermal stitches were done with vicryl 4-0 to repair the laceration and skin closed with Monocryl 4-0 in subcuticular fashion. Specimen sent to pathology.  ?  ?Complications: none ?  ?EBL: minimal ? ?Herbert Pun, MD, FACS ? ?

## 2021-09-03 NOTE — Anesthesia Preprocedure Evaluation (Addendum)
Anesthesia Evaluation  ?Patient identified by MRN, date of birth, ID band ?Patient awake ? ? ? ?Reviewed: ?Allergy & Precautions, NPO status , Patient's Chart, lab work & pertinent test results ? ?History of Anesthesia Complications ?Negative for: history of anesthetic complications ? ?Airway ?Mallampati: I ? ? ?Neck ROM: Full ? ? ? Dental ? ?(+)  ?  ?Pulmonary ?Current Smoker (1 ppd)Patient did not abstain from smoking.,  ?  ?Pulmonary exam normal ?breath sounds clear to auscultation ? ? ? ? ? ? Cardiovascular ?Exercise Tolerance: Good ?negative cardio ROS ?Normal cardiovascular exam ?Rhythm:Regular Rate:Normal ? ? ?  ?Neuro/Psych ? Headaches, PSYCHIATRIC DISORDERS Anxiety Depression S/p c-spine disc removal ?  ? GI/Hepatic ?negative GI ROS,   ?Endo/Other  ?negative endocrine ROS ? Renal/GU ?negative Renal ROS  ? ?  ?Musculoskeletal ? ? Abdominal ?  ?Peds ? Hematology ? ?(+) Blood dyscrasia, anemia ,   ?Anesthesia Other Findings ? ? Reproductive/Obstetrics ? ?  ? ? ? ? ? ? ? ? ? ? ? ? ? ?  ?  ? ? ? ? ? ? ? ?Anesthesia Physical ?Anesthesia Plan ? ?ASA: 2 ? ?Anesthesia Plan: General  ? ?Post-op Pain Management:   ? ?Induction: Intravenous ? ?PONV Risk Score and Plan: 2 and Propofol infusion, TIVA, Treatment may vary due to age or medical condition and Ondansetron ? ?Airway Management Planned: Natural Airway ? ?Additional Equipment:  ? ?Intra-op Plan:  ? ?Post-operative Plan:  ? ?Informed Consent: I have reviewed the patients History and Physical, chart, labs and discussed the procedure including the risks, benefits and alternatives for the proposed anesthesia with the patient or authorized representative who has indicated his/her understanding and acceptance.  ? ? ? ? ? ?Plan Discussed with: CRNA ? ?Anesthesia Plan Comments: (LMA/GETA backup discussed.  Patient consented for risks of anesthesia including but not limited to:  ?- adverse reactions to medications ?- damage to eyes,  teeth, lips or other oral mucosa ?- nerve damage due to positioning  ?- sore throat or hoarseness ?- damage to heart, brain, nerves, lungs, other parts of body or loss of life ? ?Informed patient about role of CRNA in peri- and intra-operative care.  Patient voiced understanding.)  ? ? ? ? ? ? ?Anesthesia Quick Evaluation ? ?

## 2021-09-06 LAB — SURGICAL PATHOLOGY

## 2022-02-13 IMAGING — CT CT ANGIO CHEST
2 of 6 series · 19 of 46 positions shown · IV contrast (APPLIED)
Comparison: None.

CLINICAL DATA: Postpartum right chest pain

EXAM:
CT ANGIOGRAPHY CHEST WITH CONTRAST
TECHNIQUE: Multidetector CT imaging of the chest was performed using the
standard protocol during bolus administration of intravenous
contrast. Multiplanar CT image reconstructions and MIPs were
obtained to evaluate the vascular anatomy.
CONTRAST:  75mL OMNIPAQUE IOHEXOL 350 MG/ML SOLN

[Series 6: thins · axial · 0.69mm/px · z∈[-118,+138]mm · 16 of 282 slices shown]
[im 13/282  lung]
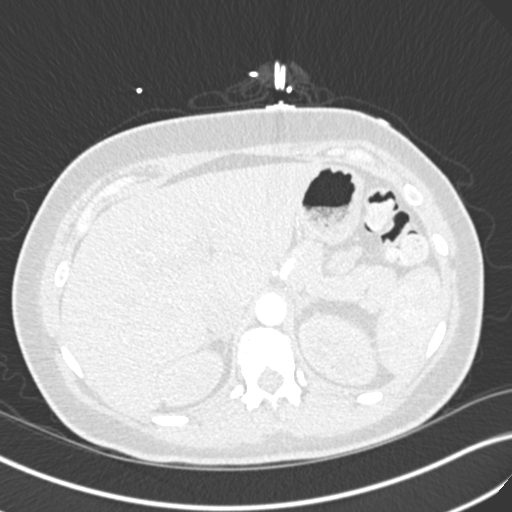
[im 37/282  soft-tissue]
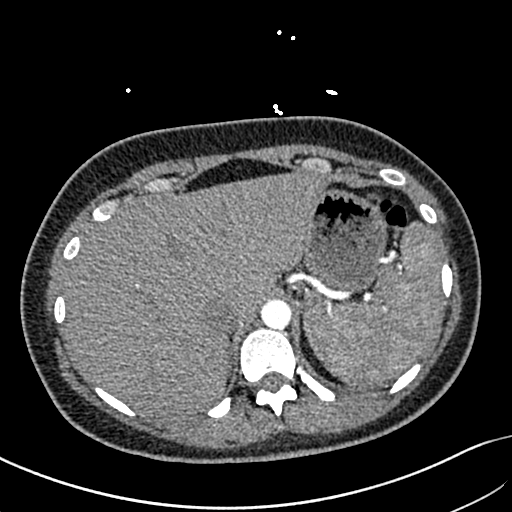
[im 49/282  lung]
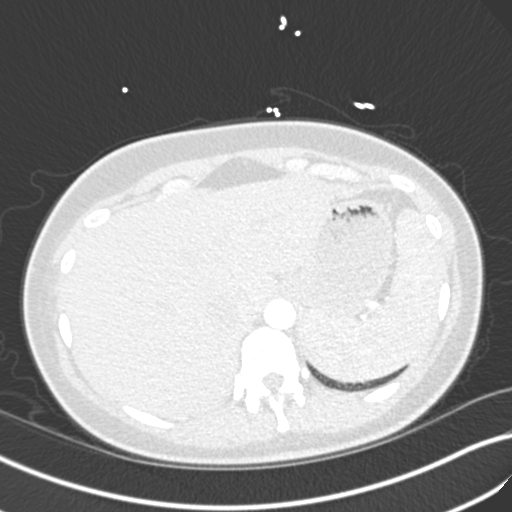
[im 62/282  soft-tissue]
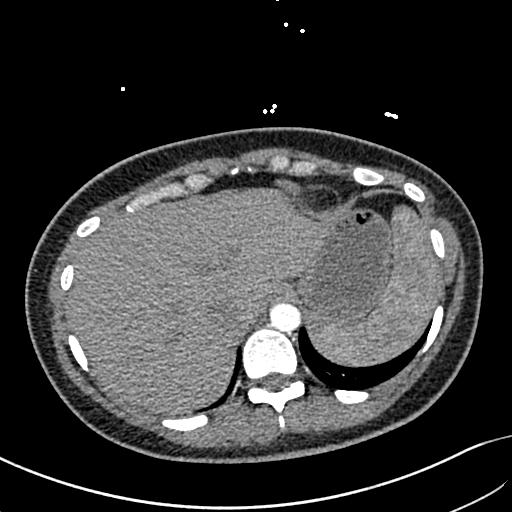
[im 86/282  lung]
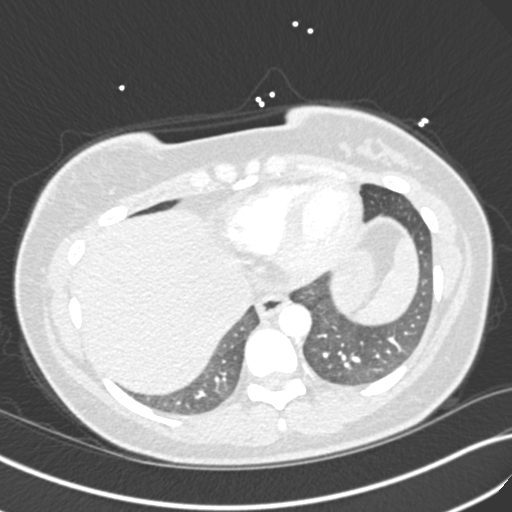
[im 98/282  soft-tissue]
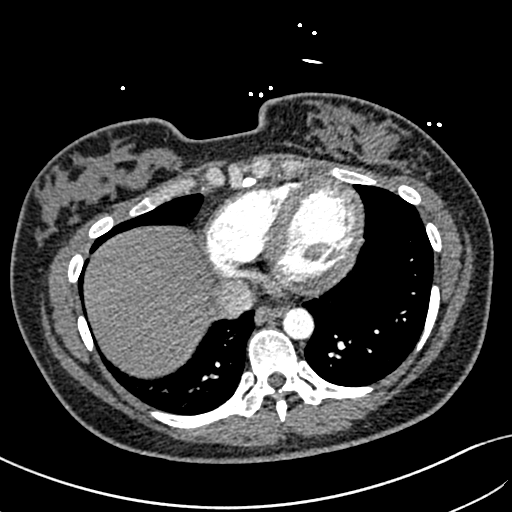
[im 110/282  lung]
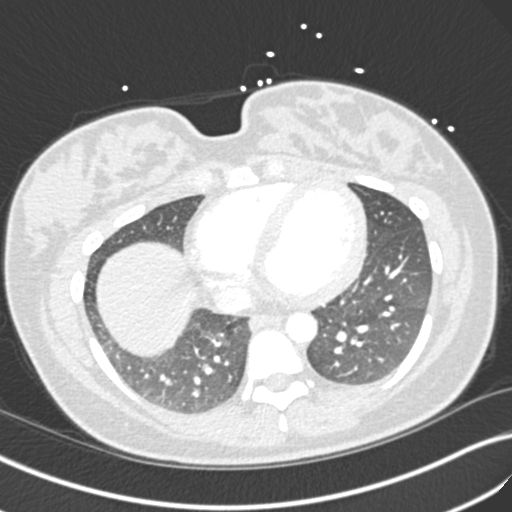
[im 135/282  soft-tissue]
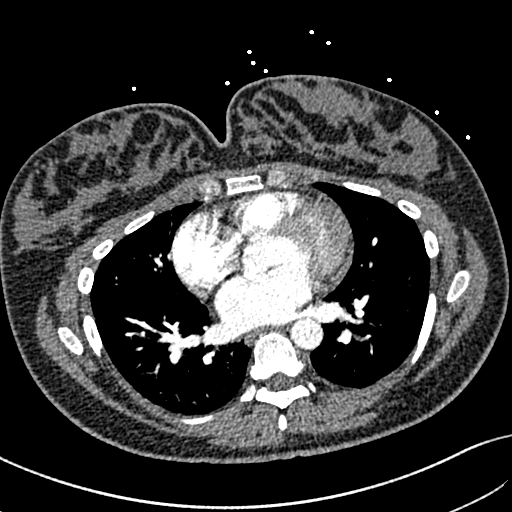
[im 147/282  lung]
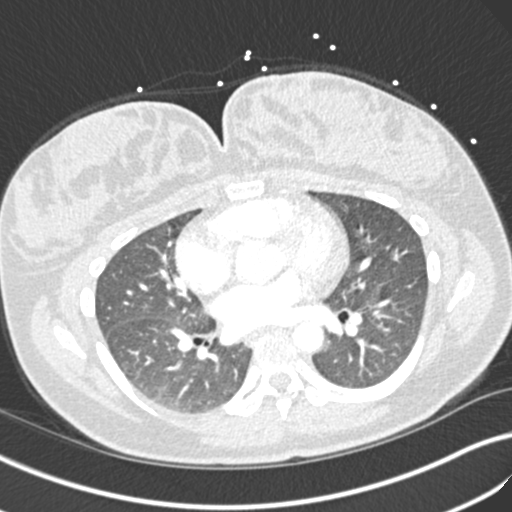
[im 172/282  soft-tissue]
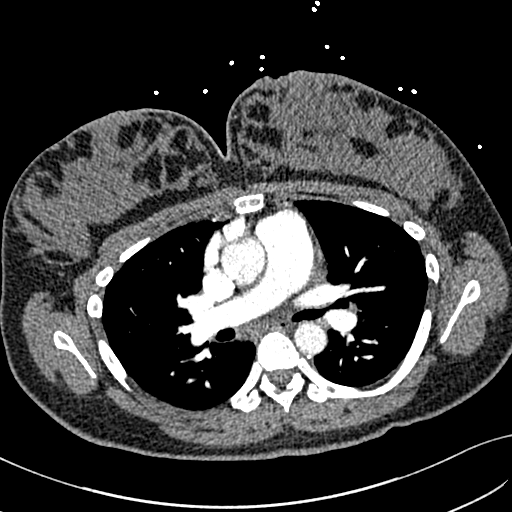
[im 184/282  lung]
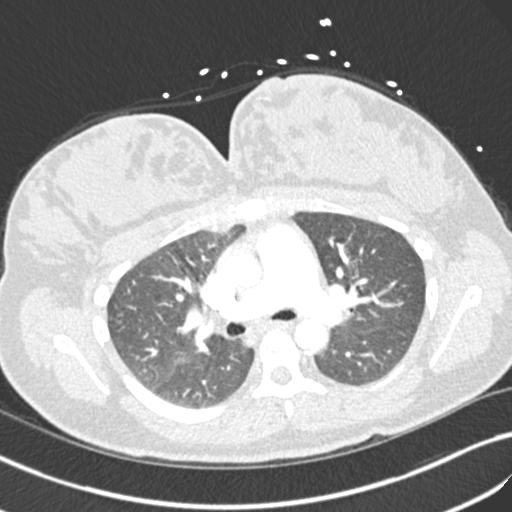
[im 196/282  soft-tissue]
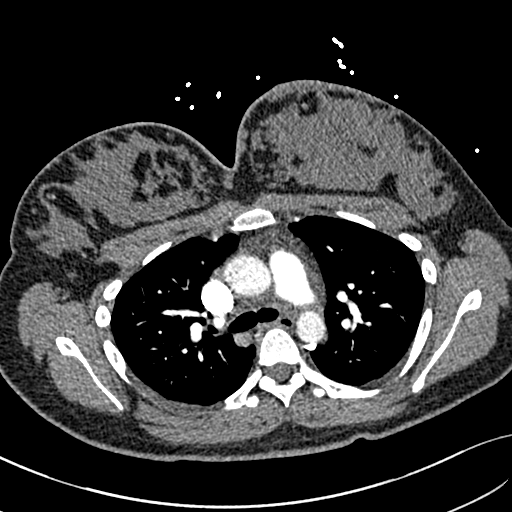
[im 220/282  lung]
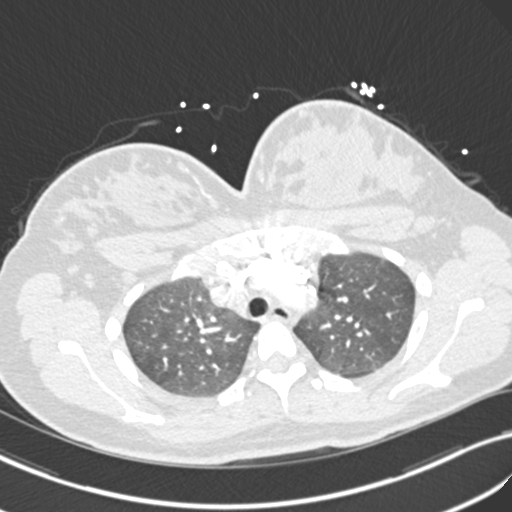
[im 233/282  soft-tissue]
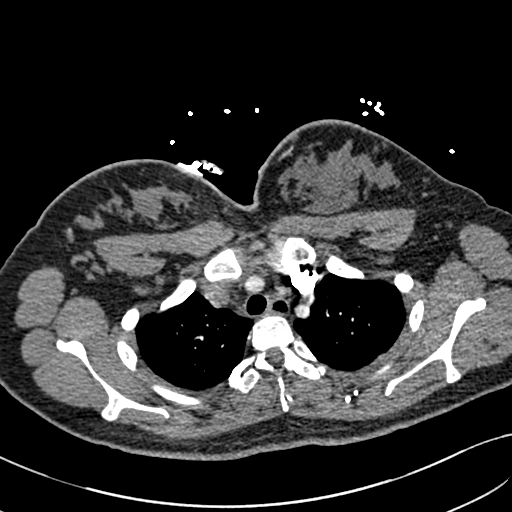
[im 245/282  lung]
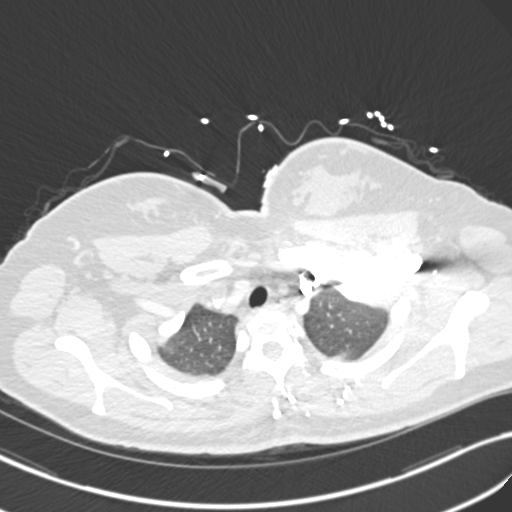
[im 269/282  soft-tissue]
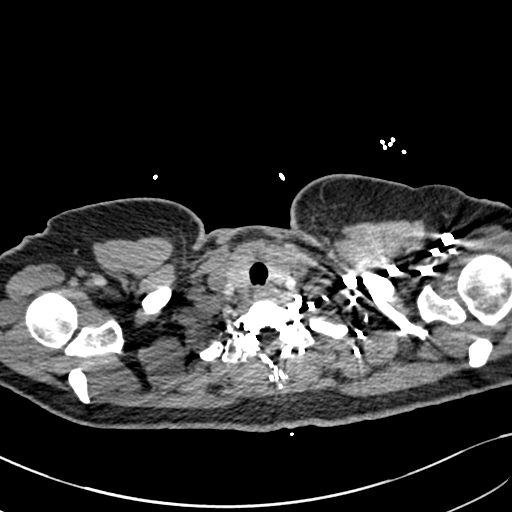

[Series 8: coronal mpr · coronal · 0.55mm/px · 3 of 81 slices shown]
[im 21/81  soft-tissue]
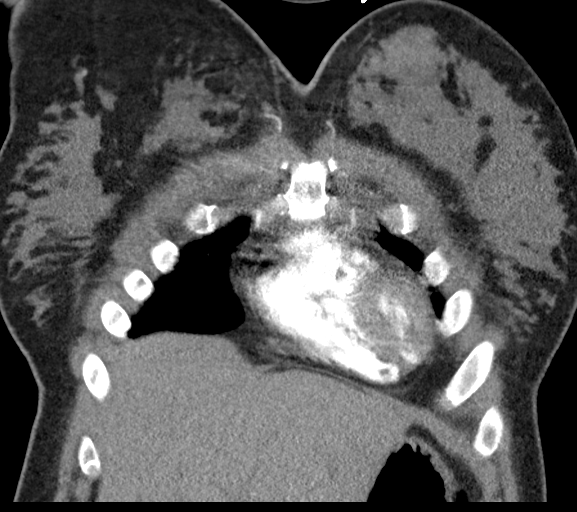
[im 41/81  soft-tissue]
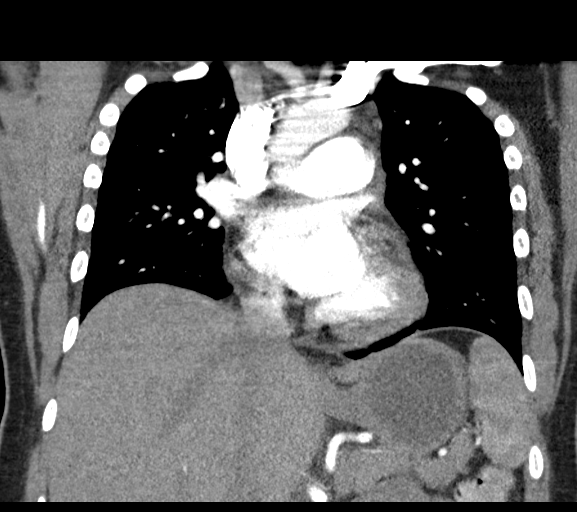
[im 61/81  soft-tissue]
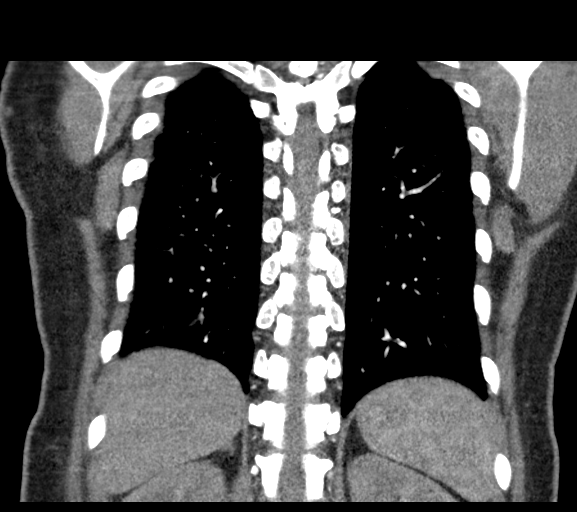

[19 of 46 positions shown; findings below may reference images not displayed]

FINDINGS: Cardiovascular: There is adequate opacification of the pulmonary
arterial tree. No intraluminal filling defect identified to suggest
acute pulmonary embolism. The central pulmonary arteries are of
normal caliber. Cardiac size within normal limits. No significant
coronary artery calcification. No pericardial effusion. The thoracic
aorta is unremarkable.

Mediastinum/Nodes: No pathologic thoracic adenopathy. Thyroid
unremarkable. Residual thymic tissue within the anterior
mediastinum. Esophagus unremarkable.

Lungs/Pleura: Lungs are clear. No pleural effusion or pneumothorax.

Upper Abdomen: No acute abnormality.

Musculoskeletal: No chest wall abnormality. No acute or significant
osseous findings.

Review of the MIP images confirms the above findings.
IMPRESSION: No pulmonary embolism.  No acute intrathoracic pathology identified.

## 2023-07-16 ENCOUNTER — Emergency Department
Admission: EM | Admit: 2023-07-16 | Discharge: 2023-07-16 | Disposition: A | Discharge status: Home / Self Care | Attending: Emergency Medicine | Admitting: Emergency Medicine

## 2023-07-16 ENCOUNTER — Emergency Department

## 2023-07-16 ENCOUNTER — Encounter: Payer: Self-pay | Admitting: Intensive Care

## 2023-07-16 ENCOUNTER — Other Ambulatory Visit: Payer: Self-pay

## 2023-07-16 DIAGNOSIS — G43909 Migraine, unspecified, not intractable, without status migrainosus: Secondary | ICD-10-CM | POA: Insufficient documentation

## 2023-07-16 DIAGNOSIS — R519 Headache, unspecified: Secondary | ICD-10-CM | POA: Diagnosis present

## 2023-07-16 LAB — CBC WITH DIFFERENTIAL/PLATELET
Abs Immature Granulocytes: 0.02 10*3/uL (ref 0.00–0.07)
Basophils Absolute: 0.1 10*3/uL (ref 0.0–0.1)
Basophils Relative: 1 %
Eosinophils Absolute: 0.1 10*3/uL (ref 0.0–0.5)
Eosinophils Relative: 1 %
HCT: 39.1 % (ref 36.0–46.0)
Hemoglobin: 13.1 g/dL (ref 12.0–15.0)
Immature Granulocytes: 0 %
Lymphocytes Relative: 31 %
Lymphs Abs: 1.9 10*3/uL (ref 0.7–4.0)
MCH: 28.5 pg (ref 26.0–34.0)
MCHC: 33.5 g/dL (ref 30.0–36.0)
MCV: 85.2 fL (ref 80.0–100.0)
Monocytes Absolute: 0.5 10*3/uL (ref 0.1–1.0)
Monocytes Relative: 8 %
Neutro Abs: 3.7 10*3/uL (ref 1.7–7.7)
Neutrophils Relative %: 59 %
Platelets: 358 10*3/uL (ref 150–400)
RBC: 4.59 MIL/uL (ref 3.87–5.11)
RDW: 12.4 % (ref 11.5–15.5)
WBC: 6.3 10*3/uL (ref 4.0–10.5)
nRBC: 0 % (ref 0.0–0.2)

## 2023-07-16 LAB — COMPREHENSIVE METABOLIC PANEL
ALT: 12 U/L (ref 0–44)
AST: 15 U/L (ref 15–41)
Albumin: 3.9 g/dL (ref 3.5–5.0)
Alkaline Phosphatase: 97 U/L (ref 38–126)
Anion gap: 8 (ref 5–15)
BUN: 7 mg/dL (ref 6–20)
CO2: 21 mmol/L — ABNORMAL LOW (ref 22–32)
Calcium: 8.6 mg/dL — ABNORMAL LOW (ref 8.9–10.3)
Chloride: 106 mmol/L (ref 98–111)
Creatinine, Ser: 0.66 mg/dL (ref 0.44–1.00)
GFR, Estimated: >60 mL/min (ref >60)
Glucose, Bld: 98 mg/dL (ref 70–99)
Potassium: 3.9 mmol/L (ref 3.5–5.1)
Sodium: 135 mmol/L (ref 135–145)
Total Bilirubin: 1.1 mg/dL (ref 0.0–1.2)
Total Protein: 6.9 g/dL (ref 6.5–8.1)

## 2023-07-16 MED ORDER — KETOROLAC TROMETHAMINE 30 MG/ML IJ SOLN
15.0000 mg | Freq: Once | INTRAMUSCULAR | Status: AC
  Administered 2023-07-16: 15 mg via INTRAVENOUS
  Filled 2023-07-16: qty 1

## 2023-07-16 MED ORDER — METOCLOPRAMIDE HCL 10 MG PO TABS: 10.0000 mg | ORAL_TABLET | Freq: Four times a day (QID) | ORAL | 0 refills | Status: AC | PRN

## 2023-07-16 MED ORDER — PROCHLORPERAZINE EDISYLATE 10 MG/2ML IJ SOLN
10.0000 mg | Freq: Once | INTRAMUSCULAR | Status: AC
  Administered 2023-07-16: 10 mg via INTRAVENOUS
  Filled 2023-07-16: qty 2

## 2023-07-16 MED ORDER — KETOROLAC TROMETHAMINE 10 MG PO TABS: 10.0000 mg | ORAL_TABLET | Freq: Four times a day (QID) | ORAL | 0 refills | Status: AC | PRN

## 2023-07-16 MED ORDER — SODIUM CHLORIDE 0.9 % IV BOLUS
1000.0000 mL | Freq: Once | INTRAVENOUS | Status: AC
  Administered 2023-07-16: 1000 mL via INTRAVENOUS

## 2023-07-16 NOTE — Discharge Instructions (Signed)
 Take the prescribed medication if headache returns.  Follow up with neurology. Call tomorrow for an appointment.  Return to the ER for symptoms of concern if unable to see primary care or the neurologist.

## 2023-07-16 NOTE — ED Provider Notes (Signed)
 [facility] Provider Note    Event Date/Time   First MD Initiated Contact with Patient 07/16/23 1329     (approximate)   History   Migraine   HPI  Veronica Harmon is a 33 y.o. female  with history of infrequent migraine, 2-3 days of generalized headache per week, anxiety, and as listed in EMR presents to the emergency department for evaluation of right side focal headache that started around 2-3am. This headache is different than her typical migraine/generalized headache because her head is tender around the temple and area above the ear. She reports her right eye appears as though it has a "film" over it. 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 She took tylenol at 2-3am and ibuprofen around 10am without relief. She is also experiencing nausea, but no vomiting. No recent illness. No known fever. No weakness in extremities. No neck pain/stiffness.    Physical Exam   Triage Vital Signs: ED Triage Vitals  Encounter Vitals Group     BP 07/16/23 1251 (!) 127/97     Systolic BP Percentile --      Diastolic BP Percentile --      Pulse Rate 07/16/23 1251 96     Resp 07/16/23 1251 17     Temp 07/16/23 1251 97.9 F (36.6 C)     Temp Source 07/16/23 1251 Oral     SpO2 07/16/23 1251 100 %     Weight 07/16/23 1246 180 lb (81.6 kg)     Height 07/16/23 1246 5\' 6"  (1.676 m)     Head Circumference --      Peak Flow --      Pain Score 07/16/23 1246 6     Pain Loc --      Pain Education --      Exclude from Growth Chart --     Most recent vital signs: Vitals:   07/16/23 1251  BP: (!) 127/97  Pulse: 96  Resp: 17  Temp: 97.9 F (36.6 C)  SpO2: 100%    General: Awake, no distress.  CV:  Good peripheral perfusion.  Resp:  Normal effort.  Abd:  No distention.  Other:  Temporal area on right side tender to touch extending to area above ear (area about the size of her  palm). Right TM normal.   ED Results / Procedures / Treatments   Labs (all labs ordered are listed, but only abnormal results are displayed) Labs Reviewed  COMPREHENSIVE METABOLIC PANEL - Abnormal; Notable for the following components:      Result Value   CO2 21 (*)    Calcium 8.6 (*)    All other components within normal limits  CBC WITH DIFFERENTIAL/PLATELET     EKG  Not indicated.   RADIOLOGY  Image and radiology report reviewed and interpreted by me. Radiology report consistent with the same.  CT head shows small volume, frothy secretions in the sinus on the left. Otherwise no acute findings.  PROCEDURES:  Critical Care performed: No  Procedures   MEDICATIONS ORDERED IN ED:  Medications  sodium chloride 0.9 % bolus 1,000 mL (0 mLs Intravenous Stopped 07/16/23 1450)  prochlorperazine (COMPAZINE) injection 10 mg (10 mg Intravenous Given 07/16/23 1352)  ketorolac (TORADOL) 30 MG/ML injection 15 mg (15 mg Intravenous Given 07/16/23 1350)     IMPRESSION / MDM / ASSESSMENT AND PLAN / ED COURSE   I have reviewed  the triage note.  Differential diagnosis includes, but is not limited to, migraine, tension headache, temporal arteritis.  Patient's presentation is most consistent with acute presentation with potential threat to life or bodily function.  33 year old female presenting to the emergency department for treatment and evaluation of headache. See HPI for details.  Neurological status is without abnormality. She is experiencing photophobia and nausea. Plan will be to get CT results, review lab studies, and order migraine cocktail. Patient agreeable to the plan.  CT shows possible sinusitis, but on the left side. She is asymptomatic and that is not likely the cause of her headache.  Labs are unremarkable. Patient reports resolution of the vision change and headache is now mild. She would like to go home so she can get some rest. Medications sent to her pharmacy  and medication teaching completed. She was advised to call and schedule an appointment with neurology due to number of headache days per month. ER return precautions discussed.      FINAL CLINICAL IMPRESSION(S) / ED DIAGNOSES   Final diagnoses:  Migraine without status migrainosus, not intractable, unspecified migraine type     Rx / DC Orders   ED Discharge Orders          Ordered    ketorolac (TORADOL) 10 MG tablet  Every 6 hours PRN       Note to Pharmacy: Injection given in ER   07/16/23 1437    metoCLOPramide (REGLAN) 10 MG tablet  Every 6 hours PRN        07/16/23 1437             Note:  This document was prepared using Dragon voice recognition software and may include unintentional dictation errors.   Chinita Pester, FNP 07/16/23 1536    Jene Every, MD 07/16/23 [id number]

## 2023-07-16 NOTE — ED Triage Notes (Signed)
 Patient c/o migraine that started through the night. Reports history of migraines but this one feels different. Reports right sided blurry vision since 0200-0300 this AM.

## 2023-10-01 ENCOUNTER — Other Ambulatory Visit: Payer: Self-pay

## 2023-10-01 ENCOUNTER — Emergency Department
Admission: EM | Admit: 2023-10-01 | Discharge: 2023-10-01 | Disposition: A | Discharge status: Home / Self Care | Attending: Emergency Medicine | Admitting: Emergency Medicine

## 2023-10-01 DIAGNOSIS — S61051A Open bite of right thumb without damage to nail, initial encounter: Secondary | ICD-10-CM | POA: Insufficient documentation

## 2023-10-01 DIAGNOSIS — Z203 Contact with and (suspected) exposure to rabies: Secondary | ICD-10-CM | POA: Insufficient documentation

## 2023-10-01 DIAGNOSIS — W5501XA Bitten by cat, initial encounter: Secondary | ICD-10-CM | POA: Diagnosis not present

## 2023-10-01 DIAGNOSIS — Z23 Encounter for immunization: Secondary | ICD-10-CM | POA: Insufficient documentation

## 2023-10-01 DIAGNOSIS — Z2914 Encounter for prophylactic rabies immune globin: Secondary | ICD-10-CM | POA: Diagnosis not present

## 2023-10-01 MED ORDER — RABIES VACCINE, PCEC IM SUSR
1.0000 mL | Freq: Once | INTRAMUSCULAR | Status: AC
  Administered 2023-10-01: 1 mL via INTRAMUSCULAR
  Filled 2023-10-01: qty 1

## 2023-10-01 MED ORDER — ALPRAZOLAM 0.5 MG PO TABS
0.5000 mg | ORAL_TABLET | Freq: Once | ORAL | Status: AC
  Administered 2023-10-01: 0.5 mg via ORAL
  Filled 2023-10-01: qty 1

## 2023-10-01 MED ORDER — TETANUS-DIPHTH-ACELL PERTUSSIS 5-2.5-18.5 LF-MCG/0.5 IM SUSY
0.5000 mL | PREFILLED_SYRINGE | Freq: Once | INTRAMUSCULAR | Status: AC
  Administered 2023-10-01: 0.5 mL via INTRAMUSCULAR
  Filled 2023-10-01: qty 0.5

## 2023-10-01 MED ORDER — RABIES IMMUNE GLOBULIN 300 UNIT/2ML IJ SOLN
20.0000 [IU]/kg | Freq: Once | INTRAMUSCULAR | Status: AC
  Administered 2023-10-01: 1800 [IU]
  Filled 2023-10-01: qty 2

## 2023-10-01 MED ORDER — FLUCONAZOLE 150 MG PO TABS: 150.0000 mg | ORAL_TABLET | Freq: Once | ORAL | 0 refills | Status: AC

## 2023-10-01 MED ORDER — AMOXICILLIN-POT CLAVULANATE 875-125 MG PO TABS: 1.0000 | ORAL_TABLET | Freq: Two times a day (BID) | ORAL | 0 refills | Status: AC

## 2023-10-01 NOTE — Discharge Instructions (Signed)
 Please go to Urgent Care on June 4, 8, and 15 to complete the rabies series.  Return to the ER for concern of progressive infection of your thumb.

## 2023-10-01 NOTE — ED Provider Notes (Signed)
 [facility] Provider Note    Event Date/Time   First MD Initiated Contact with Patient 10/01/23 [phone]     (approximate)   History   Animal Bite   HPI  Veronica Harmon is a 33 y.o. female with history of anemia during pregnancy, anxiety, depression and as listed in EMR presents to the emergency department for treatment and evaluation after she was bitten by a stray cat yesterday. The cat seemed sick-- bleeding from its bottom and sneezed a large amount of snot. It initially ate from her hand, but when she reached to pick it up, it bit her right thumb and nails poked through her thumbnail. She was not able to catch the cat and doesn't think she'll be able to find it again. Today, thumb is very tender. No redness or swelling.      Physical Exam   Triage Vital Signs: ED Triage Vitals  Encounter Vitals Group     BP 10/01/23 0834 (!) 142/90     Systolic BP Percentile --      Diastolic BP Percentile --      Pulse Rate 10/01/23 0834 90     Resp 10/01/23 0834 18     Temp 10/01/23 0834 98.5 F (36.9 C)     Temp Source 10/01/23 0834 Oral     SpO2 10/01/23 0834 99 %     Weight 10/01/23 0835 185 lb (83.9 kg)     Height 10/01/23 0835 5\' 6"  (1.676 m)     Head Circumference --      Peak Flow --      Pain Score 10/01/23 0835 3     Pain Loc --      Pain Education --      Exclude from Growth Chart --     Most recent vital signs: Vitals:   10/01/23 0834  BP: (!) 142/90  Pulse: 90  Resp: 18  Temp: 98.5 F (36.9 C)  SpO2: 99%    General: Awake, no distress.  CV:  Good peripheral perfusion.  Resp:  Normal effort.  Abd:  No distention.  Other:  Puncture wound to the pad of the right thumb and nailplate   ED Results / Procedures / Treatments   Labs (all labs ordered are listed, but only abnormal results are displayed) Labs Reviewed - No data to display   EKG  Not indicated.   RADIOLOGY  Image and radiology report reviewed and interpreted  by me. Radiology report consistent with the same.  Not indicated.  PROCEDURES:  Critical Care performed: No  Procedures   MEDICATIONS ORDERED IN ED:  Medications  rabies immune globulin (HYPERRAB) injection 1,800 Units (1,800 Units Infiltration Given 10/01/23 1028)  rabies vaccine (RABAVERT) injection 1 mL (1 mL Intramuscular Given 10/01/23 1022)  Tdap (BOOSTRIX) injection 0.5 mL (0.5 mLs Intramuscular Given 10/01/23 1020)  ALPRAZolam  (XANAX ) tablet 0.5 mg (0.5 mg Oral Given 10/01/23 0959)     IMPRESSION / MDM / ASSESSMENT AND PLAN / ED COURSE   I have reviewed the triage note.  Differential diagnosis includes, but is not limited to, cat bite, rabies exposure, cellulitis  Patient's presentation is most consistent with acute illness / injury with system symptoms.  33 year old female presenting to the emergency department after being bitten by a cat yesterday.  See HPI for further details.  Vital signs are overall reassuring.  She is mildly hypertensive but is also anxious.  No concerning symptoms of hypertension.  On exam, she does  have evidence of cat bite to the right thumb.  No erythema or edema at this point.  No lymphangitis.  Thumb is tender with flexion however range of motion is not restricted.  Because the cat is a stray and is unlikely to be found again, rabies series was recommended and patient agreed to proceed.  Tetanus will also be updated today.  Animal control notified and will contact her.  She was encouraged to follow-up at urgent care and given the dates for subsequent rabies vaccines. ER return precautions also discussed.      FINAL CLINICAL IMPRESSION(S) / ED DIAGNOSES   Final diagnoses:  Cat bite, initial encounter  Need for post exposure prophylaxis for rabies     Rx / DC Orders   ED Discharge Orders          Ordered    amoxicillin-clavulanate (AUGMENTIN) 875-125 MG tablet  2 times daily        10/01/23 1010    fluconazole  (DIFLUCAN ) 150 MG  tablet   Once        10/01/23 1010             Note:  This document was prepared using Dragon voice recognition software and may include unintentional dictation errors.   Sherryle Don, FNP 10/01/23 1445    Iver Marker, MD 10/01/23 1547

## 2023-10-01 NOTE — ED Triage Notes (Signed)
 Pt to ED form home with cat bite to her right thumb yesterday morning. Pt reports she tried to pick up a stray cat and it bit and scratched her.

## 2023-10-04 ENCOUNTER — Ambulatory Visit
Admission: EM | Admit: 2023-10-04 | Discharge: 2023-10-04 | Disposition: A | Discharge status: Home / Self Care | Attending: Emergency Medicine | Admitting: Emergency Medicine

## 2023-10-04 ENCOUNTER — Ambulatory Visit: Admission: EM | Admit: 2023-10-04 | Discharge: 2023-10-04 | Disposition: A | Discharge status: Home / Self Care

## 2023-10-04 DIAGNOSIS — Z203 Contact with and (suspected) exposure to rabies: Secondary | ICD-10-CM

## 2023-10-04 DIAGNOSIS — Z23 Encounter for immunization: Secondary | ICD-10-CM

## 2023-10-04 MED ORDER — RABIES VACCINE, PCEC IM SUSR: 1.0000 mL | Freq: Once | INTRAMUSCULAR | Status: DC

## 2023-10-04 NOTE — ED Triage Notes (Signed)
 Patient in office today for rabies shot only for cat bite

## 2023-10-08 ENCOUNTER — Ambulatory Visit
Admission: EM | Admit: 2023-10-08 | Discharge: 2023-10-08 | Disposition: A | Discharge status: Home / Self Care | Attending: Emergency Medicine | Admitting: Emergency Medicine

## 2023-10-08 ENCOUNTER — Ambulatory Visit

## 2023-10-08 DIAGNOSIS — Z23 Encounter for immunization: Secondary | ICD-10-CM

## 2023-10-08 DIAGNOSIS — Z203 Contact with and (suspected) exposure to rabies: Secondary | ICD-10-CM | POA: Diagnosis not present

## 2023-10-08 MED ORDER — RABIES VACCINE, PCEC IM SUSR
1.0000 mL | Freq: Once | INTRAMUSCULAR | Status: AC
  Administered 2023-10-08: 1 mL via INTRAMUSCULAR

## 2023-10-08 NOTE — ED Notes (Signed)
 Patient educated on last dose of rabies vaccine  series in 7 days. Pt was understanding.

## 2023-10-08 NOTE — ED Triage Notes (Signed)
 Pt presents to UC for day 7 of rabies vaccine  series. Denies concerns at this time.

## 2023-10-15 ENCOUNTER — Ambulatory Visit
Admission: EM | Admit: 2023-10-15 | Discharge: 2023-10-15 | Disposition: A | Discharge status: Home / Self Care | Attending: Emergency Medicine | Admitting: Emergency Medicine

## 2023-10-15 DIAGNOSIS — Z203 Contact with and (suspected) exposure to rabies: Secondary | ICD-10-CM

## 2023-10-15 DIAGNOSIS — Z23 Encounter for immunization: Secondary | ICD-10-CM | POA: Diagnosis not present

## 2023-10-15 MED ORDER — RABIES VACCINE, PCEC IM SUSR
1.0000 mL | Freq: Once | INTRAMUSCULAR | Status: AC
  Administered 2023-10-15: 1 mL via INTRAMUSCULAR

## 2023-10-15 NOTE — ED Triage Notes (Signed)
 Patient presents to urgent care for day 14 rabies vaccine  in rabies series.   Tolerated other vaccines well. Denies any other complaints.
